# Patient Record
Sex: Female | Born: 1960
Health system: Southern US, Community
[De-identification: ages and names within clinical notes are randomized; demographics above are authoritative.]

## PROBLEM LIST (undated history)

## (undated) DIAGNOSIS — Z862 Personal history of diseases of the blood and blood-forming organs and certain disorders involving the immune mechanism: Secondary | ICD-10-CM

## (undated) DIAGNOSIS — K219 Gastro-esophageal reflux disease without esophagitis: Secondary | ICD-10-CM

## (undated) DIAGNOSIS — M1990 Unspecified osteoarthritis, unspecified site: Secondary | ICD-10-CM

## (undated) DIAGNOSIS — R7303 Prediabetes: Secondary | ICD-10-CM

## (undated) DIAGNOSIS — I10 Essential (primary) hypertension: Secondary | ICD-10-CM

## (undated) DIAGNOSIS — F4310 Post-traumatic stress disorder, unspecified: Secondary | ICD-10-CM

## (undated) DIAGNOSIS — I2510 Atherosclerotic heart disease of native coronary artery without angina pectoris: Secondary | ICD-10-CM

## (undated) DIAGNOSIS — I209 Angina pectoris, unspecified: Secondary | ICD-10-CM

## (undated) DIAGNOSIS — Z8669 Personal history of other diseases of the nervous system and sense organs: Secondary | ICD-10-CM

## (undated) DIAGNOSIS — M25569 Pain in unspecified knee: Secondary | ICD-10-CM

## (undated) DIAGNOSIS — Z8719 Personal history of other diseases of the digestive system: Secondary | ICD-10-CM

## (undated) DIAGNOSIS — F323 Major depressive disorder, single episode, severe with psychotic features: Secondary | ICD-10-CM

## (undated) DIAGNOSIS — M7542 Impingement syndrome of left shoulder: Secondary | ICD-10-CM

## (undated) DIAGNOSIS — D649 Anemia, unspecified: Secondary | ICD-10-CM

## (undated) DIAGNOSIS — E119 Type 2 diabetes mellitus without complications: Secondary | ICD-10-CM

## (undated) DIAGNOSIS — Z8614 Personal history of Methicillin resistant Staphylococcus aureus infection: Secondary | ICD-10-CM

## (undated) HISTORY — PX: UPPER GI ENDOSCOPY: SHX6162

## (undated) HISTORY — PX: SHOULDER SURGERY: SHX246

## (undated) HISTORY — PX: COLONOSCOPY: SHX174

## (undated) HISTORY — PX: JOINT REPLACEMENT: SHX530

## (undated) HISTORY — PX: CARDIAC CATHETERIZATION: SHX172

## (undated) HISTORY — PX: OTHER SURGICAL HISTORY: SHX169

## (undated) HISTORY — PX: ABDOMINAL HYSTERECTOMY: SHX81

## (undated) HISTORY — PX: TONSILLECTOMY AND ADENOIDECTOMY: SUR1326

---

## 1999-10-28 HISTORY — PX: TOTAL ABDOMINAL HYSTERECTOMY W/ BILATERAL SALPINGOOPHORECTOMY: SHX83

## 2004-08-21 ENCOUNTER — Emergency Department (HOSPITAL_COMMUNITY): Admission: EM | Admit: 2004-08-21 | Discharge: 2004-08-21 | Payer: Self-pay

## 2004-12-26 ENCOUNTER — Ambulatory Visit (HOSPITAL_COMMUNITY): Admission: RE | Admit: 2004-12-26 | Discharge: 2004-12-26 | Payer: Self-pay | Admitting: Internal Medicine

## 2005-01-09 ENCOUNTER — Emergency Department (HOSPITAL_COMMUNITY): Admission: EM | Admit: 2005-01-09 | Discharge: 2005-01-09 | Payer: Self-pay | Admitting: Emergency Medicine

## 2005-01-29 ENCOUNTER — Ambulatory Visit: Payer: Self-pay | Admitting: Infectious Diseases

## 2005-02-28 ENCOUNTER — Ambulatory Visit: Payer: Self-pay | Admitting: Infectious Diseases

## 2005-02-28 ENCOUNTER — Ambulatory Visit (HOSPITAL_COMMUNITY): Admission: RE | Admit: 2005-02-28 | Discharge: 2005-02-28 | Payer: Self-pay | Admitting: Family Medicine

## 2005-03-04 ENCOUNTER — Ambulatory Visit (HOSPITAL_COMMUNITY): Admission: RE | Admit: 2005-03-04 | Discharge: 2005-03-04 | Payer: Self-pay | Admitting: Infectious Diseases

## 2005-03-26 ENCOUNTER — Ambulatory Visit: Payer: Self-pay | Admitting: Infectious Diseases

## 2005-04-01 ENCOUNTER — Emergency Department (HOSPITAL_COMMUNITY): Admission: EM | Admit: 2005-04-01 | Discharge: 2005-04-01 | Payer: Self-pay | Admitting: Emergency Medicine

## 2005-06-29 ENCOUNTER — Emergency Department (HOSPITAL_COMMUNITY): Admission: EM | Admit: 2005-06-29 | Discharge: 2005-06-29 | Payer: Self-pay | Admitting: Emergency Medicine

## 2007-01-29 ENCOUNTER — Ambulatory Visit: Payer: Self-pay | Admitting: Internal Medicine

## 2007-02-05 ENCOUNTER — Ambulatory Visit: Payer: Self-pay | Admitting: Internal Medicine

## 2007-02-05 ENCOUNTER — Encounter: Payer: Self-pay | Admitting: Internal Medicine

## 2007-02-09 ENCOUNTER — Ambulatory Visit: Payer: Self-pay | Admitting: *Deleted

## 2007-02-10 ENCOUNTER — Ambulatory Visit (HOSPITAL_COMMUNITY): Admission: RE | Admit: 2007-02-10 | Discharge: 2007-02-10 | Payer: Self-pay | Admitting: Family Medicine

## 2007-02-12 ENCOUNTER — Ambulatory Visit: Payer: Self-pay | Admitting: Internal Medicine

## 2007-02-19 ENCOUNTER — Ambulatory Visit: Payer: Self-pay | Admitting: Internal Medicine

## 2007-02-25 ENCOUNTER — Ambulatory Visit: Payer: Self-pay | Admitting: Internal Medicine

## 2007-03-12 ENCOUNTER — Ambulatory Visit: Payer: Self-pay | Admitting: Internal Medicine

## 2007-03-26 ENCOUNTER — Ambulatory Visit: Payer: Self-pay | Admitting: Internal Medicine

## 2007-04-23 ENCOUNTER — Ambulatory Visit: Payer: Self-pay | Admitting: Internal Medicine

## 2007-05-04 ENCOUNTER — Ambulatory Visit: Payer: Self-pay | Admitting: Internal Medicine

## 2007-05-12 ENCOUNTER — Ambulatory Visit: Payer: Self-pay | Admitting: Family Medicine

## 2007-05-12 ENCOUNTER — Ambulatory Visit: Payer: Self-pay | Admitting: Internal Medicine

## 2007-05-18 ENCOUNTER — Emergency Department (HOSPITAL_COMMUNITY): Admission: EM | Admit: 2007-05-18 | Discharge: 2007-05-18 | Payer: Self-pay | Admitting: Emergency Medicine

## 2007-08-09 ENCOUNTER — Ambulatory Visit: Payer: Self-pay | Admitting: Internal Medicine

## 2007-08-11 ENCOUNTER — Ambulatory Visit: Payer: Self-pay | Admitting: Internal Medicine

## 2007-12-01 ENCOUNTER — Emergency Department (HOSPITAL_COMMUNITY): Admission: EM | Admit: 2007-12-01 | Discharge: 2007-12-01 | Payer: Self-pay | Admitting: Emergency Medicine

## 2008-01-16 ENCOUNTER — Inpatient Hospital Stay (HOSPITAL_COMMUNITY): Admission: EM | Admit: 2008-01-16 | Discharge: 2008-01-20 | Payer: Self-pay | Admitting: Emergency Medicine

## 2008-01-19 ENCOUNTER — Encounter: Payer: Self-pay | Admitting: Cardiology

## 2008-05-05 ENCOUNTER — Ambulatory Visit: Payer: Self-pay | Admitting: Internal Medicine

## 2008-05-05 LAB — CONVERTED CEMR LAB
ALT: 23 units/L (ref 0–35)
AST: 14 units/L (ref 0–37)
Albumin: 4.1 g/dL (ref 3.5–5.2)
Alkaline Phosphatase: 87 units/L (ref 39–117)
BUN: 16 mg/dL (ref 6–23)
Basophils Absolute: 0 10*3/uL (ref 0.0–0.1)
Basophils Relative: 0 % (ref 0–1)
CO2: 20 meq/L (ref 19–32)
Calcium: 9.2 mg/dL (ref 8.4–10.5)
Chloride: 106 meq/L (ref 96–112)
Cholesterol: 154 mg/dL (ref 0–200)
Creatinine, Ser: 1.06 mg/dL (ref 0.40–1.20)
Eosinophils Absolute: 0.1 10*3/uL (ref 0.0–0.7)
Eosinophils Relative: 3 % (ref 0–5)
Glucose, Bld: 110 mg/dL — ABNORMAL HIGH (ref 70–99)
HCT: 44.2 % (ref 36.0–46.0)
HDL: 41 mg/dL (ref >39)
Hemoglobin: 14.3 g/dL (ref 12.0–15.0)
LDL Cholesterol: 85 mg/dL (ref 0–99)
Lymphocytes Relative: 62 % — ABNORMAL HIGH (ref 12–46)
Lymphs Abs: 1.9 10*3/uL (ref 0.7–4.0)
MCHC: 32.4 g/dL (ref 30.0–36.0)
MCV: 87.2 fL (ref 78.0–100.0)
Monocytes Absolute: 0.3 10*3/uL (ref 0.1–1.0)
Monocytes Relative: 9 % (ref 3–12)
Neutro Abs: 0.8 10*3/uL — ABNORMAL LOW (ref 1.7–7.7)
Neutrophils Relative %: 26 % — ABNORMAL LOW (ref 43–77)
Platelets: 239 10*3/uL (ref 150–400)
Potassium: 4.2 meq/L (ref 3.5–5.3)
RBC: 5.07 M/uL (ref 3.87–5.11)
RDW: 13.7 % (ref 11.5–15.5)
Sodium: 141 meq/L (ref 135–145)
TSH: 2.035 microintl units/mL (ref 0.350–4.50)
Total Bilirubin: 0.4 mg/dL (ref 0.3–1.2)
Total CHOL/HDL Ratio: 3.8
Total Protein: 6.7 g/dL (ref 6.0–8.3)
Triglycerides: 140 mg/dL (ref <150)
VLDL: 28 mg/dL (ref 0–40)
WBC: 3.1 10*3/uL — ABNORMAL LOW (ref 4.0–10.5)

## 2008-05-06 ENCOUNTER — Ambulatory Visit: Payer: Self-pay | Admitting: Internal Medicine

## 2008-05-15 ENCOUNTER — Ambulatory Visit (HOSPITAL_COMMUNITY): Admission: RE | Admit: 2008-05-15 | Discharge: 2008-05-15 | Payer: Self-pay | Admitting: Family Medicine

## 2009-02-01 ENCOUNTER — Ambulatory Visit: Payer: Self-pay | Admitting: Internal Medicine

## 2009-02-01 LAB — CONVERTED CEMR LAB
ALT: 27 units/L (ref 0–35)
AST: 19 units/L (ref 0–37)
Albumin: 4.6 g/dL (ref 3.5–5.2)
Alkaline Phosphatase: 96 units/L (ref 39–117)
BUN: 11 mg/dL (ref 6–23)
CO2: 20 meq/L (ref 19–32)
Calcium: 9.1 mg/dL (ref 8.4–10.5)
Chloride: 109 meq/L (ref 96–112)
Cholesterol: 139 mg/dL (ref 0–200)
Creatinine, Ser: 1.09 mg/dL (ref 0.40–1.20)
Direct LDL: 90 mg/dL
Glucose, Bld: 91 mg/dL (ref 70–99)
HDL: 39 mg/dL — ABNORMAL LOW (ref >39)
LDL Cholesterol: 80 mg/dL (ref 0–99)
Potassium: 3.9 meq/L (ref 3.5–5.3)
Sodium: 144 meq/L (ref 135–145)
TSH: 1.669 microintl units/mL (ref 0.350–4.500)
Total Bilirubin: 0.4 mg/dL (ref 0.3–1.2)
Total CHOL/HDL Ratio: 3.6
Total Protein: 7 g/dL (ref 6.0–8.3)
Triglycerides: 98 mg/dL (ref <150)
VLDL: 20 mg/dL (ref 0–40)

## 2009-02-09 ENCOUNTER — Emergency Department (HOSPITAL_COMMUNITY): Admission: EM | Admit: 2009-02-09 | Discharge: 2009-02-09 | Payer: Self-pay | Admitting: Emergency Medicine

## 2009-03-07 ENCOUNTER — Encounter: Admission: RE | Admit: 2009-03-07 | Discharge: 2009-06-05 | Payer: Self-pay | Admitting: Internal Medicine

## 2009-03-28 ENCOUNTER — Ambulatory Visit: Payer: Self-pay | Admitting: Internal Medicine

## 2009-04-12 ENCOUNTER — Encounter: Admission: RE | Admit: 2009-04-12 | Discharge: 2009-07-11 | Payer: Self-pay | Admitting: Internal Medicine

## 2009-06-13 ENCOUNTER — Ambulatory Visit: Payer: Self-pay | Admitting: Internal Medicine

## 2009-06-19 ENCOUNTER — Ambulatory Visit (HOSPITAL_COMMUNITY): Admission: RE | Admit: 2009-06-19 | Discharge: 2009-06-19 | Payer: Self-pay | Admitting: Internal Medicine

## 2009-06-27 ENCOUNTER — Ambulatory Visit (HOSPITAL_COMMUNITY): Admission: RE | Admit: 2009-06-27 | Discharge: 2009-06-27 | Payer: Self-pay | Admitting: Internal Medicine

## 2009-07-04 ENCOUNTER — Encounter: Admission: RE | Admit: 2009-07-04 | Discharge: 2009-08-03 | Payer: Self-pay | Admitting: Orthopedic Surgery

## 2009-08-15 ENCOUNTER — Ambulatory Visit: Payer: Self-pay | Admitting: Internal Medicine

## 2009-08-18 ENCOUNTER — Emergency Department (HOSPITAL_COMMUNITY): Admission: EM | Admit: 2009-08-18 | Discharge: 2009-08-18 | Payer: Self-pay | Admitting: Emergency Medicine

## 2009-08-22 ENCOUNTER — Encounter: Admission: RE | Admit: 2009-08-22 | Discharge: 2009-08-22 | Payer: Self-pay | Admitting: Internal Medicine

## 2010-03-03 ENCOUNTER — Emergency Department (HOSPITAL_COMMUNITY): Admission: EM | Admit: 2010-03-03 | Discharge: 2010-03-03 | Payer: Self-pay | Admitting: Emergency Medicine

## 2010-03-13 ENCOUNTER — Encounter: Admission: RE | Admit: 2010-03-13 | Discharge: 2010-03-13 | Payer: Self-pay | Admitting: Internal Medicine

## 2010-05-31 ENCOUNTER — Encounter (INDEPENDENT_AMBULATORY_CARE_PROVIDER_SITE_OTHER): Payer: Self-pay | Admitting: Internal Medicine

## 2010-05-31 ENCOUNTER — Ambulatory Visit: Payer: Self-pay | Admitting: Family Medicine

## 2010-05-31 LAB — CONVERTED CEMR LAB
BUN: 12 mg/dL (ref 6–23)
CO2: 26 meq/L (ref 19–32)
Calcium: 9.1 mg/dL (ref 8.4–10.5)
Chloride: 107 meq/L (ref 96–112)
Cholesterol: 136 mg/dL (ref 0–200)
Creatinine, Ser: 1.14 mg/dL (ref 0.40–1.20)
Glucose, Bld: 108 mg/dL — ABNORMAL HIGH (ref 70–99)
HDL: 41 mg/dL (ref >39)
LDL Cholesterol: 82 mg/dL (ref 0–99)
Potassium: 4.5 meq/L (ref 3.5–5.3)
Sodium: 143 meq/L (ref 135–145)
TSH: 1.49 microintl units/mL (ref 0.350–4.500)
Total CHOL/HDL Ratio: 3.3
Triglycerides: 65 mg/dL (ref <150)
VLDL: 13 mg/dL (ref 0–40)

## 2010-06-07 ENCOUNTER — Encounter: Admission: RE | Admit: 2010-06-07 | Discharge: 2010-06-07 | Payer: Self-pay | Admitting: Internal Medicine

## 2010-06-20 ENCOUNTER — Ambulatory Visit (HOSPITAL_COMMUNITY): Admission: RE | Admit: 2010-06-20 | Discharge: 2010-06-20 | Payer: Self-pay | Admitting: Internal Medicine

## 2010-06-25 ENCOUNTER — Emergency Department (HOSPITAL_COMMUNITY): Admission: EM | Admit: 2010-06-25 | Discharge: 2010-06-25 | Payer: Self-pay | Admitting: Emergency Medicine

## 2010-09-03 ENCOUNTER — Encounter: Admission: RE | Admit: 2010-09-03 | Discharge: 2010-09-03 | Payer: Self-pay | Admitting: Internal Medicine

## 2010-11-17 ENCOUNTER — Emergency Department (HOSPITAL_COMMUNITY)
Admission: EM | Admit: 2010-11-17 | Discharge: 2010-11-17 | Payer: Self-pay | Source: Home / Self Care | Admitting: Emergency Medicine

## 2010-11-18 ENCOUNTER — Encounter: Payer: Self-pay | Admitting: Internal Medicine

## 2010-12-03 ENCOUNTER — Encounter (INDEPENDENT_AMBULATORY_CARE_PROVIDER_SITE_OTHER): Payer: Self-pay | Admitting: Family Medicine

## 2010-12-03 LAB — CONVERTED CEMR LAB
AST: 19 units/L (ref 0–37)
Albumin: 4.3 g/dL (ref 3.5–5.2)
BUN: 15 mg/dL (ref 6–23)
Calcium: 9.5 mg/dL (ref 8.4–10.5)
Chloride: 104 meq/L (ref 96–112)
Glucose, Bld: 134 mg/dL — ABNORMAL HIGH (ref 70–99)
Potassium: 4.3 meq/L (ref 3.5–5.3)
Sodium: 139 meq/L (ref 135–145)
Total Protein: 6.9 g/dL (ref 6.0–8.3)

## 2010-12-06 ENCOUNTER — Other Ambulatory Visit: Payer: Self-pay

## 2010-12-06 DIAGNOSIS — M545 Low back pain: Secondary | ICD-10-CM

## 2010-12-06 DIAGNOSIS — M47817 Spondylosis without myelopathy or radiculopathy, lumbosacral region: Secondary | ICD-10-CM

## 2010-12-06 DIAGNOSIS — G8929 Other chronic pain: Secondary | ICD-10-CM

## 2010-12-10 ENCOUNTER — Other Ambulatory Visit: Payer: Self-pay | Admitting: Family Medicine

## 2010-12-10 ENCOUNTER — Other Ambulatory Visit: Payer: Self-pay | Admitting: *Deleted

## 2010-12-10 ENCOUNTER — Ambulatory Visit
Admission: RE | Admit: 2010-12-10 | Discharge: 2010-12-10 | Disposition: A | Discharge status: Home / Self Care | Payer: Medicare Other | Source: Ambulatory Visit

## 2010-12-10 DIAGNOSIS — G8929 Other chronic pain: Secondary | ICD-10-CM

## 2010-12-10 DIAGNOSIS — M4307 Spondylolysis, lumbosacral region: Secondary | ICD-10-CM

## 2010-12-10 DIAGNOSIS — M47817 Spondylosis without myelopathy or radiculopathy, lumbosacral region: Secondary | ICD-10-CM

## 2010-12-10 DIAGNOSIS — M545 Low back pain: Secondary | ICD-10-CM

## 2010-12-24 ENCOUNTER — Ambulatory Visit
Admission: RE | Admit: 2010-12-24 | Discharge: 2010-12-24 | Disposition: A | Discharge status: Home / Self Care | Payer: Medicare Other | Source: Ambulatory Visit | Attending: Family Medicine | Admitting: Family Medicine

## 2010-12-24 DIAGNOSIS — M549 Dorsalgia, unspecified: Secondary | ICD-10-CM

## 2010-12-24 DIAGNOSIS — G8929 Other chronic pain: Secondary | ICD-10-CM

## 2010-12-24 DIAGNOSIS — M4307 Spondylolysis, lumbosacral region: Secondary | ICD-10-CM

## 2011-01-14 LAB — URINALYSIS, ROUTINE W REFLEX MICROSCOPIC
Bilirubin Urine: NEGATIVE
Hgb urine dipstick: NEGATIVE
Ketones, ur: NEGATIVE mg/dL
Nitrite: NEGATIVE
Protein, ur: NEGATIVE mg/dL
Specific Gravity, Urine: 1.017 (ref 1.005–1.030)
Urobilinogen, UA: 0.2 mg/dL (ref 0.0–1.0)

## 2011-01-14 LAB — POCT I-STAT, CHEM 8
BUN: 12 mg/dL (ref 6–23)
Chloride: 107 mEq/L (ref 96–112)
Potassium: 3.5 mEq/L (ref 3.5–5.1)
Sodium: 143 mEq/L (ref 135–145)

## 2011-01-14 LAB — URINE MICROSCOPIC-ADD ON

## 2011-01-14 LAB — POCT CARDIAC MARKERS
CKMB, poc: <1 ng/mL — ABNORMAL LOW (ref 1.0–8.0)
Myoglobin, poc: 92.3 ng/mL (ref 12–200)
Troponin i, poc: <0.05 ng/mL (ref 0.00–0.09)

## 2011-01-14 LAB — CBC
Platelets: 263 10*3/uL (ref 150–400)
RBC: 4.71 MIL/uL (ref 3.87–5.11)
WBC: 3.7 10*3/uL — ABNORMAL LOW (ref 4.0–10.5)

## 2011-01-14 LAB — DIFFERENTIAL
Lymphocytes Relative: 57 % — ABNORMAL HIGH (ref 12–46)
Lymphs Abs: 2.1 10*3/uL (ref 0.7–4.0)
Neutrophils Relative %: 30 % — ABNORMAL LOW (ref 43–77)

## 2011-01-14 LAB — POCT PREGNANCY, URINE: Preg Test, Ur: NEGATIVE

## 2011-03-11 NOTE — H&P (Signed)
NAMEMAYUMI, SUMMERSON                 ACCOUNT NO.:  [id number]   MEDICAL RECORD NO.:  [id number]          PATIENT TYPE:  INP   LOCATION:  0111                         FACILITY:  [facility]   PHYSICIAN:  Mobolaji B. Bakare, M.D.DATE OF BIRTH:  [birth year]   DATE OF ADMISSION:  01/16/2008  DATE OF DISCHARGE:                              HISTORY & PHYSICAL   PRIMARY CARE PHYSICIAN:  Dr. Reche Dixon with [facility].   PSYCHIATRIST:  Dr. Hortencia Pilar.   CHIEF COMPLAINT:  Chest pain.   HISTORY OF PRESENTING COMPLAINT:  Ms. Sisler is a 50 year old African  American female with history of hypertension, depression and  gastroesophageal reflux disease.  She was in her usual state of health  until last night when she developed left-sided chest pain.  It was  intermittent, not associated with diaphoresis, palpitation, nausea or  vomiting.  However, she expressed some shortness of breath.  She was  able to go to bed without difficulty.  Again, she woke up this morning  with similar pain, now radiating to her left shoulder and her left arm.  No associated nausea or vomiting or palpitation, diaphoresis.  She  decided to come to the emergency room.  The chest pains were not related  to exertion.  The patient was seen in the emergency room and had initial  EKG which showed normal sinus rhythm without acute ST abnormality.  She  has had two sets of cardiac markers at the point-of-care which were  normal.  She is being admitted to rule out MI.   REVIEW OF SYSTEMS:  She denies headaches, chills, fever, cough.  No  abdominal pain, nausea, vomiting or diarrhea.  She does have  constipation due to her medications.  She has no dysuria or urgency or  increased frequency of micturition.   PAST MEDICAL HISTORY:  1. Hypertension.  2. Gastroesophageal reflux disease.  3. Depression with psychotic features.  4. Chronic constipation.   PAST SURGICAL HISTORY:  1. Carpal tunnel surgery.  2. Hysterectomy.  3.  Tonsillectomy.   CURRENT MEDICATIONS:  1. Wellbutrin 150 mg p.o. b.i.d.  2. Hydrochlorothiazide 25 mg half a tablet daily.  3. Protonix 40 mg daily.  4. Pepto-Bismol p.r.n.  5. Abilify 20 mg daily.  6. Haloperidol 2 mg q.h.s.   ALLERGIES:  No known drug allergies.   FAMILY HISTORY:  Father has passed away.  He had lung cancer.  There is  a history of MI in her father in his mid 12s.  The patient could not  give much details.  Mother had irregular heart and there is family  history of diabetes mellitus in grandmother.  Same grandmother had MI in  her 34s.   SOCIAL HISTORY:  The patient occasionally drinks alcohol.  She smokes  about three to four sticks of cigarettes a day.  She is currently  unemployed.   PHYSICAL EXAMINATION:  INITIAL VITALS:  Temperature 97.9, blood pressure  101/46, pulse 89 and respiratory rate of 88.  The patient is comfortable, not in respiratory distress.  Normocephalic,  atraumatic head.  She is morbidly obese.  Not in  respiratory distress.  Not pale, anicteric.  No elevated JVD.  No carotid bruit.  LUNGS:  Clear clinically to auscultation.  CVS:  S1, S2, regular.  Abdomen:  Not distended, nontender.  Bowel sounds present.  No palpable  organomegaly.  EXTREMITIES:  No pedal edema or calf tenderness.  Dorsalis pedis pulses  palpable bilaterally.  CNS:  No focal neurological deficit.  CHEST WALL:  There is no reproducible chest wall tenderness.   Cardiac markers at the point-of-care x3 sets were normal with myoglobin  30, 48, 37.  Troponin less than 0.05.  Sodium 139, potassium 3.6,  chloride 105, CO2 27, glucose 128, BUN 15, creatinine 1.15, calcium 9.1,  D-dimer less than 0.22.  White cells 4.6, hemoglobin 13.3, hematocrit  39.1, platelets 267, neutrophils 37%, lymphocytes 52%, monocytes 6%.  Absolute neutrophil count is normal 1.7.   EKG:  Normal sinus rhythm with a heart rate of 67, nonspecific ST  abnormality in the lateral leads.   Chest  x-ray:  Chest x-ray shows no acute cardiopulmonary disease.   ASSESSMENT AND PLAN:  1. Ms. [name] is a 50 year old African American female presenting with      atypical chest pain, no acute ST changes on EKG, three sets of      cardiac markers at the point-of-care are normal.  She has      cardiovascular risk factors of ongoing tobacco abuse, hypertension,      probable family history of premature coronary artery disease,      morbid obesity.  She is currently chest pain free.  She received 4      mg of morphine in the emergency room.  D-dimer is normal.  The      patient will be admitted to rule out MI.  Will check fasting lipid      profile, hemoglobin A1c of cardiac enzymes times and negative.      Will arrange a stress test.  The patient will be  started on      aspirin.  2. Hypertension.  Will continue with hydrochlorothiazide 12.5 mg      daily.  3. Depression with psychotic features.  Will continue Abilify, Haldol      and Wellbutrin.  4. Morbid obesity.  Will offer weight loss counseling.  5. Tobacco abuse.  Will offer tobacco cessation counseling.      Mobolaji B. Corky Downs, M.D.  Electronically Signed     MBB/MEDQ  D:  01/16/2008  T:  01/16/2008  Job:  [id number]   cc:   Dineen Kid. Reche Dixon, M.D.  Fax: [phone]

## 2011-03-11 NOTE — Consult Note (Signed)
[name], [name]                 ACCOUNT NO.:  [id number]   MEDICAL RECORD NO.:  [id number]          PATIENT TYPE:  INP   LOCATION:  1403                         FACILITY:  [facility]   PHYSICIAN:  Jake Bathe, MD      DATE OF BIRTH:  1961-03-16   DATE OF CONSULTATION:  01/17/2008  DATE OF DISCHARGE:                                 CONSULTATION   REFERRING PHYSICIAN:  Mobolaji B. Corky Downs, M.D. with [facility].   PRIMARY CARE PHYSICIAN:  Dineen Kid. Reche Dixon, M.D., [facility].   REASON FOR CONSULTATION:  [name] is being seen at the request of Dr.  Corky Downs for evaluation of chest pain.   HISTORY OF PRESENT ILLNESS:  A 50 year old African American female with  obesity, early family history of coronary artery disease with her father  having a myocardial infarction in his 7s, tobacco abuse, hypertension,  and depression with psychotic features who on Saturday began having  sharp left sided chest pain which radiated to her left arm, lasting off  and on all day associated with mild shortness of breath.  She woke up on  Sunday with increased severity of the pain and took Maalox and Pepcid  with no relief.  She came to [facility] Emergency Department.  Nitroglycerin helped relieve pain.  She has had heartburn in the past  and this pain felt different.  She denies palpitations, syncope,  diaphoresis, orthopnea, PND, bleeding problems.  She has not had  psychosis in quite some time according to the patient.  Currently, she  is chest pain free resting comfortably in her bed.   PAST MEDICAL HISTORY:  1. Obesity.  2. GERD.  3. Depression with psychotic features on Abilify and Haldol as well as      Wellbutrin.  4. Hypertension on hydrochlorothiazide.  5. Chronic constipation.  6. Carpal tunnel surgery.  7. Hysterectomy.  8. Tonsillectomy.   No prior cholecystectomy.   ALLERGIES:  No known drug allergies.   HOME MEDICATIONS:  1. Wellbutrin 150 mg twice a day.  2.  Hydrochlorothiazide 12.5 mg a day.  3. Protonix 40 mg a day.  4. Pepto-Bismol p.r.n.  5. Abilify 20 mg a day.  6. Haldol 2 mg at night.   Here she received,  1. Full dose Lovenox which was transitioned to DVT prophylaxis dose.  2. Aspirin 325 mg a day.   SOCIAL HISTORY:  She smokes a few cigarettes daily.  No alcohol.  She is  unemployed, applying for disability.   FAMILY HISTORY:  Father myocardial infarction in his 55s.   REVIEW OF SYSTEMS:  Unless explained above, all other 12 review of  systems negative.   PHYSICAL EXAMINATION:  VITAL SIGNS:  Blood pressure 103/56, pulse 88,  respirations 18, temperature 98.6, sating 96% on room air.  GENERAL:  Alert and oriented x3 in no acute distress, resting  comfortably in bed with her sister at her side.  EYES:  Well perfused conjunctivae.  EOMI.  No scleral icterus.  NECK:  Thick, no carotid bruits, no JVD.  CARDIOVASCULAR:  Regular rate and rhythm.  No murmurs, rubs, or gallops.  LUNGS:  Clear to auscultation bilaterally.  Normal respiratory effort.  ABDOMEN:  Soft, nontender.  Normoactive bowel sounds.  Obese.  No  bruits.  EXTREMITIES:  Trace edema bilateral lower extremities with normal pulses  distally.  NEUROLOGIC:  Nonfocal, slightly depressed mood, no tremors.  SKIN:  Warm, dry, and intact.  No rashes.  MUSCULOSKELETAL:  Nontender chest wall to palpation.   DATA:  EKG shows sinus rhythm, nonspecific ST-T wave changes, rate 87.  No significant change from prior ECG dated yesterday at 1:52 p.m.  No Q  waves.   LABORATORY:  White count 4.6, hemoglobin 13.3, platelets 267.  Sodium  139, potassium 3.5, BUN 15, creatinine 1.2, glucose 138.  TSH 2.8  normal.  Cardiac biomarkers negative x3.  Total cholesterol 177, LDL  123, HDL 23, triglycerides 153.   Prior medical records reviewed.  Chest x-ray, personally viewed, showed  no acute airspace disease.   ASSESSMENT:  A 50 year old [place] American female with morbid obesity,   depression, gastroesophageal reflux disease, hypertension, family  history of coronary artery disease, tobacco abuse with chest pain.   PLAN:  1. Chest pain -possible ACS.  EKG and cardiac biomarkers reassuring.      No evidence of myocardial infarction.  Given her multitude of risk      factors, we will proceed with 2-day stress Cardiolite to be      performed at [facility] for further risk stratification.      NPO tonight.  2. Tobacco use.  Counseled on cessation.  3. Hypertension.  Well controlled on hydrochlorothiazide.  4. Obesity.  Counseled on weight loss.  5. Depression on Haldol, Abilify, and Wellbutrin.  Psychosis well      controlled.  6. Cholesterol.  Low HDL, LDL 123.  Await nuclear stress test for      further recommendations.   We will follow up with result of study.  Continue aspirin.      Jake Bathe, MD  Electronically Signed     MCS/MEDQ  D:  01/17/2008  T:  01/17/2008  Job:  [id number]   cc:   Mobolaji B. Corky Downs, M.D.   Dineen Kid Reche Dixon, M.D.  Fax: [phone]

## 2011-03-11 NOTE — Cardiovascular Report (Signed)
NAMESELENIA, MIHOK NO.:  [id number]   MEDICAL RECORD NO.:  [id number]          PATIENT TYPE:  OUT   LOCATION:  CATH                         FACILITY:  [facility]   PHYSICIAN:  Jake Bathe, MD      DATE OF BIRTH:  09/05/1961   DATE OF PROCEDURE:  01/20/2008  DATE OF DISCHARGE:  01/20/2008                            CARDIAC CATHETERIZATION   INDICATIONS:  A 50 year old female with obesity, prediabetes,  hyperlipidemia, who had chest pain relieved with nitroglycerin, and  abnormal nuclear stress test showing an anterolateral defect concerning  for ischemia.   PROCEDURES:  1. Left heart catheterization.  2. Selective coronary angiography.  3. Left ventriculogram.   PROCEDURE DETAILS:  Informed consent was obtained.  Risks of stroke,  heart attack, death, bleeding, damage to femoral artery, and renal  impairment were explained to both the patient and her sister.  She was  placed on the catheterization table, prepped in a sterile fashion, using  1% lidocaine for local anesthesia.  After femoral head visualization  with fluoroscopy, the modified Seldinger technique was used to place a 5-  French sheath in the right femoral artery.  A Judkins left #4 catheter  was then used to selectively cannulate the left main artery and multiple  views with Omnipaque were obtained.  This catheter was exchanged for a  Judkins right #4 catheter, it was used to selectively cannulate the  right coronary artery and multiple views of Omnipaque were obtained.  Nitroglycerin 200 mcg intracoronary were administered after the first 2  right coronary shots.  This catheter was then exchanged for an angled  pigtail, which was used to cross the aortic valve and placed in the left  ventricle.  Hemodynamics obtained.  In the RAO position, a left  ventriculogram with power injection was used, 30 mL of dye.  Aortic  valve pullback was then obtained.  The patient tolerated procedure well.  No  hematoma.  Manual compression was held to the groin.  Findings  discussed with both the patient and her family.   FINDINGS:  1. Left main artery - no significant disease.  2. Left anterior descending artery - there is one high diagonal      branch, small to moderate size in caliber.  Minor irregularities      throughout LAD, wraps around apex.  3. Circumflex artery - 3 obtuse marginal branches of moderate size,      minor irregularities throughout circumflex system.  4. Right coronary artery - there is a proximal 40% to 50% stenosis as      well as diffuse distal disease up to 30%.  This is the dominant      vessel giving rise to the PDA.  After nitroglycerin, the proximal      right coronary lesion approximated 40% to 50%.  5. Left ventriculogram.  The patient's ejection fraction was estimated      at 60% with no wall motion abnormalities, no significant mitral      regurgitation.  6. Hemodynamics:  Left ventricle systolic pressure 115, end-diastolic  pressure 8 mmHg, aortic pressure 115/77 with a mean of 94 mmHg.  No      significant gradient.   IMPRESSION:  1. Mild coronary artery disease, most notably in the right coronary      artery with up to 50% proximal stenosis and diffuse distal      irregularities up to 30%.  2. Normal left ventricular ejection fraction with no wall motion      abnormality.   PLAN:  Aggressive risk factor modification/medical management, agreed  with statin and aspirin.  We will place on low-dose amlodipine in case  her chest pain was a vasospastic event.  We will see back in followup in  2 weeks.      Jake Bathe, MD  Electronically Signed     MCS/MEDQ  D:  01/20/2008  T:  01/20/2008  Job:  [id number]

## 2011-03-14 NOTE — Discharge Summary (Signed)
NAMECLYDENE, [name]                 ACCOUNT NO.:  [id number]   MEDICAL RECORD NO.:  [id number]         PATIENT TYPE:  LINP   LOCATION:                               FACILITY:  [facility]   PHYSICIAN:  Beckey Rutter, MD  DATE OF BIRTH:  1961-04-09   DATE OF ADMISSION:  01/16/2008  DATE OF DISCHARGE:  01/20/2008                               DISCHARGE SUMMARY   PRIMARY CARE PHYSICIAN:  Dr. Reche Dixon with [facility].   CHIEF COMPLAINT:  Chest pain.   HISTORY OF PRESENT ILLNESS:  Briefly, a 50 year old African-American  female presented with chest pain.   HOSPITAL COURSE:  The patient had evaluation by cardiology service for  stratification.  She had undergone cardiac cath catheterization January 20, 2008.  Please refer to the result below.  The patient was discharged  on aggressive medical management.   DISCHARGE MEDICATIONS:  1. Norvasc 5 mg p.o. daily.  2. Aspirin 81 mg p.o. daily.  3. Zocor 40 mg p.o. q.h.s.  4. Abilify 20 mg p.o. daily.  5. Wellbutrin SR 150 mg p.o. daily.  6. Haldol 2 mg p.o. q.h.s.  7. Note, medication amlodipine and Zocor were added to her medication      list.   PROCEDURE:  Cardiac catheterization done by Dr. Anne Fu on January 20, 2008  impression:  Revealed mild coronary artery disease most notably in the  right coronary artery with up to 50% proximal stenosis and diffuse  distal irregularities up to 30%.  Normal left ventricular ejection  fraction with no wall motion abnormalities.   DISCHARGE DIAGNOSES:  1. Mild coronary artery disease as discussed above.  2. Hypertension.  3. Hyperlipidemia.  4. Depression.  5. History of gastroesophageal reflux disease.  6. History of chronic constipation.   DISCHARGE PLAN:  The patient was discharged after the cardiac cath.  She  was stable for discharge to follow up with her primary physician as  discussed with her.  She is aware and agreeable to the discharge plan.      Beckey Rutter, MD  Electronically Signed     EME/MEDQ  D:  02/16/2008  T:  02/16/2008  Job:  [id number]

## 2011-06-16 ENCOUNTER — Other Ambulatory Visit (HOSPITAL_COMMUNITY): Payer: Self-pay | Admitting: Family Medicine

## 2011-06-16 DIAGNOSIS — Z1231 Encounter for screening mammogram for malignant neoplasm of breast: Secondary | ICD-10-CM

## 2011-06-24 ENCOUNTER — Ambulatory Visit (HOSPITAL_COMMUNITY)
Admission: RE | Admit: 2011-06-24 | Discharge: 2011-06-24 | Disposition: A | Discharge status: Home / Self Care | Payer: Medicare Other | Source: Ambulatory Visit | Attending: Family Medicine | Admitting: Family Medicine

## 2011-06-24 DIAGNOSIS — Z1231 Encounter for screening mammogram for malignant neoplasm of breast: Secondary | ICD-10-CM | POA: Insufficient documentation

## 2011-06-26 ENCOUNTER — Other Ambulatory Visit: Payer: Self-pay | Admitting: Family Medicine

## 2011-06-26 DIAGNOSIS — R928 Other abnormal and inconclusive findings on diagnostic imaging of breast: Secondary | ICD-10-CM

## 2011-07-01 ENCOUNTER — Other Ambulatory Visit: Payer: Self-pay | Admitting: Family Medicine

## 2011-07-01 DIAGNOSIS — R928 Other abnormal and inconclusive findings on diagnostic imaging of breast: Secondary | ICD-10-CM

## 2011-07-03 ENCOUNTER — Ambulatory Visit
Admission: RE | Admit: 2011-07-03 | Discharge: 2011-07-03 | Disposition: A | Discharge status: Home / Self Care | Payer: Medicare Other | Source: Ambulatory Visit | Attending: Family Medicine | Admitting: Family Medicine

## 2011-07-03 DIAGNOSIS — R928 Other abnormal and inconclusive findings on diagnostic imaging of breast: Secondary | ICD-10-CM

## 2011-07-16 ENCOUNTER — Emergency Department (HOSPITAL_COMMUNITY): Payer: Medicare Other

## 2011-07-16 ENCOUNTER — Emergency Department (HOSPITAL_COMMUNITY)
Admission: EM | Admit: 2011-07-16 | Discharge: 2011-07-16 | Disposition: A | Discharge status: Home / Self Care | Payer: Medicare Other | Attending: Emergency Medicine | Admitting: Emergency Medicine

## 2011-07-16 DIAGNOSIS — I10 Essential (primary) hypertension: Secondary | ICD-10-CM | POA: Insufficient documentation

## 2011-07-16 DIAGNOSIS — R079 Chest pain, unspecified: Secondary | ICD-10-CM | POA: Insufficient documentation

## 2011-07-16 DIAGNOSIS — M25569 Pain in unspecified knee: Secondary | ICD-10-CM | POA: Insufficient documentation

## 2011-07-16 DIAGNOSIS — E669 Obesity, unspecified: Secondary | ICD-10-CM | POA: Insufficient documentation

## 2011-07-18 LAB — DIFFERENTIAL
Blasts: 0
Lymphocytes Relative: 49 — ABNORMAL HIGH
Neutrophils Relative %: 39 — ABNORMAL LOW
Promyelocytes Absolute: 0
nRBC: 0

## 2011-07-18 LAB — LIPASE, BLOOD: Lipase: 16

## 2011-07-18 LAB — COMPREHENSIVE METABOLIC PANEL
Alkaline Phosphatase: 80
BUN: 13
GFR calc non Af Amer: 49 — ABNORMAL LOW
Glucose, Bld: 112 — ABNORMAL HIGH
Potassium: 3.9
Total Bilirubin: 0.8
Total Protein: 7.3

## 2011-07-18 LAB — CBC
HCT: 42.1
Hemoglobin: 14.5
RDW: 14

## 2011-07-18 LAB — URINALYSIS, ROUTINE W REFLEX MICROSCOPIC
Glucose, UA: NEGATIVE
Ketones, ur: NEGATIVE
pH: 6

## 2011-07-21 LAB — DIFFERENTIAL
Basophils Absolute: 0
Eosinophils Absolute: 0.2
Lymphocytes Relative: 52 — ABNORMAL HIGH
Lymphs Abs: 2.4
Neutrophils Relative %: 37 — ABNORMAL LOW

## 2011-07-21 LAB — CBC
Platelets: 267
RBC: 4.36
WBC: 4.6
WBC: 5.2

## 2011-07-21 LAB — POCT CARDIAC MARKERS
CKMB, poc: <1 — ABNORMAL LOW
CKMB, poc: <1 — ABNORMAL LOW
Myoglobin, poc: 37.1
Operator id: 4074
Troponin i, poc: <0.05
Troponin i, poc: <0.05
Troponin i, poc: <0.05

## 2011-07-21 LAB — LIPID PANEL
Triglycerides: 153 — ABNORMAL HIGH
VLDL: 31

## 2011-07-21 LAB — PROTIME-INR
INR: 1
Prothrombin Time: 13

## 2011-07-21 LAB — TROPONIN I
Troponin I: 0.01
Troponin I: <0.01

## 2011-07-21 LAB — BASIC METABOLIC PANEL
BUN: 15
BUN: 15
CO2: 27
Calcium: 8.4
Calcium: 9.1
Chloride: 105
Creatinine, Ser: 1.15
Creatinine, Ser: 1.17
Creatinine, Ser: 1.19
GFR calc Af Amer: 59 — ABNORMAL LOW
GFR calc non Af Amer: 51 — ABNORMAL LOW
Glucose, Bld: 138 — ABNORMAL HIGH

## 2011-07-21 LAB — CK TOTAL AND CKMB (NOT AT ARMC)
CK, MB: 0.3
Relative Index: INVALID
Relative Index: INVALID
Total CK: 63
Total CK: 68

## 2011-07-21 LAB — URINALYSIS, ROUTINE W REFLEX MICROSCOPIC
Bilirubin Urine: NEGATIVE
Nitrite: NEGATIVE
Specific Gravity, Urine: 1.013
pH: 6

## 2011-07-21 LAB — HEMOGLOBIN A1C
Hgb A1c MFr Bld: 6.7 — ABNORMAL HIGH
Mean Plasma Glucose: 161

## 2011-07-21 LAB — D-DIMER, QUANTITATIVE: D-Dimer, Quant: <0.22

## 2011-07-21 LAB — GLUCOSE, RANDOM: Glucose, Bld: 147 — ABNORMAL HIGH

## 2011-07-21 LAB — APTT: aPTT: 31

## 2011-08-11 LAB — POCT CARDIAC MARKERS: Troponin i, poc: <0.05

## 2011-08-11 LAB — COMPREHENSIVE METABOLIC PANEL
ALT: 28
AST: 26
CO2: 27
Chloride: 105
GFR calc Af Amer: >60
GFR calc non Af Amer: 58 — ABNORMAL LOW
Sodium: 140
Total Bilirubin: 0.6

## 2011-08-11 LAB — DIFFERENTIAL
Basophils Absolute: 0.1
Eosinophils Absolute: 0.2
Eosinophils Relative: 3
Lymphs Abs: 2.7

## 2011-08-11 LAB — LIPASE, BLOOD: Lipase: 17

## 2011-08-11 LAB — CBC
RBC: 4.22
WBC: 6.1

## 2011-11-08 ENCOUNTER — Emergency Department (HOSPITAL_COMMUNITY)
Admission: EM | Admit: 2011-11-08 | Discharge: 2011-11-08 | Disposition: A | Discharge status: Home / Self Care | Payer: Medicare Other | Attending: Emergency Medicine | Admitting: Emergency Medicine

## 2011-11-08 ENCOUNTER — Encounter (HOSPITAL_COMMUNITY): Payer: Self-pay

## 2011-11-08 DIAGNOSIS — M25512 Pain in left shoulder: Secondary | ICD-10-CM

## 2011-11-08 DIAGNOSIS — E119 Type 2 diabetes mellitus without complications: Secondary | ICD-10-CM | POA: Insufficient documentation

## 2011-11-08 DIAGNOSIS — I10 Essential (primary) hypertension: Secondary | ICD-10-CM | POA: Insufficient documentation

## 2011-11-08 DIAGNOSIS — M25519 Pain in unspecified shoulder: Secondary | ICD-10-CM | POA: Insufficient documentation

## 2011-11-08 HISTORY — DX: Essential (primary) hypertension: I10

## 2011-11-08 MED ORDER — KETOROLAC TROMETHAMINE 60 MG/2ML IM SOLN
60.0000 mg | Freq: Once | INTRAMUSCULAR | Status: AC
  Administered 2011-11-08: 60 mg via INTRAMUSCULAR
  Filled 2011-11-08: qty 2

## 2011-11-08 MED ORDER — ACETAMINOPHEN-CODEINE #3 300-30 MG PO TABS: 1.0000 | ORAL_TABLET | Freq: Four times a day (QID) | ORAL | Status: AC | PRN

## 2011-11-08 NOTE — ED Notes (Signed)
Pt c/o left shoulder pain for a while (months), with acute exarbation x 2 days. Denied any injury. Gradual onset, constant dull aching pain. Limited ROM

## 2011-11-08 NOTE — ED Provider Notes (Signed)
History     CSN: [id number]  Arrival date & time 11/08/11  1405   First MD Initiated Contact with Patient 11/08/11 1545      Chief Complaint  Patient presents with  . Shoulder Pain    (Consider location/radiation/quality/duration/timing/severity/associated sxs/prior treatment) Patient is a 51 y.o. female presenting with shoulder pain. The history is provided by the patient.  Shoulder Pain This is a chronic problem. The current episode started more than 1 year ago. The problem occurs intermittently. The problem has been gradually worsening. Pertinent negatives include no chest pain, chills, fever, headaches, joint swelling, neck pain, numbness or weakness. Exacerbated by: overhead movement. She has tried acetaminophen and NSAIDs for the symptoms. The treatment provided moderate relief.  Shoulder Pain This is a chronic problem. The current episode started more than 1 year ago. The problem occurs intermittently. The problem has been gradually worsening. Pertinent negatives include no chest pain and no headaches. Exacerbated by: overhead movement. She has tried acetaminophen and NSAIDs for the symptoms. The treatment provided moderate relief.  Chronic left shoulder pain with NKI. Saw ortho approx 1 year ago, had x-ray performed, and steroid injection into shoulder with subsequent pain relief for several months. Over the last few months, gradually increasing pain to same shoulder with no new injury. Initially, over-the-counter medications or effective in transient relief of pain but recently, they have not been effective.  Past Medical History  Diagnosis Date  . Diabetes mellitus   . Hypertension     Past Surgical History  Procedure Date  . Tonsillectomy     No family history on file.  History  Substance Use Topics  . Smoking status: Never Smoker   . Smokeless tobacco: Not on file  . Alcohol Use: No     Review of Systems  Constitutional: Negative for fever and chills.  HENT:  Negative for neck pain.   Cardiovascular: Negative for chest pain.  Musculoskeletal: Negative for back pain and joint swelling.       See history of present illness  Neurological: Negative for weakness, numbness and headaches.    Allergies  Review of patient's allergies indicates no known allergies.  Home Medications  No current outpatient prescriptions on file.  BP 91/62  Pulse 88  Temp(Src) 98.6 F (37 C) (Oral)  Resp 18  SpO2 99%  Physical Exam  Nursing note and vitals reviewed. Constitutional: She is oriented to person, place, and time. She appears well-developed and well-nourished. No distress.  HENT:  Head: Normocephalic and atraumatic.  Right Ear: External ear normal.  Left Ear: External ear normal.  Nose: Nose normal.  Eyes: EOM are normal. Pupils are equal, round, and reactive to light.  Neck: Normal range of motion. Neck supple.  Cardiovascular: Normal rate, regular rhythm and intact distal pulses.   Pulmonary/Chest: Effort normal. No respiratory distress.  Abdominal: Soft. She exhibits no distension. There is no tenderness.  Musculoskeletal: She exhibits no edema.       Left shoulder: She exhibits decreased range of motion and tenderness. She exhibits no swelling, no effusion, no crepitus, no deformity, no laceration and normal pulse.       Left ear with pain over a.c. Joint. Isolated supraspinatus, infraspinatus, teres minor, subscapularis strength all 4+ out of 5 on the left, 5 out of 5 on the right. Positive Neer's test at 90 of shoulder flexion on the left. Full range of motion without pain to bilateral elbows, wrists, all digits of hands. Capillary refill less than 2 seconds  Neurological: She is alert and oriented to person, place, and time. Coordination normal.       Sensation intact to light touch. Grip strength 5 out of 5 bilaterally.  Skin: Skin is warm and dry. No rash noted.  Psychiatric: Her behavior is normal.    ED Course  Procedures (including  critical care time)     Diagnosis 1 shoulder pain, left   MDM  4:13 PM Patient seen and evaluated. Chronic pain with no new injury. Taking OTC meds without much relief. Toradol shot ordered. Will give Rx for tylenol #3 and have advised f/u with Dr Shelle Iron, who has seen in the past for same problem. No x-ray indicated.  Medical screening examination/treatment/procedure(s) were performed by non-physician practitioner and as supervising physician I was immediately available for consultation/collaboration. Osvaldo Human, M.D.      [address] [place], [place] 11/08/11 1633  Carleene Cooper III, MD 11/09/11 1235

## 2011-11-08 NOTE — ED Notes (Signed)
Left shoulder pain.

## 2011-11-08 NOTE — ED Notes (Signed)
Pt in with shoulder pain for several days denies recent injury states pain when lifting arm no relief with otc medications

## 2012-01-29 ENCOUNTER — Other Ambulatory Visit: Payer: Self-pay

## 2012-01-29 ENCOUNTER — Encounter (HOSPITAL_COMMUNITY): Payer: Self-pay | Admitting: Emergency Medicine

## 2012-01-29 ENCOUNTER — Emergency Department (HOSPITAL_COMMUNITY)
Admission: EM | Admit: 2012-01-29 | Discharge: 2012-01-29 | Disposition: A | Discharge status: Home Health | Payer: Medicare Other | Attending: Emergency Medicine | Admitting: Emergency Medicine

## 2012-01-29 ENCOUNTER — Emergency Department (HOSPITAL_COMMUNITY): Payer: Medicare Other

## 2012-01-29 DIAGNOSIS — Z79899 Other long term (current) drug therapy: Secondary | ICD-10-CM | POA: Insufficient documentation

## 2012-01-29 DIAGNOSIS — R5381 Other malaise: Secondary | ICD-10-CM | POA: Insufficient documentation

## 2012-01-29 DIAGNOSIS — E119 Type 2 diabetes mellitus without complications: Secondary | ICD-10-CM | POA: Insufficient documentation

## 2012-01-29 DIAGNOSIS — R42 Dizziness and giddiness: Secondary | ICD-10-CM | POA: Insufficient documentation

## 2012-01-29 DIAGNOSIS — R1013 Epigastric pain: Secondary | ICD-10-CM | POA: Insufficient documentation

## 2012-01-29 DIAGNOSIS — I10 Essential (primary) hypertension: Secondary | ICD-10-CM | POA: Insufficient documentation

## 2012-01-29 DIAGNOSIS — R079 Chest pain, unspecified: Secondary | ICD-10-CM | POA: Insufficient documentation

## 2012-01-29 LAB — BASIC METABOLIC PANEL
CO2: 27 mEq/L (ref 19–32)
Calcium: 9.1 mg/dL (ref 8.4–10.5)
Chloride: 102 mEq/L (ref 96–112)
Sodium: 141 mEq/L (ref 135–145)

## 2012-01-29 LAB — DIFFERENTIAL
Basophils Absolute: 0 10*3/uL (ref 0.0–0.1)
Eosinophils Relative: 2 % (ref 0–5)
Lymphocytes Relative: 50 % — ABNORMAL HIGH (ref 12–46)
Neutro Abs: 1.9 10*3/uL (ref 1.7–7.7)
Neutrophils Relative %: 38 % — ABNORMAL LOW (ref 43–77)

## 2012-01-29 LAB — CBC
Platelets: 307 10*3/uL (ref 150–400)
RDW: 13 % (ref 11.5–15.5)
WBC: 5 10*3/uL (ref 4.0–10.5)

## 2012-01-29 LAB — POCT I-STAT TROPONIN I: Troponin i, poc: 0 ng/mL (ref 0.00–0.08)

## 2012-01-29 MED ORDER — IBUPROFEN 800 MG PO TABS
800.0000 mg | ORAL_TABLET | Freq: Once | ORAL | Status: AC
  Administered 2012-01-29: 800 mg via ORAL

## 2012-01-29 MED ORDER — OMEPRAZOLE 10 MG PO CPDR: 10.0000 mg | DELAYED_RELEASE_CAPSULE | Freq: Every day | ORAL | Status: DC

## 2012-01-29 MED ORDER — MORPHINE SULFATE 4 MG/ML IJ SOLN
4.0000 mg | Freq: Once | INTRAMUSCULAR | Status: AC
  Administered 2012-01-29: 4 mg via INTRAVENOUS
  Filled 2012-01-29: qty 1

## 2012-01-29 MED ORDER — ONDANSETRON HCL 4 MG/2ML IJ SOLN: 4.0000 mg | INTRAMUSCULAR | Status: DC | PRN

## 2012-01-29 MED ORDER — NITROGLYCERIN 0.4 MG SL SUBL
0.4000 mg | SUBLINGUAL_TABLET | SUBLINGUAL | Status: DC | PRN
  Administered 2012-01-29: 0.4 mg via SUBLINGUAL
  Filled 2012-01-29: qty 25

## 2012-01-29 MED ORDER — GI COCKTAIL ~~LOC~~
30.0000 mL | Freq: Once | ORAL | Status: AC
  Administered 2012-01-29: 30 mL via ORAL
  Filled 2012-01-29: qty 30

## 2012-01-29 MED ORDER — SODIUM CHLORIDE 0.9 % IV BOLUS (SEPSIS)
500.0000 mL | INTRAVENOUS | Status: AC
  Administered 2012-01-29: 500 mL via INTRAVENOUS

## 2012-01-29 MED ORDER — IBUPROFEN 800 MG PO TABS
ORAL_TABLET | ORAL | Status: AC
  Filled 2012-01-29: qty 1

## 2012-01-29 MED ORDER — ASPIRIN 81 MG PO CHEW
324.0000 mg | CHEWABLE_TABLET | Freq: Once | ORAL | Status: AC
  Administered 2012-01-29: 324 mg via ORAL
  Filled 2012-01-29: qty 4

## 2012-01-29 NOTE — ED Provider Notes (Signed)
Medical screening examination/treatment/procedure(s) were performed by non-physician practitioner and as supervising physician I was immediately available for consultation/collaboration.  Mella Inclan R. Darlynn Ricco, MD 01/29/12 2358 

## 2012-01-29 NOTE — ED Provider Notes (Signed)
History     CSN: [id number]  Arrival date & time 01/29/12  1530   First MD Initiated Contact with Patient 01/29/12 1606      No chief complaint on file.   (Consider location/radiation/quality/duration/timing/severity/associated sxs/prior treatment) HPI Comments: Patient with a history of diabetes, hypertension, GERD and possibly CAD presents emergency department chief complaint chest pain.  Onset of symptoms began 2 days ago, location left side of sternum and epigastric, no radiation, duration is intermittent lasting up to an hour, gradually worsening, severity now is 6/10 and 10/10 at times, described as sharp and burning in nature, associated with lightheadedness, dizziness, & eating.  Not associated with exertion, position, shortness of breath, DOE, orthopnea, PND, leg swelling, cough, hemoptysis, fevers, night sweats or chills.  Pain is relieved by drinking baking soda and water. Pt is currently taking protonix.   Cardiologist: Dr. Anne Fu at [facility], cath 2009 showed some mild CAD, no obstruction IMPRESSION:  1. Mild coronary artery disease, most notably in the right coronary      artery with up to 50% proximal stenosis and diffuse distal      irregularities up to 30%.  2. Normal left ventricular ejection fraction with no wall motion      abnormality.   The history is provided by the patient.    Past Medical History  Diagnosis Date  . Diabetes mellitus   . Hypertension     Past Surgical History  Procedure Date  . Tonsillectomy     No family history on file.  History  Substance Use Topics  . Smoking status: Never Smoker   . Smokeless tobacco: Not on file  . Alcohol Use: No    OB History    Grav Para Term Preterm Abortions TAB SAB Ect Mult Living                  Review of Systems  Constitutional: Negative for fever, chills and appetite change.  HENT: Negative for congestion.   Eyes: Negative for visual disturbance.  Respiratory: Negative for shortness of  breath.   Cardiovascular: Positive for chest pain. Negative for leg swelling.  Gastrointestinal: Negative for nausea, vomiting and abdominal pain.  Genitourinary: Negative for dysuria, urgency and frequency.  Neurological: Negative for dizziness, syncope, weakness, light-headedness, numbness and headaches.  Psychiatric/Behavioral: Negative for confusion.  All other systems reviewed and are negative.    Allergies  Review of patient's allergies indicates no known allergies.  Home Medications   Current Outpatient Rx  Name Route Sig Dispense Refill  . AMLODIPINE BESYLATE 5 MG PO TABS Oral Take 5 mg by mouth daily.    . ASPIRIN EC 81 MG PO TBEC Oral Take 81 mg by mouth daily.    . BUPROPION HCL ER (XL) 150 MG PO TB24 Oral Take 150 mg by mouth 3 (three) times daily.    Marland Kitchen HYDROCODONE-ACETAMINOPHEN 5-325 MG PO TABS Oral Take 1 tablet by mouth every 4 (four) hours as needed. For pain.    Marland Kitchen PANTOPRAZOLE SODIUM 40 MG PO TBEC Oral Take 40 mg by mouth daily.    Marland Kitchen PERPHENAZINE 4 MG PO TABS Oral Take 2-6 mg by mouth 2 (two) times daily. 0.5 tab in am, 1.5 tab in pm    . PRAVASTATIN SODIUM 40 MG PO TABS Oral Take 40 mg by mouth at bedtime.    . SERTRALINE HCL 100 MG PO TABS Oral Take 200 mg by mouth daily.      BP 123/76  Pulse 88  Temp(Src) 98.5 F (36.9 C) (Oral)  Resp 18  SpO2 100%  Physical Exam  Nursing note and vitals reviewed. Constitutional: She appears well-developed and well-nourished. No distress.  HENT:  Head: Normocephalic and atraumatic.  Eyes: Conjunctivae and EOM are normal. Pupils are equal, round, and reactive to light.  Neck: Normal range of motion. Neck supple. Normal carotid pulses and no JVD present. Carotid bruit is not present. No rigidity. Normal range of motion present.  Cardiovascular: Normal rate, regular rhythm, S1 normal, S2 normal, normal heart sounds, intact distal pulses and normal pulses.  Exam reveals no gallop and no friction rub.   No murmur heard.       No pitting edema bilaterally, RRR, no aberrant sounds on auscultations, distal pulses intact, no carotid bruit or JVD.   Pulmonary/Chest: Effort normal and breath sounds normal. No accessory muscle usage or stridor. No respiratory distress. She exhibits no tenderness and no bony tenderness.  Abdominal: Bowel sounds are normal.       Obese, Soft non tender. Non pulsatile aorta.   Skin: Skin is warm, dry and intact. No rash noted. She is not diaphoretic. No cyanosis. Nails show no clubbing.    ED Course  Procedures (including critical care time)  Labs Reviewed  DIFFERENTIAL - Abnormal; Notable for the following:    Neutrophils Relative 38 (*)    Lymphocytes Relative 50 (*)    All other components within normal limits  BASIC METABOLIC PANEL - Abnormal; Notable for the following:    Glucose, Bld 102 (*)    Creatinine, Ser 1.16 (*)    GFR calc non Af Amer 54 (*)    GFR calc Af Amer 63 (*)    All other components within normal limits  CBC  POCT I-STAT TROPONIN I   Dg Chest Port 1 View  01/29/2012  *RADIOLOGY REPORT*  Clinical Data: Chest pain and weakness  PORTABLE CHEST - 1 VIEW  Comparison: 03/03/2010  Findings: Normal heart size.  Linear atelectasis at the left base. Otherwise clear lungs.  No pneumothorax or pleural effusion.  IMPRESSION: No active cardiopulmonary disease.  Original Report Authenticated By: Donavan Burnet, M.D.     No diagnosis found.   Date: 01/29/2012  Rate: 82  Rhythm: normal sinus rhythm  QRS Axis: normal  Intervals: normal  ST/T Wave abnormalities: non specific t wave changes  Conduction Disutrbances: none  Narrative Interpretation:   Old EKG Reviewed:Same as old ECG      MDM  CP Patient is to be discharged with recommendation to follow up with PCP in regards to today's hospital visit. Chest pain is not likely of cardiac or pulmonary etiology d/t presentation, perc negative, VSS, no tracheal deviation, no JVD or new murmur, RRR, breath sounds equal  bilaterally, EKG without acute abnormalities, negative troponin, and negative CXR. Pt has been advised start a PPI and return to the ED is CP becomes exertional, associated with diaphoresis or nausea, radiates to left jaw/arm, worsens or becomes concerning in any way. Pt appears reliable for follow up and is agreeable to discharge.   Case has been discussed with and seen by Dr. Rubin Payor who agrees with the above plan to discharge.   Consult: [facility] to follow up with pt this week, Spoke with on call doctor who agrees with plan         Jaci Carrel, PA-C 01/29/12 1955

## 2012-01-29 NOTE — Discharge Instructions (Signed)
Read instructions below for reasons to return to the Emergency Department. It is recommended that your follow up with your Primary Care Doctor in regards to today's visit. If you do not have a doctor, use the resource guide listed below to help you find one. Begin taking over the counter Prilosec or Zegrid as directed.   Chest Pain (Nonspecific)  HOME CARE INSTRUCTIONS  For the next few days, avoid physical activities that bring on chest pain. Continue physical activities as directed.  Do not smoke cigarettes or drink alcohol until your symptoms are gone.  Only take over-the-counter or prescription medicine for pain, discomfort, or fever as directed by your caregiver.  Follow your caregiver's suggestions for further testing if your chest pain does not go away.  Keep any follow-up appointments you made. If you do not go to an appointment, you could develop lasting (chronic) problems with pain. If there is any problem keeping an appointment, you must call to reschedule.  SEEK MEDICAL CARE IF:  You think you are having problems from the medicine you are taking. Read your medicine instructions carefully.  Your chest pain does not go away, even after treatment.  You develop a rash with blisters on your chest.  SEEK IMMEDIATE MEDICAL CARE IF:  You have increased chest pain or pain that spreads to your arm, neck, jaw, back, or belly (abdomen).  You develop shortness of breath, an increasing cough, or you are coughing up blood.  You have severe back or abdominal pain, feel sick to your stomach (nauseous) or throw up (vomit).  You develop severe weakness, fainting, or chills.  You have an oral temperature above 102 F (38.9 C), not controlled by medicine.   THIS IS AN EMERGENCY. Do not wait to see if the pain will go away. Get medical help at once. Call your local emergency services (911 in [place] Do not drive yourself to the hospital.   RESOURCE GUIDE  Dental Problems  Patients with  Medicaid: [facility]                     [facility] [address]                                           [address] Phone:  [phone]                                                  Phone:  [phone]  If unable to pay or uninsured, contact:  [facility] or [facility] to become qualified for the adult dental clinic.  Chronic Pain Problems Contact [facility]  [phone] Patients need to be referred by their primary care doctor.  Insufficient Money for Medicine Contact [employer]:  call "211" or [facility] 271-5999.  No Primary Care Doctor Call [facility]  832-8000 Other agencies that provide inexpensive medical care    [facility]  [phone]    [facility]  [phone]    [facility]  [phone]    [facility] Clinic  [phone]    [facility]  [phone]    [facility]  [phone]  Psychological Services   [facility]  [phone] [facility]  [phone] [facility]   [phone] (emergency services 641-4993)  Substance Abuse Resources [facility]  336-882-2125 [facility] 336-784-9470 [facility] 336-285-9073 [facility] 336-845-3988 Residential & Outpatient Substance Abuse Program  800-659-3381  Abuse/Neglect [place] Child Abuse Hotline (336) 641-3795 [place] Child Abuse Hotline 800-378-5315 (After Hours)  Emergency Shelter [facility] (336) 271-5985  [facility] (336) 275-9566 [facility] (704) 372-4663  MRSA Hotline #:   832-7006    [place] Resources  [facility] [place]     [employer]                          [facility] [address] [place]                       [address]      [address]  [place]                                                 [place]                            [place] Phone:  [phone]                                   Phone:  [phone]                 Phone:  [phone]  [facility] Phone:  [phone]  [place] Child Abuse Hotline (336) 342-1394 (336) 342-3537 (After Hours)   

## 2012-01-29 NOTE — ED Notes (Signed)
Pt denies SOB or nausea.

## 2012-01-29 NOTE — ED Notes (Signed)
Pt reports chest pain that started today. Pt describes pain that burns and she feels in the center. Pt denies any other pain.  Pt had heart cath 2009 that pt reports dr said that it did so some moderated blockage. No stints placed.

## 2012-02-06 ENCOUNTER — Other Ambulatory Visit: Payer: Self-pay | Admitting: Otolaryngology

## 2012-02-17 ENCOUNTER — Encounter (HOSPITAL_BASED_OUTPATIENT_CLINIC_OR_DEPARTMENT_OTHER): Payer: Self-pay | Admitting: *Deleted

## 2012-02-18 ENCOUNTER — Encounter (HOSPITAL_BASED_OUTPATIENT_CLINIC_OR_DEPARTMENT_OTHER): Payer: Self-pay | Admitting: *Deleted

## 2012-02-18 NOTE — Progress Notes (Signed)
NPO AFTER MN WITH EXCEPTIO WATER/ GATORADE UNTIL 0700. ARRIVES AT 1100. NEEDS ISTAT. CURRENT EKG 01-29-2012 W/ CHART. WILL TAKE PRILOSEC AND NORVASC AM OF SURG W/ SIP OF WATER, AND NORCO IF NEEDED.

## 2012-02-20 ENCOUNTER — Encounter (HOSPITAL_BASED_OUTPATIENT_CLINIC_OR_DEPARTMENT_OTHER): Payer: Self-pay | Admitting: Anesthesiology

## 2012-02-20 ENCOUNTER — Encounter (HOSPITAL_BASED_OUTPATIENT_CLINIC_OR_DEPARTMENT_OTHER): Payer: Self-pay | Admitting: *Deleted

## 2012-02-20 ENCOUNTER — Ambulatory Visit (HOSPITAL_BASED_OUTPATIENT_CLINIC_OR_DEPARTMENT_OTHER): Payer: Medicare Other | Admitting: Anesthesiology

## 2012-02-20 ENCOUNTER — Encounter (HOSPITAL_BASED_OUTPATIENT_CLINIC_OR_DEPARTMENT_OTHER)
Admission: RE | Disposition: A | Discharge status: Home / Self Care | Payer: Self-pay | Source: Ambulatory Visit | Attending: Specialist

## 2012-02-20 ENCOUNTER — Ambulatory Visit (HOSPITAL_BASED_OUTPATIENT_CLINIC_OR_DEPARTMENT_OTHER)
Admission: RE | Admit: 2012-02-20 | Discharge: 2012-02-20 | Disposition: A | Discharge status: Home / Self Care | Payer: Medicare Other | Source: Ambulatory Visit | Attending: Specialist | Admitting: Specialist

## 2012-02-20 DIAGNOSIS — M25819 Other specified joint disorders, unspecified shoulder: Secondary | ICD-10-CM | POA: Insufficient documentation

## 2012-02-20 DIAGNOSIS — I10 Essential (primary) hypertension: Secondary | ICD-10-CM | POA: Insufficient documentation

## 2012-02-20 DIAGNOSIS — Z79899 Other long term (current) drug therapy: Secondary | ICD-10-CM | POA: Insufficient documentation

## 2012-02-20 DIAGNOSIS — K219 Gastro-esophageal reflux disease without esophagitis: Secondary | ICD-10-CM | POA: Insufficient documentation

## 2012-02-20 DIAGNOSIS — M7540 Impingement syndrome of unspecified shoulder: Secondary | ICD-10-CM

## 2012-02-20 DIAGNOSIS — I2510 Atherosclerotic heart disease of native coronary artery without angina pectoris: Secondary | ICD-10-CM | POA: Insufficient documentation

## 2012-02-20 DIAGNOSIS — M24119 Other articular cartilage disorders, unspecified shoulder: Secondary | ICD-10-CM | POA: Insufficient documentation

## 2012-02-20 HISTORY — DX: Impingement syndrome of left shoulder: M75.42

## 2012-02-20 HISTORY — DX: Gastro-esophageal reflux disease without esophagitis: K21.9

## 2012-02-20 HISTORY — DX: Major depressive disorder, single episode, severe with psychotic features: F32.3

## 2012-02-20 HISTORY — DX: Atherosclerotic heart disease of native coronary artery without angina pectoris: I25.10

## 2012-02-20 HISTORY — DX: Personal history of other diseases of the digestive system: Z87.19

## 2012-02-20 LAB — POCT I-STAT 4, (NA,K, GLUC, HGB,HCT)
Glucose, Bld: 102 mg/dL — ABNORMAL HIGH (ref 70–99)
HCT: 44 % (ref 36.0–46.0)
Sodium: 143 mEq/L (ref 135–145)

## 2012-02-20 SURGERY — SHOULDER ARTHROSCOPY WITH SUBACROMIAL DECOMPRESSION
Anesthesia: General | Site: Shoulder | Laterality: Left | Wound class: Clean

## 2012-02-20 MED ORDER — FENTANYL CITRATE 0.05 MG/ML IJ SOLN
INTRAMUSCULAR | Status: DC | PRN
  Administered 2012-02-20 (×2): 25 ug via INTRAVENOUS
  Administered 2012-02-20: 100 ug via INTRAVENOUS
  Administered 2012-02-20: 50 ug via INTRAVENOUS

## 2012-02-20 MED ORDER — CEFAZOLIN SODIUM 1-5 GM-% IV SOLN
INTRAVENOUS | Status: DC | PRN
  Administered 2012-02-20: 2 g via INTRAVENOUS

## 2012-02-20 MED ORDER — MIDAZOLAM HCL 5 MG/5ML IJ SOLN
INTRAMUSCULAR | Status: DC | PRN
  Administered 2012-02-20: 2 mg via INTRAVENOUS

## 2012-02-20 MED ORDER — LACTATED RINGERS IV SOLN
INTRAVENOUS | Status: DC | PRN
  Administered 2012-02-20 (×2): via INTRAVENOUS

## 2012-02-20 MED ORDER — LACTATED RINGERS IV SOLN
INTRAVENOUS | Status: DC
  Administered 2012-02-20: 12:00:00 via INTRAVENOUS

## 2012-02-20 MED ORDER — SODIUM CHLORIDE 0.9 % IR SOLN
Status: DC | PRN
  Administered 2012-02-20: 14:00:00

## 2012-02-20 MED ORDER — PROPOFOL 10 MG/ML IV EMUL
INTRAVENOUS | Status: DC | PRN
  Administered 2012-02-20: 200 mg via INTRAVENOUS

## 2012-02-20 MED ORDER — CHLORHEXIDINE GLUCONATE 4 % EX LIQD: 60.0000 mL | Freq: Once | CUTANEOUS | Status: DC

## 2012-02-20 MED ORDER — PROMETHAZINE HCL 25 MG/ML IJ SOLN: 6.2500 mg | INTRAMUSCULAR | Status: DC | PRN

## 2012-02-20 MED ORDER — METHOCARBAMOL 500 MG PO TABS: 500.0000 mg | ORAL_TABLET | Freq: Four times a day (QID) | ORAL | Status: AC

## 2012-02-20 MED ORDER — CEFAZOLIN SODIUM 1-5 GM-% IV SOLN: 1.0000 g | INTRAVENOUS | Status: DC

## 2012-02-20 MED ORDER — LACTATED RINGERS IV SOLN: INTRAVENOUS | Status: DC

## 2012-02-20 MED ORDER — DEXAMETHASONE SODIUM PHOSPHATE 4 MG/ML IJ SOLN
INTRAMUSCULAR | Status: DC | PRN
  Administered 2012-02-20: 4 mg via INTRAVENOUS

## 2012-02-20 MED ORDER — FENTANYL CITRATE 0.05 MG/ML IJ SOLN
25.0000 ug | INTRAMUSCULAR | Status: DC | PRN
  Administered 2012-02-20 (×2): 25 ug via INTRAVENOUS

## 2012-02-20 MED ORDER — HYDROCODONE-ACETAMINOPHEN 7.5-325 MG PO TABS: 2.0000 | ORAL_TABLET | ORAL | Status: AC | PRN

## 2012-02-20 MED ORDER — KETOROLAC TROMETHAMINE 30 MG/ML IJ SOLN
INTRAMUSCULAR | Status: DC | PRN
  Administered 2012-02-20: 30 mg via INTRAVENOUS

## 2012-02-20 MED ORDER — ONDANSETRON HCL 4 MG/2ML IJ SOLN
INTRAMUSCULAR | Status: DC | PRN
  Administered 2012-02-20: 4 mg via INTRAVENOUS

## 2012-02-20 MED ORDER — LIDOCAINE HCL (CARDIAC) 20 MG/ML IV SOLN
INTRAVENOUS | Status: DC | PRN
  Administered 2012-02-20: 50 mg via INTRAVENOUS

## 2012-02-20 MED ORDER — BUPIVACAINE-EPINEPHRINE 0.25% -1:200000 IJ SOLN
INTRAMUSCULAR | Status: DC | PRN
  Administered 2012-02-20: 10 mL

## 2012-02-20 MED ORDER — SUCCINYLCHOLINE CHLORIDE 20 MG/ML IJ SOLN
INTRAMUSCULAR | Status: DC | PRN
  Administered 2012-02-20: 120 mg via INTRAVENOUS

## 2012-02-20 SURGICAL SUPPLY — 79 items
APL SKNCLS STERI-STRIP NONHPOA (GAUZE/BANDAGES/DRESSINGS)
BENZOIN TINCTURE PRP APPL 2/3 (GAUZE/BANDAGES/DRESSINGS) IMPLANT
BLADE 4.2CUDA (BLADE) ×2 IMPLANT
BLADE CUDA SHAVER 3.5 (BLADE) ×2 IMPLANT
BLADE CUTTER GATOR 3.5 (BLADE) IMPLANT
BLADE FLAT COURSE (BLADE) IMPLANT
BLADE GREAT WHITE 4.2 (BLADE) IMPLANT
BLADE SURG 11 STRL SS (BLADE) ×2 IMPLANT
BNDG COHESIVE 4X5 TAN NS LF (GAUZE/BANDAGES/DRESSINGS) ×2 IMPLANT
BUR OVAL 4.0 (BURR) IMPLANT
BUR VERTEX HOODED 4.5 (BURR) IMPLANT
CANISTER SUCT LVC 12 LTR MEDI- (MISCELLANEOUS) ×2 IMPLANT
CANISTER SUCTION 2500CC (MISCELLANEOUS) IMPLANT
CANNULA ACUFLEX KIT 5X76 (CANNULA) ×2 IMPLANT
CANNULA SHOULDER 7CM (CANNULA) IMPLANT
CANNULA TWIST IN 8.25X7CM (CANNULA) IMPLANT
CLEANER CAUTERY TIP 5X5 PAD (MISCELLANEOUS) IMPLANT
CLOTH BEACON ORANGE TIMEOUT ST (SAFETY) ×2 IMPLANT
DRAPE LG THREE QUARTER DISP (DRAPES) IMPLANT
DRAPE STERI 35X30 U-POUCH (DRAPES) ×2 IMPLANT
DRAPE U 20/CS (DRAPES) ×4 IMPLANT
DRSG EMULSION OIL 3X3 NADH (GAUZE/BANDAGES/DRESSINGS) ×1 IMPLANT
DRSG PAD ABDOMINAL 8X10 ST (GAUZE/BANDAGES/DRESSINGS) ×1 IMPLANT
DURAPREP 26ML APPLICATOR (WOUND CARE) ×2 IMPLANT
ELECT MENISCUS 165MM 90D (ELECTRODE) IMPLANT
ELECT NDL TIP 2.8 STRL (NEEDLE) ×1 IMPLANT
ELECT NEEDLE TIP 2.8 STRL (NEEDLE) ×2 IMPLANT
ELECT REM PT RETURN 9FT ADLT (ELECTROSURGICAL) ×2
ELECTRODE REM PT RTRN 9FT ADLT (ELECTROSURGICAL) ×1 IMPLANT
GLOVE BIO SURGEON STRL SZ 6 (GLOVE) ×1 IMPLANT
GLOVE BIO SURGEON STRL SZ 6.5 (GLOVE) ×2 IMPLANT
GLOVE ECLIPSE 6.5 STRL STRAW (GLOVE) ×1 IMPLANT
GLOVE INDICATOR 7.0 STRL GRN (GLOVE) ×2 IMPLANT
GLOVE SURG SS PI 8.0 STRL IVOR (GLOVE) ×2 IMPLANT
GOWN STRL NON-REIN LRG LVL3 (GOWN DISPOSABLE) ×2 IMPLANT
GOWN SURGICAL LARGE (GOWNS) ×2 IMPLANT
IV NS IRRIG 3000ML ARTHROMATIC (IV SOLUTION) ×2 IMPLANT
NDL 1/2 CIR CATGUT .05X1.09 (NEEDLE) IMPLANT
NDL FILTER BLUNT 18X1 1/2 (NEEDLE) ×1 IMPLANT
NDL SAFETY ECLIPSE 18X1.5 (NEEDLE) ×1 IMPLANT
NDL SPNL 18GX3.5 QUINCKE PK (NEEDLE) ×1 IMPLANT
NDL SUT 6 .5 CRC .975X.05 MAYO (NEEDLE) IMPLANT
NEEDLE 1/2 CIR CATGUT .05X1.09 (NEEDLE) IMPLANT
NEEDLE FILTER BLUNT 18X 1/2SAF (NEEDLE) ×1
NEEDLE FILTER BLUNT 18X1 1/2 (NEEDLE) ×1 IMPLANT
NEEDLE HYPO 18GX1.5 SHARP (NEEDLE) ×2
NEEDLE MAYO TAPER (NEEDLE)
NEEDLE SPNL 18GX3.5 QUINCKE PK (NEEDLE) ×2 IMPLANT
NS IRRIG 500ML POUR BTL (IV SOLUTION) IMPLANT
PACK BASIN DAY SURGERY FS (CUSTOM PROCEDURE TRAY) ×2 IMPLANT
PACK SHOULDER CUSTOM OPM052 (CUSTOM PROCEDURE TRAY) ×2 IMPLANT
PAD CLEANER CAUTERY TIP 5X5 (MISCELLANEOUS)
SET ARTHROSCOPY TUBING (MISCELLANEOUS) ×2
SET ARTHROSCOPY TUBING LN (MISCELLANEOUS) ×1 IMPLANT
SHEET MEDIUM DRAPE 40X70 STRL (DRAPES) ×1 IMPLANT
SLING ARM FOAM STRAP LRG (SOFTGOODS) IMPLANT
SLING ULTRA II LARGE (SOFTGOODS) ×1 IMPLANT
SPONGE GAUZE 4X4 12PLY (GAUZE/BANDAGES/DRESSINGS) ×1 IMPLANT
STAPLER VISISTAT 35W (STAPLE) IMPLANT
STRIP CLOSURE SKIN 1/2X4 (GAUZE/BANDAGES/DRESSINGS) IMPLANT
SUT BONE WAX W31G (SUTURE) IMPLANT
SUT ETHIBOND 1 OS 2 18 CR3 (SUTURE) IMPLANT
SUT ETHIBOND 2 OS 4 DA (SUTURE) IMPLANT
SUT ETHILON 4 0 PS 2 18 (SUTURE) ×2 IMPLANT
SUT FIBERWIRE #2 38 REV NDL BL (SUTURE)
SUT MON AB 4-0 PC3 18 (SUTURE) IMPLANT
SUT PDS AB 0 CT 36 (SUTURE) IMPLANT
SUT PROLENE 3 0 PS 2 (SUTURE) IMPLANT
SUT VIC AB 1 CT1 36 (SUTURE) IMPLANT
SUT VIC AB 1-0 CT2 27 (SUTURE) IMPLANT
SUT VIC AB 2-0 CT2 27 (SUTURE) ×2 IMPLANT
SUT VICRYL 0 UR6 27IN ABS (SUTURE) ×4 IMPLANT
SUT VICRYL 0-0 OS 2 NEEDLE (SUTURE) IMPLANT
SUTURE FIBERWR#2 38 REV NDL BL (SUTURE) IMPLANT
SYRINGE 10CC LL (SYRINGE) ×2 IMPLANT
TAPE HYPAFIX 6X30 (GAUZE/BANDAGES/DRESSINGS) ×2 IMPLANT
TOWEL OR 17X24 6PK STRL BLUE (TOWEL DISPOSABLE) ×4 IMPLANT
WAND 90 DEG TURBOVAC W/CORD (SURGICAL WAND) IMPLANT
WATER STERILE IRR 500ML POUR (IV SOLUTION) ×2 IMPLANT

## 2012-02-20 NOTE — Anesthesia Preprocedure Evaluation (Addendum)
Anesthesia Evaluation  Patient identified by MRN, date of birth, ID band Patient awake    Reviewed: Allergy & Precautions, H&P , NPO status , Patient's Chart, lab work & pertinent test results  History of Anesthesia Complications Negative for: history of anesthetic complications  Airway Mallampati: II TM Distance: >3 FB Neck ROM: Full    Dental  (+) Edentulous Upper, Dental Advisory Given and Upper Dentures   Pulmonary neg pulmonary ROS,  breath sounds clear to auscultation  Pulmonary exam normal       Cardiovascular hypertension, Pt. on medications + CAD (Mild nonobstructive CAD) Rhythm:Regular Rate:Normal     Neuro/Psych PSYCHIATRIC DISORDERS Depression negative neurological ROS     GI/Hepatic Neg liver ROS, hiatal hernia, GERD-  Medicated,  Endo/Other  negative endocrine ROSMorbid obesity  Renal/GU negative Renal ROS  negative genitourinary   Musculoskeletal negative musculoskeletal ROS (+)   Abdominal (+) + obese,   Peds  Hematology negative hematology ROS (+)   Anesthesia Other Findings   Reproductive/Obstetrics negative OB ROS                          Anesthesia Physical Anesthesia Plan  ASA: II  Anesthesia Plan: General   Post-op Pain Management:    Induction: Intravenous  Airway Management Planned: Oral ETT  Additional Equipment:   Intra-op Plan:   Post-operative Plan: Extubation in OR  Informed Consent: I have reviewed the patients History and Physical, chart, labs and discussed the procedure including the risks, benefits and alternatives for the proposed anesthesia with the patient or authorized representative who has indicated his/her understanding and acceptance.   Dental advisory given  Plan Discussed with: CRNA  Anesthesia Plan Comments:         Anesthesia Quick Evaluation

## 2012-02-20 NOTE — Discharge Instructions (Signed)

## 2012-02-20 NOTE — H&P (Signed)
[name] is an 51 y.o. female.   Chief Complaint: left shoulder pain HPI: Impingement left shoulder  Past Medical History  Diagnosis Date  . Hypertension   . GERD (gastroesophageal reflux disease)   . H/O hiatal hernia   . Severe major depression with psychotic features   . Impingement syndrome of left shoulder   . Coronary artery disease CARDIOLOGIST-- DR Anne Fu--  WILL REQUEST NOTE    MILD NON-OBSTRUCTIVE CAD    Past Surgical History  Procedure Date  . Cardiac catheterization 01-20-2008   DR SKAINS    MILD CAD/ RCA WITH UP TO 50% PROXIMAL /  DIFFUSE DISTAL IRREGULARITIES UP TO 30%/ NORMAL LVEF  . Tonsillectomy and adenoidectomy AS CHILD    AND PALETTE REPAIR  . Total abdominal hysterectomy w/ bilateral salpingoophorectomy 2001  . Bilateral carpal tunnel release 2006  &  2009    History reviewed. No pertinent family history. Social History:  reports that she has never smoked. She has never used smokeless tobacco. She reports that she does not drink alcohol or use illicit drugs.  Allergies:  Allergies  Allergen Reactions  . Adhesive (Tape) Other (See Comments)    Irritation and tears skin    Medications Prior to Admission  Medication Sig Dispense Refill  . amLODipine (NORVASC) 5 MG tablet Take 5 mg by mouth every morning.       Marland Kitchen aspirin EC 81 MG tablet Take 81 mg by mouth every evening.       Marland Kitchen buPROPion (WELLBUTRIN XL) 150 MG 24 hr tablet Take 150 mg by mouth 2 (two) times daily. Takes x2 tabs in am and x1 tab in pm      . HYDROcodone-acetaminophen (NORCO) 5-325 MG per tablet Take 1 tablet by mouth every 4 (four) hours as needed. For pain.      Marland Kitchen omeprazole (PRILOSEC) 10 MG capsule Take 10 mg by mouth every morning.      Marland Kitchen perphenazine (TRILAFON) 4 MG tablet Take 4 mg by mouth every evening. 0.5 tab in am, 1.5 tab in pm      . pravastatin (PRAVACHOL) 40 MG tablet Take 40 mg by mouth at bedtime.      . sertraline (ZOLOFT) 100 MG tablet Take 200 mg by mouth daily.         Results for orders placed during the hospital encounter of 02/20/12 (from the past 48 hour(s))  POCT I-STAT 4, (NA,K, GLUC, HGB,HCT)     Status: Abnormal   Collection Time   02/20/12 11:36 AM      Component Value Range Comment   Sodium 143  135 - 145 (mEq/L)    Potassium 4.2  3.5 - 5.1 (mEq/L)    Glucose, Bld 102 (*) 70 - 99 (mg/dL)    HCT 29.5  28.4 - 13.2 (%)    Hemoglobin 15.0  12.0 - 15.0 (g/dL)    No results found.  Review of Systems  Constitutional: Negative.   Musculoskeletal: Positive for joint pain.  All other systems reviewed and are negative.    Blood pressure 135/94, pulse 89, temperature 97.6 F (36.4 C), temperature source Oral, resp. rate 18, height 5\' 8"  (1.727 m), weight 113.399 kg (250 lb), SpO2 97.00%. Physical Exam  Constitutional: She appears well-developed.  HENT:  Head: Normocephalic.  Eyes: Pupils are equal, round, and reactive to light.  Neck: Normal range of motion.  Cardiovascular: Normal rate.   Respiratory: Effort normal.  GI: Soft.  Musculoskeletal: She exhibits tenderness.  Positive impingement left  Neurological: She is alert.  Skin: Skin is warm.  Psychiatric: She has a normal mood and affect.     Assessment/Plan Impingement left shoulder. Plan LSA SAD possible RCR. Risks discussed.  Geordie Nooney C 02/20/2012, 1:15 PM

## 2012-02-20 NOTE — Progress Notes (Signed)
Received report as caregiver. 

## 2012-02-20 NOTE — Brief Op Note (Signed)
02/20/2012  2:17 PM  PATIENT:  [name]  51 y.o. female  PRE-OPERATIVE DIAGNOSIS:  IMPINGEMENT AND ROTATOR CUFF LEFT SHOULDER  POST-OPERATIVE DIAGNOSIS:  IMPINGEMENT AND ROTATOR CUFF LEFT SHOULDER  PROCEDURE:  Procedure(s) (LRB): SHOULDER ARTHROSCOPY WITH SUBACROMIAL DECOMPRESSION (Left)  SURGEON:  Surgeon(s) and Role:    * Javier Docker, MD - Primary  PHYSICIAN ASSISTANT:   ASSISTANTS: strader   ANESTHESIA:   general  EBL:  Total I/O In: 1000 [I.V.:1000] Out: -   BLOOD ADMINISTERED:none  DRAINS: none   LOCAL MEDICATIONS USED:  MARCAINE     SPECIMEN:  No Specimen  DISPOSITION OF SPECIMEN:  N/A  COUNTS:  YES  TOURNIQUET:  * No tourniquets in log *  DICTATION: .Other Dictation: Dictation Number [id number]  PLAN OF CARE: Discharge to home after PACU  PATIENT DISPOSITION:  PACU - hemodynamically stable.   Delay start of Pharmacological VTE agent (>24hrs) due to surgical blood loss or risk of bleeding: no

## 2012-02-20 NOTE — Anesthesia Postprocedure Evaluation (Signed)
  Anesthesia Post-op Note  Patient: [name]  Procedure(s) Performed: Procedure(s) (LRB): SHOULDER ARTHROSCOPY WITH SUBACROMIAL DECOMPRESSION (Left)  Patient Location: PACU  Anesthesia Type: General  Level of Consciousness: awake and alert   Airway and Oxygen Therapy: Patient Spontanous Breathing  Post-op Pain: mild  Post-op Assessment: Post-op Vital signs reviewed, Patient's Cardiovascular Status Stable, Respiratory Function Stable, Patent Airway and No signs of Nausea or vomiting  Post-op Vital Signs: stable  Complications: No apparent anesthesia complications

## 2012-02-20 NOTE — Transfer of Care (Signed)
Immediate Anesthesia Transfer of Care Note Immediate Anesthesia Transfer of Care Note  Patient: [name]  Procedure(s) Performed: Procedure(s) (LRB): SHOULDER ARTHROSCOPY WITH SUBACROMIAL DECOMPRESSION (Left)  Patient Location: PACU  Anesthesia Type: General  Level of Consciousness: awake, sedated, patient cooperative and responds to stimulation  Airway & Oxygen Therapy: Patient Spontanous Breathing and Patient connected to face mask oxygen  Post-op Assessment: Report given to PACU RN, Post -op Vital signs reviewed and stable and Patient moving all extremities  Post vital signs: Reviewed and stable  Complications: No apparent anesthesia complications

## 2012-02-22 NOTE — Op Note (Signed)
NAMELATRESA, GASSER                 ACCOUNT NO.:  [id number]  MEDICAL RECORD NO.:  [id number]  LOCATION:                               FACILITY:  [facility]  PHYSICIAN:  Jene Every, M.D.    DATE OF BIRTH:  1961-02-05  DATE OF PROCEDURE:  02/20/2012 DATE OF DISCHARGE:                              OPERATIVE REPORT   PREOPERATIVE DIAGNOSIS:  Impingement syndrome, left shoulder.  POSTOP DIAGNOSES:  Impingement syndrome, left shoulder; anterior and posterior labral tear.  PROCEDURES PERFORMED: 1. Exam under anesthesia. 2. Left shoulder arthroscopy, debridement of anterior posterior     labrum. 3. Subacromial decompression with bursectomy.  ANESTHESIA:  General.  ASSISTANT:  Roma Schanz, P.A.  HISTORY:  This is a 51 year old female with persistent neck and upper extremity pain.  She had residual impingement despite rest, activity modification, corticosteroid injection.  The patient had a formal supervised physical therapy, without resolve.  She had some neck pain associated with this as well.  She had been refractory to conservative treatment and therefore was indicated for evaluation.  On examination under anesthesia, she had a partial tear of the rotator cuff with persistent pain.  She was indicated for evaluation under anesthesia, subacromial decompression, bursectomy, evaluation of the rotator cuff, possible rotator cuff repair, and a further tearing was noted.  Risks and benefits were discussed including bleeding, infection, damage to vascular structures, no change in symptoms, worsening symptoms, need for repeat debridement, DVT, PE, anesthetic complications etc.  TECHNIQUE:  With the patient in supine position after induction of adequate general anesthesia, 2 g Kefzol, left shoulder and upper extremity were prepped and draped in the usual sterile fashion.  Exam under anesthesia revealed full range.  Surgical marker was utilized to delineate the acromion, AC joint and  coracoid.  Standard posterolateral and anterolateral incisions were made for portals with incision through the skin only with the arm in 70-30 position with gentle traction applied.  We advanced the arthroscopic camera into the glenohumeral joint penetrating atraumatically.  Visualization revealed with internal- external rotation normal rotator cuff.  She had some tearing anteriorly and posteriorly of the labrum, however, it was attached.  Biceps tendon was unremarkable.  Mild glenohumeral degenerative changes.  The subscapularis was unremarkable as well.  Under direct visualization, localized with an anterior portal, with an 18-gauge needle just beneath the biceps tendon, made a small incision, advanced a cannula, penetrating the capsule just beneath the biceps tendon.  Introduced a shaver and debrided the labrum to a stable base anteriorly and posteriorly and lavaged the joint.  I then redirected the arthroscopic camera in the subacromial space, and translated anterior portal to the anterolateral portal, triangulating the subacromial space.  Hypertrophic bursa was noted.  I introduced a shaver and performed a full bursectomy anteriorly, posteriorly and laterally.  Full inspection and probing of the rotator cuff revealed minimal fraying.  No significant tearing.  She had ample subacromial space.  We did shave the anterolateral aspect of the acromion, partially reduced her CA ligament.  At the end though I had ample space.  After full bursectomy and again evaluation of the cuff, no further pathology amenable to arthroscopic intervention.  I therefore removed all instrumentation.  Portals were closed with 4-0 nylon simple sutures, 0.25% Marcaine with epinephrine was infiltrated into the joint.  The wound was dressed sterilely.  The patient was awoken without difficulty and transported to the recovery in satisfactory condition.  The patient tolerated the procedure well.  There were no  complications. Assistant was [employer].  Minimal blood loss.     Jene Every, M.D.     Cordelia Pen  D:  02/20/2012  T:  02/21/2012  Job:  [id number]

## 2012-06-16 ENCOUNTER — Other Ambulatory Visit: Payer: Self-pay | Admitting: *Deleted

## 2012-06-16 DIAGNOSIS — Z1231 Encounter for screening mammogram for malignant neoplasm of breast: Secondary | ICD-10-CM

## 2012-07-01 ENCOUNTER — Other Ambulatory Visit: Payer: Self-pay | Admitting: Internal Medicine

## 2012-07-01 DIAGNOSIS — M1990 Unspecified osteoarthritis, unspecified site: Secondary | ICD-10-CM

## 2012-07-01 DIAGNOSIS — IMO0002 Reserved for concepts with insufficient information to code with codable children: Secondary | ICD-10-CM

## 2012-07-05 ENCOUNTER — Ambulatory Visit
Admission: RE | Admit: 2012-07-05 | Discharge: 2012-07-05 | Disposition: A | Discharge status: Home / Self Care | Payer: Medicare Other | Source: Ambulatory Visit | Attending: Internal Medicine | Admitting: Internal Medicine

## 2012-07-05 DIAGNOSIS — Z1231 Encounter for screening mammogram for malignant neoplasm of breast: Secondary | ICD-10-CM

## 2012-07-06 ENCOUNTER — Ambulatory Visit
Admission: RE | Admit: 2012-07-06 | Discharge: 2012-07-06 | Disposition: A | Discharge status: Home / Self Care | Payer: Medicare Other | Source: Ambulatory Visit | Attending: Internal Medicine | Admitting: Internal Medicine

## 2012-07-06 ENCOUNTER — Other Ambulatory Visit: Payer: Self-pay | Admitting: Internal Medicine

## 2012-07-06 VITALS — BP 150/89 | HR 72

## 2012-07-06 DIAGNOSIS — M549 Dorsalgia, unspecified: Secondary | ICD-10-CM

## 2012-07-06 DIAGNOSIS — M1990 Unspecified osteoarthritis, unspecified site: Secondary | ICD-10-CM

## 2012-07-06 DIAGNOSIS — IMO0002 Reserved for concepts with insufficient information to code with codable children: Secondary | ICD-10-CM

## 2012-07-06 MED ORDER — IOHEXOL 180 MG/ML  SOLN
1.0000 mL | Freq: Once | INTRAMUSCULAR | Status: AC | PRN
  Administered 2012-07-06: 1 mL via EPIDURAL

## 2012-07-06 MED ORDER — METHYLPREDNISOLONE ACETATE 40 MG/ML INJ SUSP (RADIOLOG
120.0000 mg | Freq: Once | INTRAMUSCULAR | Status: AC
  Administered 2012-07-06: 120 mg via EPIDURAL

## 2012-07-22 ENCOUNTER — Ambulatory Visit
Admission: RE | Admit: 2012-07-22 | Discharge: 2012-07-22 | Disposition: A | Discharge status: Home / Self Care | Payer: Medicare Other | Source: Ambulatory Visit | Attending: Internal Medicine | Admitting: Internal Medicine

## 2012-07-22 VITALS — BP 133/85 | HR 73

## 2012-07-22 DIAGNOSIS — M549 Dorsalgia, unspecified: Secondary | ICD-10-CM

## 2012-07-22 MED ORDER — METHYLPREDNISOLONE ACETATE 40 MG/ML INJ SUSP (RADIOLOG
120.0000 mg | Freq: Once | INTRAMUSCULAR | Status: AC
  Administered 2012-07-22: 120 mg via EPIDURAL

## 2012-07-22 MED ORDER — IOHEXOL 180 MG/ML  SOLN
1.0000 mL | Freq: Once | INTRAMUSCULAR | Status: AC | PRN
  Administered 2012-07-22: 1 mL via EPIDURAL

## 2012-08-23 ENCOUNTER — Emergency Department (HOSPITAL_COMMUNITY): Payer: Medicare Other

## 2012-08-23 ENCOUNTER — Emergency Department (HOSPITAL_COMMUNITY)
Admission: EM | Admit: 2012-08-23 | Discharge: 2012-08-23 | Disposition: A | Discharge status: Home / Self Care | Payer: Medicare Other | Attending: Emergency Medicine | Admitting: Emergency Medicine

## 2012-08-23 ENCOUNTER — Encounter (HOSPITAL_COMMUNITY): Payer: Self-pay | Admitting: *Deleted

## 2012-08-23 DIAGNOSIS — Z8739 Personal history of other diseases of the musculoskeletal system and connective tissue: Secondary | ICD-10-CM | POA: Insufficient documentation

## 2012-08-23 DIAGNOSIS — Z79899 Other long term (current) drug therapy: Secondary | ICD-10-CM | POA: Insufficient documentation

## 2012-08-23 DIAGNOSIS — Z8719 Personal history of other diseases of the digestive system: Secondary | ICD-10-CM | POA: Insufficient documentation

## 2012-08-23 DIAGNOSIS — F333 Major depressive disorder, recurrent, severe with psychotic symptoms: Secondary | ICD-10-CM | POA: Insufficient documentation

## 2012-08-23 DIAGNOSIS — R079 Chest pain, unspecified: Secondary | ICD-10-CM

## 2012-08-23 DIAGNOSIS — I2510 Atherosclerotic heart disease of native coronary artery without angina pectoris: Secondary | ICD-10-CM | POA: Insufficient documentation

## 2012-08-23 DIAGNOSIS — K219 Gastro-esophageal reflux disease without esophagitis: Secondary | ICD-10-CM

## 2012-08-23 DIAGNOSIS — I10 Essential (primary) hypertension: Secondary | ICD-10-CM | POA: Insufficient documentation

## 2012-08-23 LAB — CBC WITH DIFFERENTIAL/PLATELET
Basophils Absolute: 0 10*3/uL (ref 0.0–0.1)
Basophils Relative: 0 % (ref 0–1)
Eosinophils Absolute: 0.1 10*3/uL (ref 0.0–0.7)
Eosinophils Relative: 1 % (ref 0–5)
HCT: 39.3 % (ref 36.0–46.0)
MCH: 28.2 pg (ref 26.0–34.0)
MCHC: 33.8 g/dL (ref 30.0–36.0)
MCV: 83.4 fL (ref 78.0–100.0)
Monocytes Absolute: 0.4 10*3/uL (ref 0.1–1.0)
RDW: 13.1 % (ref 11.5–15.5)

## 2012-08-23 LAB — BASIC METABOLIC PANEL
BUN: 14 mg/dL (ref 6–23)
CO2: 24 mEq/L (ref 19–32)
Calcium: 9.4 mg/dL (ref 8.4–10.5)
Chloride: 104 mEq/L (ref 96–112)
Creatinine, Ser: 1.16 mg/dL — ABNORMAL HIGH (ref 0.50–1.10)

## 2012-08-23 MED ORDER — NITROGLYCERIN 0.4 MG SL SUBL
0.4000 mg | SUBLINGUAL_TABLET | SUBLINGUAL | Status: DC | PRN
  Administered 2012-08-23: 0.4 mg via SUBLINGUAL
  Filled 2012-08-23: qty 25

## 2012-08-23 MED ORDER — GI COCKTAIL ~~LOC~~
30.0000 mL | Freq: Once | ORAL | Status: AC
  Administered 2012-08-23: 30 mL via ORAL
  Filled 2012-08-23: qty 30

## 2012-08-23 NOTE — ED Notes (Signed)
Pt reports chest "discomfort" over last few days. Worse when lying down. Denies shob, n/v, diaphoresis. Describes it as sharp and burning.

## 2012-08-23 NOTE — Progress Notes (Signed)
Correction talbot not talbert

## 2012-08-23 NOTE — ED Provider Notes (Signed)
History     CSN: [id number]  Arrival date & time 08/23/12  1448   First MD Initiated Contact with Patient 08/23/12 1620      Chief Complaint  Patient presents with  . Chest Pain    (Consider location/radiation/quality/duration/timing/severity/associated sxs/prior treatment) Patient is a 51 y.o. female presenting with chest pain. The history is provided by the patient. No language interpreter was used.  Chest Pain The chest pain began 3 - 5 days ago. Chest pain occurs constantly. The chest pain is unchanged. At its most intense, the pain is at 6/10. The pain is currently at 6/10. The severity of the pain is mild. The quality of the pain is described as burning. Pertinent negatives for primary symptoms include no fever, no fatigue, no shortness of breath, no cough, no nausea, no vomiting and no dizziness.  Pertinent negatives for associated symptoms include no diaphoresis, no lower extremity edema, no near-syncope, no numbness and no weakness. She tried proton pump inhibitors for the symptoms. Risk factors include post-menopausal, smoking/tobacco exposure and obesity.  Her past medical history is significant for anxiety/panic attacks, hyperlipidemia and hypertension.  Pertinent negatives for past medical history include no aneurysm, no arrhythmia, no cancer, no COPD, no CHF, no diabetes, no DVT, no MI, no pacemaker, no PE, no PVD, no rheumatic fever, no seizures, no sickle cell disease, no strokes, no thyroid problem and no TIA.  Her family medical history is significant for CAD in family, diabetes in family, heart disease in family, hyperlipidemia in family and hypertension in family.  Pertinent negatives for family medical history include: no PE in family, no stroke in family, no sudden death in family and no TIA in family.  Procedure history is positive for cardiac catheterization and exercise treadmill test.    51yo female with midsternal burning x 2 days worse when she lays down.   States that she tried vinegar  And tums for the pain.  States the tums helped the pain from a 6/10 down to a 4/10.  She is burping frequently.  Patient has hypertension and high cholesterol and a hiatal hernia .  Her last endoscopy was in 1991.  She had a cardiac cath in 2009 that showed 50% RCA.  Cardiologist is Dr.  Fu.  Pcp is Dr. Reche Dixon.  States she had barbeque chicken for supper last night and that make the pain worse.  pmh listed below.    Past Medical History  Diagnosis Date  . Hypertension   . GERD (gastroesophageal reflux disease)   . H/O hiatal hernia   . Severe major depression with psychotic features   . Impingement syndrome of left shoulder   . Coronary artery disease CARDIOLOGIST-- DR  Fu--  WILL REQUEST NOTE    MILD NON-OBSTRUCTIVE CAD    Past Surgical History  Procedure Date  . Cardiac catheterization 01-20-2008   DR SKAINS    MILD CAD/ RCA WITH UP TO 50% PROXIMAL /  DIFFUSE DISTAL IRREGULARITIES UP TO 30%/ NORMAL LVEF  . Tonsillectomy and adenoidectomy AS CHILD    AND PALETTE REPAIR  . Total abdominal hysterectomy w/ bilateral salpingoophorectomy 2001  . Bilateral carpal tunnel release 2006  &  2009  . Shoulder surgery     Left    No family history on file.  History  Substance Use Topics  . Smoking status: Never Smoker   . Smokeless tobacco: Never Used  . Alcohol Use: No    OB History    Grav Para Term Preterm Abortions TAB  SAB Ect Mult Living                  Review of Systems  Constitutional: Negative.  Negative for fever, diaphoresis and fatigue.  HENT: Negative.   Eyes: Negative.   Respiratory: Negative.  Negative for cough and shortness of breath.   Cardiovascular: Positive for chest pain. Negative for near-syncope.  Gastrointestinal: Negative.  Negative for nausea and vomiting.  Neurological: Negative.  Negative for dizziness, seizures, syncope, weakness, light-headedness and numbness.  Psychiatric/Behavioral: Negative.   All other  systems reviewed and are negative.    Allergies  Adhesive  Home Medications   Current Outpatient Rx  Name Route Sig Dispense Refill  . AMLODIPINE BESYLATE 5 MG PO TABS Oral Take 5 mg by mouth every morning.     . ASPIRIN EC 81 MG PO TBEC Oral Take 81 mg by mouth every evening.     Marland Kitchen BUPROPION HCL ER (XL) 150 MG PO TB24 Oral Take 150 mg by mouth 2 (two) times daily. Takes x2 tabs in am and x1 tab in pm    . OMEPRAZOLE 10 MG PO CPDR Oral Take 10 mg by mouth every morning.    Marland Kitchen PERPHENAZINE 4 MG PO TABS Oral Take 2-6 mg by mouth every evening. 0.5 tab in am, 1.5 tab in pm    . PRAVASTATIN SODIUM 40 MG PO TABS Oral Take 40 mg by mouth at bedtime.    . SERTRALINE HCL 100 MG PO TABS Oral Take 200 mg by mouth daily.      BP 119/81  Pulse 95  Temp 99.2 F (37.3 C) (Oral)  Resp 14  SpO2 99%  Physical Exam  Nursing note and vitals reviewed. Constitutional: She is oriented to person, place, and time. She appears well-developed and well-nourished.       obese  HENT:  Head: Normocephalic and atraumatic.  Eyes: Conjunctivae normal and EOM are normal. Pupils are equal, round, and reactive to light.  Neck: Normal range of motion. Neck supple.  Cardiovascular: Normal rate, regular rhythm, normal heart sounds and intact distal pulses.  Exam reveals no gallop and no friction rub.   No murmur heard. Pulmonary/Chest: Effort normal and breath sounds normal. No respiratory distress. She has no wheezes. She has no rales. She exhibits no tenderness.  Abdominal: Soft. Bowel sounds are normal.  Musculoskeletal: Normal range of motion. She exhibits no edema and no tenderness.  Neurological: She is alert and oriented to person, place, and time. She has normal reflexes.  Skin: Skin is warm and dry.  Psychiatric: She has a normal mood and affect.    ED Course  Procedures (including critical care time)   Labs Reviewed  BASIC METABOLIC PANEL  CBC WITH DIFFERENTIAL   Dg Chest Port 1  View  08/23/2012  *RADIOLOGY REPORT*  Clinical Data: Chest pain.  PORTABLE CHEST - 1 VIEW  Comparison: 01/29/2012  Findings: The lungs are clear.  Heart size is normal.  No edema or infiltrate identified.  No evidence of pleural fluid.  The bony thorax is unremarkable.  IMPRESSION: No active disease.   Original Report Authenticated By: Reola Calkins, M.D.      No diagnosis found.    MDM  51yo female with mid sternal burning with pmh of 50% RCA per cardiac cath in 2009.  Suspect this is GERD. Pain was gone after GI cocktail.  EKG with no changes.  Troponin negative.  All other labs unremarkable.  Chest x-ray reviewed by  myself with no active disease.  Consulted with Dr. Tamel Abel Fu and he will have close follow up in the office this week.  Patient will continue her prilosec daily until follow up.  Patient understands to return for SOB, severe pain or any concerns.    Labs Reviewed  BASIC METABOLIC PANEL - Abnormal; Notable for the following:    Creatinine, Ser 1.16 (*)     GFR calc non Af Amer 54 (*)     GFR calc Af Amer 63 (*)     All other components within normal limits  CBC WITH DIFFERENTIAL  POCT I-STAT TROPONIN I  LAB REPORT - SCANNED     Date: 08/24/2012  Rate: 98   Rhythm: normal sinus rhythm  QRS Axis: normal  Intervals: normal  ST/T Wave abnormalities: normal  Conduction Disutrbances:none  Narrative Interpretation: borderline T wave abnormlities diffusely  Old EKG Reviewed: changes noted       Remi Haggard, NP 08/24/12 1753

## 2012-08-23 NOTE — Progress Notes (Signed)
pcp is david talbert [facility] per pt EPIC updated

## 2012-08-25 NOTE — ED Provider Notes (Signed)
Medical screening examination/treatment/procedure(s) were performed by non-physician practitioner and as supervising physician I was immediately available for consultation/collaboration.  Juliet Rude. Rubin Payor, MD 08/25/12 [phone]

## 2012-11-03 ENCOUNTER — Encounter (HOSPITAL_COMMUNITY): Payer: Self-pay | Admitting: Emergency Medicine

## 2012-11-03 ENCOUNTER — Emergency Department (HOSPITAL_COMMUNITY)
Admission: EM | Admit: 2012-11-03 | Discharge: 2012-11-03 | Disposition: A | Discharge status: Home / Self Care | Payer: Medicare Other | Attending: Emergency Medicine | Admitting: Emergency Medicine

## 2012-11-03 ENCOUNTER — Emergency Department (HOSPITAL_COMMUNITY): Payer: Medicare Other

## 2012-11-03 DIAGNOSIS — F32A Depression, unspecified: Secondary | ICD-10-CM | POA: Insufficient documentation

## 2012-11-03 DIAGNOSIS — R51 Headache: Secondary | ICD-10-CM | POA: Insufficient documentation

## 2012-11-03 DIAGNOSIS — Z9889 Other specified postprocedural states: Secondary | ICD-10-CM | POA: Insufficient documentation

## 2012-11-03 DIAGNOSIS — F323 Major depressive disorder, single episode, severe with psychotic features: Secondary | ICD-10-CM | POA: Insufficient documentation

## 2012-11-03 DIAGNOSIS — Z79899 Other long term (current) drug therapy: Secondary | ICD-10-CM | POA: Insufficient documentation

## 2012-11-03 DIAGNOSIS — Z7982 Long term (current) use of aspirin: Secondary | ICD-10-CM | POA: Insufficient documentation

## 2012-11-03 DIAGNOSIS — R42 Dizziness and giddiness: Secondary | ICD-10-CM | POA: Insufficient documentation

## 2012-11-03 DIAGNOSIS — Z90710 Acquired absence of both cervix and uterus: Secondary | ICD-10-CM | POA: Insufficient documentation

## 2012-11-03 DIAGNOSIS — Z8739 Personal history of other diseases of the musculoskeletal system and connective tissue: Secondary | ICD-10-CM | POA: Insufficient documentation

## 2012-11-03 DIAGNOSIS — Z8719 Personal history of other diseases of the digestive system: Secondary | ICD-10-CM | POA: Insufficient documentation

## 2012-11-03 DIAGNOSIS — K219 Gastro-esophageal reflux disease without esophagitis: Secondary | ICD-10-CM | POA: Insufficient documentation

## 2012-11-03 DIAGNOSIS — I10 Essential (primary) hypertension: Secondary | ICD-10-CM | POA: Insufficient documentation

## 2012-11-03 DIAGNOSIS — Z8679 Personal history of other diseases of the circulatory system: Secondary | ICD-10-CM | POA: Insufficient documentation

## 2012-11-03 LAB — POCT I-STAT, CHEM 8
Calcium, Ion: 1.17 mmol/L (ref 1.12–1.23)
Creatinine, Ser: 1.1 mg/dL (ref 0.50–1.10)
Glucose, Bld: 114 mg/dL — ABNORMAL HIGH (ref 70–99)
Hemoglobin: 13.9 g/dL (ref 12.0–15.0)
Potassium: 3.7 mEq/L (ref 3.5–5.1)
TCO2: 28 mmol/L (ref 0–100)

## 2012-11-03 MED ORDER — DEXTROSE 5 % IV SOLN: 1.0000 g | Freq: Once | INTRAVENOUS | Status: DC

## 2012-11-03 MED ORDER — AMLODIPINE BESYLATE 5 MG PO TABS: 10.0000 mg | ORAL_TABLET | Freq: Every morning | ORAL | Status: DC

## 2012-11-03 MED ORDER — DIPHENHYDRAMINE HCL 50 MG/ML IJ SOLN: 25.0000 mg | Freq: Once | INTRAMUSCULAR | Status: DC

## 2012-11-03 NOTE — ED Provider Notes (Signed)
History     CSN: [id number]  Arrival date & time 11/03/12  0701   First MD Initiated Contact with Patient 11/03/12 [phone]      Chief Complaint  Patient presents with  . Headache  . Dizziness     HPI States that she has been dizzy and "lightheaded" for 1 month was given Meclazine by her PCP and is ineffective. States that she began having a headache yesterday.  Patient states that her blood pressure is been elevated the last several days.  Past Medical History  Diagnosis Date  . Hypertension   . GERD (gastroesophageal reflux disease)   . H/O hiatal hernia   . Severe major depression with psychotic features   . Impingement syndrome of left shoulder   . Coronary artery disease CARDIOLOGIST-- DR Anne Fu--  WILL REQUEST NOTE    MILD NON-OBSTRUCTIVE CAD    Past Surgical History  Procedure Date  . Cardiac catheterization 01-20-2008   DR SKAINS    MILD CAD/ RCA WITH UP TO 50% PROXIMAL /  DIFFUSE DISTAL IRREGULARITIES UP TO 30%/ NORMAL LVEF  . Tonsillectomy and adenoidectomy AS CHILD    AND PALETTE REPAIR  . Total abdominal hysterectomy w/ bilateral salpingoophorectomy 2001  . Bilateral carpal tunnel release 2006  &  2009  . Shoulder surgery     Left    No family history on file.  History  Substance Use Topics  . Smoking status: Never Smoker   . Smokeless tobacco: Never Used  . Alcohol Use: No    OB History    Grav Para Term Preterm Abortions TAB SAB Ect Mult Living                  Review of Systems  All other systems reviewed and are negative.    Allergies  Adhesive  Home Medications   Current Outpatient Rx  Name  Route  Sig  Dispense  Refill  . ASPIRIN EC 81 MG PO TBEC   Oral   Take 81 mg by mouth every evening.          Marland Kitchen BUPROPION HCL ER (XL) 150 MG PO TB24   Oral   Take 150 mg by mouth 2 (two) times daily. Takes x2 tabs in am and x1 tab in pm         . MECLIZINE HCL 25 MG PO TABS   Oral   Take 25 mg by mouth 3 (three) times daily as needed.  Dizziness         . OMEPRAZOLE 40 MG PO CPDR   Oral   Take 40 mg by mouth 2 (two) times daily.         Marland Kitchen PERPHENAZINE 4 MG PO TABS   Oral   Take 2-6 mg by mouth every evening. 0.5 tab in am, 1.5 tab in pm         . PRAVASTATIN SODIUM 40 MG PO TABS   Oral   Take 40 mg by mouth at bedtime.         . SERTRALINE HCL 100 MG PO TABS   Oral   Take 200 mg by mouth daily.         Marland Kitchen AMLODIPINE BESYLATE 5 MG PO TABS   Oral   Take 2 tablets (10 mg total) by mouth every morning.   10 tablet   0     BP 145/74  Pulse 95  Temp 98.1 F (36.7 C) (Oral)  Resp 16  Ht 5'  8" (1.727 m)  Wt 253 lb 12.8 oz (115.123 kg)  BMI 38.59 kg/m2  SpO2 99%  Physical Exam  Nursing note and vitals reviewed. Constitutional: She is oriented to person, place, and time. She appears well-developed and well-nourished. No distress.  HENT:  Head: Normocephalic and atraumatic.  Eyes: Pupils are equal, round, and reactive to light.  Neck: Normal range of motion.  Cardiovascular: Normal rate and intact distal pulses.   Pulmonary/Chest: No respiratory distress.  Abdominal: Normal appearance. She exhibits no distension.  Musculoskeletal: Normal range of motion.  Neurological: She is alert and oriented to person, place, and time. No cranial nerve deficit.  Skin: Skin is warm and dry. No rash noted.  Psychiatric: She has a normal mood and affect. Her behavior is normal.    ED Course  Procedures (including critical care time)  Labs Reviewed  POCT I-STAT, CHEM 8 - Abnormal; Notable for the following:    Glucose, Bld 114 (*)     All other components within normal limits   Ct Head Wo Contrast  11/03/2012  *RADIOLOGY REPORT*  Clinical Data: Dizziness and headache.  CT HEAD WITHOUT CONTRAST  Technique:  Contiguous axial images were obtained from the base of the skull through the vertex without contrast.  Comparison: None.  Findings: No evidence of acute infarct, acute hemorrhage, mass lesion, mass effect  or hydrocephalus.  Visualized portions of the paranasal sinuses and mastoid air cells are clear.  IMPRESSION: Negative.   Original Report Authenticated By: Leanna Battles, M.D.      1. Dizziness   2. Headache   3. Hypertension       MDM  Plan: We'll increase amlodipine to 10 mg daily for 5 days with close followup with her PCP       Nelia Shi, MD 11/03/12 [phone]

## 2012-11-03 NOTE — ED Notes (Signed)
States that she has been dizzy and "lightheaded" for 1 month was given Meclazine by her PCP and is ineffective. States that she began having a headache yesterday.

## 2013-03-07 ENCOUNTER — Emergency Department (HOSPITAL_COMMUNITY): Payer: Medicare Other

## 2013-03-07 ENCOUNTER — Emergency Department (HOSPITAL_COMMUNITY)
Admission: EM | Admit: 2013-03-07 | Discharge: 2013-03-07 | Disposition: A | Discharge status: Home / Self Care | Payer: Medicare Other | Attending: Emergency Medicine | Admitting: Emergency Medicine

## 2013-03-07 ENCOUNTER — Encounter (HOSPITAL_COMMUNITY): Payer: Self-pay | Admitting: Emergency Medicine

## 2013-03-07 DIAGNOSIS — F323 Major depressive disorder, single episode, severe with psychotic features: Secondary | ICD-10-CM | POA: Insufficient documentation

## 2013-03-07 DIAGNOSIS — Y929 Unspecified place or not applicable: Secondary | ICD-10-CM | POA: Insufficient documentation

## 2013-03-07 DIAGNOSIS — Z8719 Personal history of other diseases of the digestive system: Secondary | ICD-10-CM | POA: Insufficient documentation

## 2013-03-07 DIAGNOSIS — R296 Repeated falls: Secondary | ICD-10-CM | POA: Insufficient documentation

## 2013-03-07 DIAGNOSIS — I10 Essential (primary) hypertension: Secondary | ICD-10-CM | POA: Insufficient documentation

## 2013-03-07 DIAGNOSIS — S6000XA Contusion of unspecified finger without damage to nail, initial encounter: Secondary | ICD-10-CM | POA: Insufficient documentation

## 2013-03-07 DIAGNOSIS — Y93E1 Activity, personal bathing and showering: Secondary | ICD-10-CM | POA: Insufficient documentation

## 2013-03-07 DIAGNOSIS — Z79899 Other long term (current) drug therapy: Secondary | ICD-10-CM | POA: Insufficient documentation

## 2013-03-07 DIAGNOSIS — W2209XA Striking against other stationary object, initial encounter: Secondary | ICD-10-CM | POA: Insufficient documentation

## 2013-03-07 DIAGNOSIS — K219 Gastro-esophageal reflux disease without esophagitis: Secondary | ICD-10-CM | POA: Insufficient documentation

## 2013-03-07 DIAGNOSIS — Z7982 Long term (current) use of aspirin: Secondary | ICD-10-CM | POA: Insufficient documentation

## 2013-03-07 DIAGNOSIS — Z8659 Personal history of other mental and behavioral disorders: Secondary | ICD-10-CM | POA: Insufficient documentation

## 2013-03-07 DIAGNOSIS — I2510 Atherosclerotic heart disease of native coronary artery without angina pectoris: Secondary | ICD-10-CM | POA: Insufficient documentation

## 2013-03-07 DIAGNOSIS — S8001XA Contusion of right knee, initial encounter: Secondary | ICD-10-CM

## 2013-03-07 DIAGNOSIS — Z8739 Personal history of other diseases of the musculoskeletal system and connective tissue: Secondary | ICD-10-CM | POA: Insufficient documentation

## 2013-03-07 DIAGNOSIS — Z9889 Other specified postprocedural states: Secondary | ICD-10-CM | POA: Insufficient documentation

## 2013-03-07 DIAGNOSIS — S8000XA Contusion of unspecified knee, initial encounter: Secondary | ICD-10-CM | POA: Insufficient documentation

## 2013-03-07 MED ORDER — IBUPROFEN 800 MG PO TABS: 800.0000 mg | ORAL_TABLET | Freq: Three times a day (TID) | ORAL | Status: DC | PRN

## 2013-03-07 MED ORDER — HYDROCODONE-ACETAMINOPHEN 5-325 MG PO TABS: 1.0000 | ORAL_TABLET | Freq: Four times a day (QID) | ORAL | Status: DC | PRN

## 2013-03-07 NOTE — ED Provider Notes (Signed)
Medical screening examination/treatment/procedure(s) were performed by non-physician practitioner and as supervising physician I was immediately available for consultation/collaboration.   Jhane Lorio Y. Govanni Plemons, MD 03/07/13 2323 

## 2013-03-07 NOTE — ED Notes (Signed)
Pt fell in bathtub, knee struck bottom of bathtub. Denies dizziness or LOC

## 2013-03-07 NOTE — ED Provider Notes (Signed)
History    This chart was scribed for non-physician practitioner Ebbie Ridge, PA-C working with Gavin Pound. Oletta Lamas, MD by Gerlean Ren, ED Scribe. This patient was seen in room WTR8/WTR8 and the patient's care was started at 5:30 PM.    CSN: [id number]  Arrival date & time 03/07/13  1722   First MD Initiated Contact with Patient 03/07/13 1728      No chief complaint on file.   The history is provided by the patient. No language interpreter was used.  [name] is a 52 y.o. female who presents to the Emergency Department complaining of right knee pain worse medially that began when pt fell getting into the bath tub 2 nights ago.  Pt denies head trauma, LOC, or any further injuries or pains as a result.  Pt states when seated there is no pain.  Pain increases when bearing weight and when changing positions.  Pt has not used any OCM for pain.   Pt also c/o subungual hematoma to left thumb from accidentally slamming it in a door several week ago.      Past Medical History  Diagnosis Date  . Hypertension   . GERD (gastroesophageal reflux disease)   . H/O hiatal hernia   . Severe major depression with psychotic features   . Impingement syndrome of left shoulder   . Coronary artery disease CARDIOLOGIST-- DR Anne Fu--  WILL REQUEST NOTE    MILD NON-OBSTRUCTIVE CAD    Past Surgical History  Procedure Laterality Date  . Cardiac catheterization  01-20-2008   DR SKAINS    MILD CAD/ RCA WITH UP TO 50% PROXIMAL /  DIFFUSE DISTAL IRREGULARITIES UP TO 30%/ NORMAL LVEF  . Tonsillectomy and adenoidectomy  AS CHILD    AND PALETTE REPAIR  . Total abdominal hysterectomy w/ bilateral salpingoophorectomy  2001  . Bilateral carpal tunnel release  2006  &  2009  . Shoulder surgery      Left    No family history on file.  History  Substance Use Topics  . Smoking status: Never Smoker   . Smokeless tobacco: Never Used  . Alcohol Use: No    No OB history provided.   Review of Systems A complete  10 system review of systems was obtained and all systems are negative except as noted in the HPI and PMH.   Allergies  Adhesive  Home Medications   Current Outpatient Rx  Name  Route  Sig  Dispense  Refill  . amLODipine (NORVASC) 5 MG tablet   Oral   Take 2 tablets (10 mg total) by mouth every morning.   10 tablet   0   . aspirin EC 81 MG tablet   Oral   Take 81 mg by mouth every evening.          Marland Kitchen buPROPion (WELLBUTRIN XL) 150 MG 24 hr tablet   Oral   Take 150 mg by mouth 2 (two) times daily. Takes x2 tabs in am and x1 tab in pm         . meclizine (ANTIVERT) 25 MG tablet   Oral   Take 25 mg by mouth 3 (three) times daily as needed. Dizziness         . omeprazole (PRILOSEC) 40 MG capsule   Oral   Take 40 mg by mouth 2 (two) times daily.         Marland Kitchen perphenazine (TRILAFON) 4 MG tablet   Oral   Take 2-6 mg by mouth  every evening. 0.5 tab in am, 1.5 tab in pm         . pravastatin (PRAVACHOL) 40 MG tablet   Oral   Take 40 mg by mouth at bedtime.         . sertraline (ZOLOFT) 100 MG tablet   Oral   Take 200 mg by mouth daily.           BP 122/72  Pulse 92  Temp(Src) 98.4 F (36.9 C) (Oral)  Wt 250 lb (113.399 kg)  BMI 38.02 kg/m2  SpO2 99%  Physical Exam  Nursing note and vitals reviewed. Constitutional: She is oriented to person, place, and time. She appears well-developed and well-nourished. No distress.  HENT:  Head: Normocephalic and atraumatic.  Eyes: Pupils are equal, round, and reactive to light.  Neck: Neck supple. No tracheal deviation present.  Cardiovascular: Normal rate and regular rhythm.   Pulmonary/Chest: Effort normal and breath sounds normal. No respiratory distress.  Musculoskeletal:       Right knee: She exhibits decreased range of motion, swelling and ecchymosis. She exhibits no effusion, no deformity, normal alignment and normal patellar mobility. Tenderness found. Medial joint line and MCL tenderness noted. No patellar  tendon tenderness noted.       Hands: Neurological: She is alert and oriented to person, place, and time.  Skin: Skin is warm and dry.  Psychiatric: She has a normal mood and affect. Her behavior is normal.    ED Course  Procedures (including critical care time) DIAGNOSTIC STUDIES: Oxygen Saturation is 99% on room air, normal by my interpretation.    COORDINATION OF CARE: 6:33 PM- Discussed right knee XR with pt.  Educated pt that the left thumbnail may fall off and that further intervention for the nail is not necessary at this time.  She verbalizes understanding and agrees.     Dg Knee Complete 4 Views Right  03/07/2013  *RADIOLOGY REPORT*  Clinical Data: Fall 3 days ago.  Anterior pain.  RIGHT KNEE - COMPLETE 4+ VIEW  Comparison: 03/03/2005 MR.  Findings: No fracture or dislocation.  IMPRESSION: No fracture.   Original Report Authenticated By: Lacy Duverney, M.D.     Patient will be referred to ortho. Told to return here as needed. Ice and elevate the knee. Knee immobilizer and crutches given.   MDM  I personally performed the services described in this documentation, which was scribed in my presence. The recorded information has been reviewed and is accurate.'   Carlyle Dolly, PA-C 03/07/13 1837

## 2013-06-09 ENCOUNTER — Other Ambulatory Visit: Payer: Self-pay

## 2013-06-09 DIAGNOSIS — Z1231 Encounter for screening mammogram for malignant neoplasm of breast: Secondary | ICD-10-CM

## 2013-07-06 ENCOUNTER — Ambulatory Visit: Payer: Medicare Other

## 2013-07-07 ENCOUNTER — Ambulatory Visit
Admission: RE | Admit: 2013-07-07 | Discharge: 2013-07-07 | Disposition: A | Discharge status: Home / Self Care | Payer: Medicare Other | Source: Ambulatory Visit

## 2013-07-07 DIAGNOSIS — Z1231 Encounter for screening mammogram for malignant neoplasm of breast: Secondary | ICD-10-CM

## 2013-08-30 ENCOUNTER — Other Ambulatory Visit: Payer: Self-pay | Admitting: Internal Medicine

## 2013-08-30 DIAGNOSIS — M549 Dorsalgia, unspecified: Secondary | ICD-10-CM

## 2013-08-31 ENCOUNTER — Other Ambulatory Visit: Payer: Medicare Other

## 2013-09-02 ENCOUNTER — Other Ambulatory Visit: Payer: Medicare Other

## 2013-09-05 ENCOUNTER — Ambulatory Visit
Admission: RE | Admit: 2013-09-05 | Discharge: 2013-09-05 | Disposition: A | Discharge status: Home / Self Care | Payer: Medicare Other | Source: Ambulatory Visit | Attending: Internal Medicine | Admitting: Internal Medicine

## 2013-09-05 VITALS — BP 147/84 | HR 78

## 2013-09-05 DIAGNOSIS — M549 Dorsalgia, unspecified: Secondary | ICD-10-CM

## 2013-09-05 MED ORDER — IOHEXOL 180 MG/ML  SOLN
1.0000 mL | Freq: Once | INTRAMUSCULAR | Status: AC | PRN
  Administered 2013-09-05: 1 mL via EPIDURAL

## 2013-09-05 MED ORDER — METHYLPREDNISOLONE ACETATE 40 MG/ML INJ SUSP (RADIOLOG
120.0000 mg | Freq: Once | INTRAMUSCULAR | Status: AC
  Administered 2013-09-05: 120 mg via EPIDURAL

## 2013-09-15 ENCOUNTER — Other Ambulatory Visit: Payer: Self-pay | Admitting: Internal Medicine

## 2013-09-15 DIAGNOSIS — M549 Dorsalgia, unspecified: Secondary | ICD-10-CM

## 2013-09-29 IMAGING — CR DG CHEST 1V PORT
1 series · 1 of 1 positions shown · non-contrast
Comparison: 03/03/2010

CLINICAL DATA: Chest pain and weakness

PORTABLE CHEST - 1 VIEW

[AP]
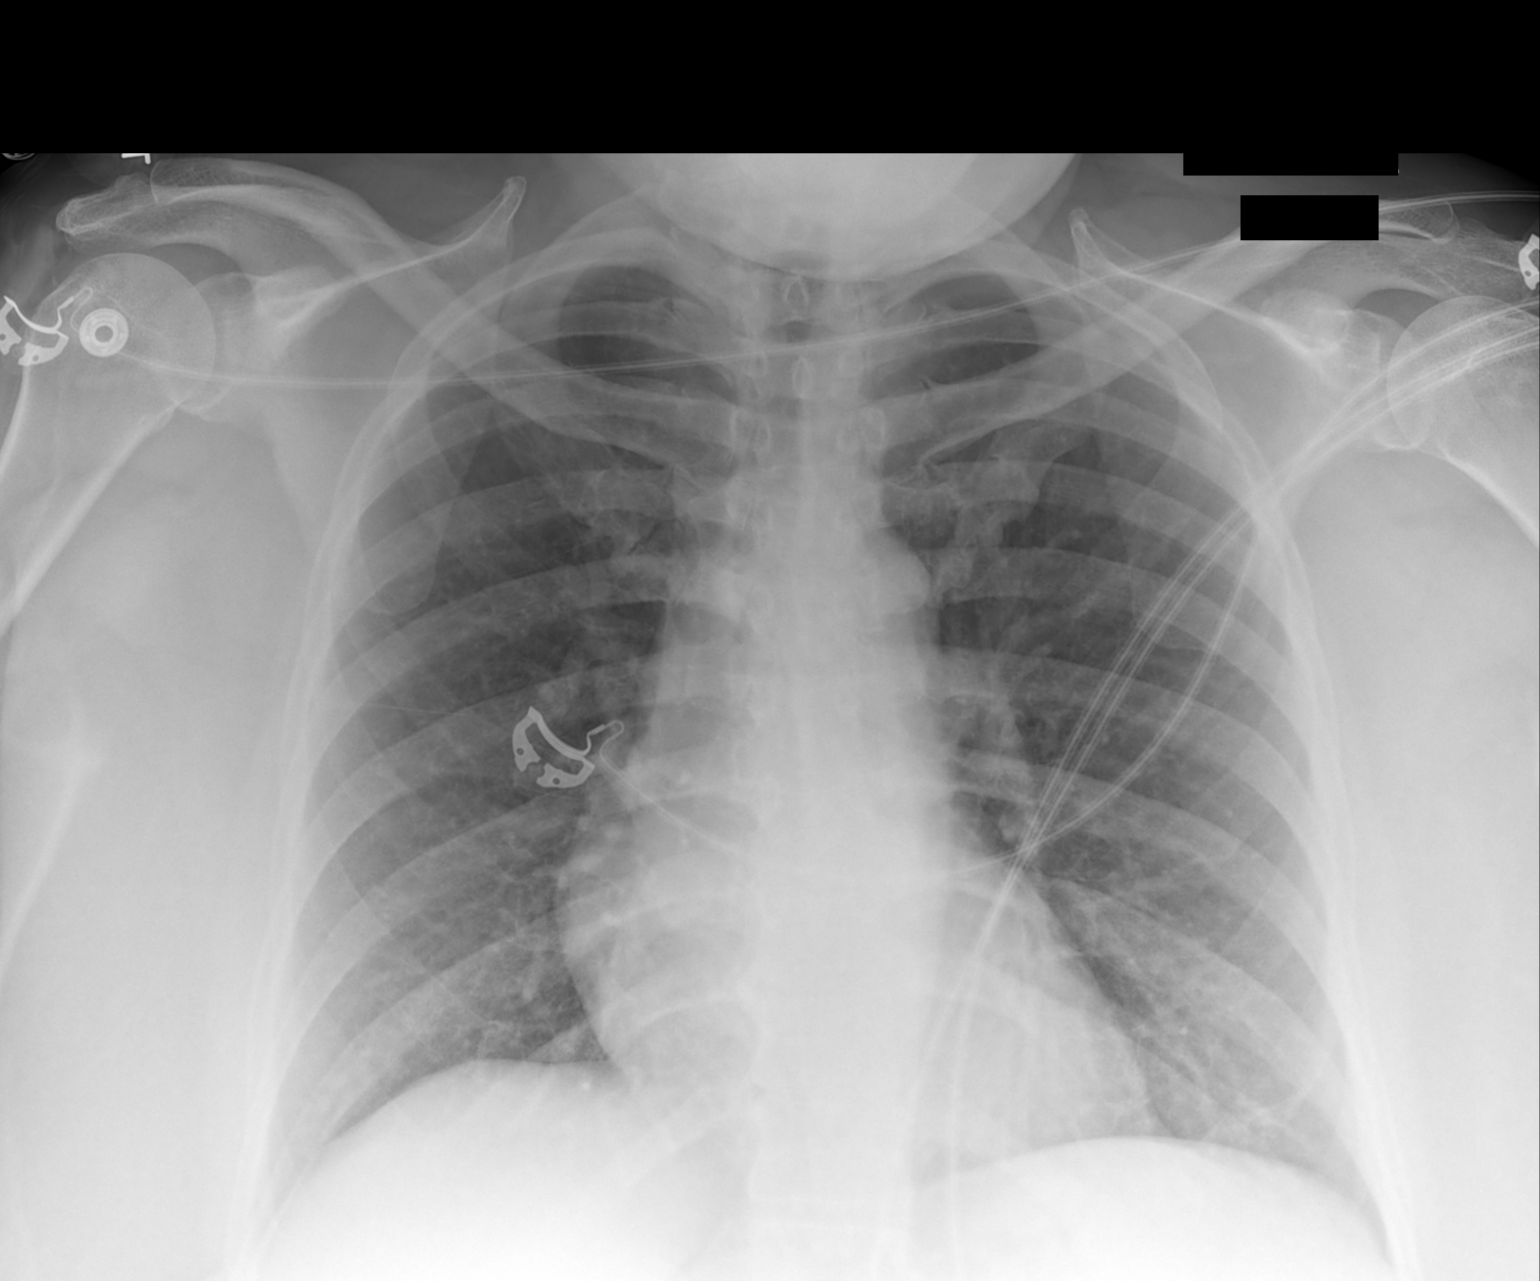

[1 of 1 positions shown; findings below may reference images not displayed]

FINDINGS: Normal heart size.  Linear atelectasis at the left base.
Otherwise clear lungs.  No pneumothorax or pleural effusion.
IMPRESSION: No active cardiopulmonary disease.

## 2013-09-30 ENCOUNTER — Ambulatory Visit
Admission: RE | Admit: 2013-09-30 | Discharge: 2013-09-30 | Disposition: A | Discharge status: Home / Self Care | Payer: Medicare Other | Source: Ambulatory Visit | Attending: Internal Medicine | Admitting: Internal Medicine

## 2013-09-30 VITALS — BP 156/89 | HR 92

## 2013-09-30 DIAGNOSIS — M549 Dorsalgia, unspecified: Secondary | ICD-10-CM

## 2013-09-30 MED ORDER — IOHEXOL 180 MG/ML  SOLN
1.0000 mL | Freq: Once | INTRAMUSCULAR | Status: AC | PRN
  Administered 2013-09-30: 1 mL via EPIDURAL

## 2013-09-30 MED ORDER — METHYLPREDNISOLONE ACETATE 40 MG/ML INJ SUSP (RADIOLOG
120.0000 mg | Freq: Once | INTRAMUSCULAR | Status: AC
  Administered 2013-09-30: 120 mg via EPIDURAL

## 2013-10-06 ENCOUNTER — Other Ambulatory Visit: Payer: Medicare Other

## 2013-12-27 ENCOUNTER — Other Ambulatory Visit: Payer: Self-pay | Admitting: Internal Medicine

## 2013-12-27 DIAGNOSIS — M549 Dorsalgia, unspecified: Secondary | ICD-10-CM

## 2014-01-05 ENCOUNTER — Ambulatory Visit
Admission: RE | Admit: 2014-01-05 | Discharge: 2014-01-05 | Disposition: A | Discharge status: Home / Self Care | Payer: Medicare Other | Source: Ambulatory Visit | Attending: Internal Medicine | Admitting: Internal Medicine

## 2014-01-05 VITALS — BP 135/87 | HR 88

## 2014-01-05 DIAGNOSIS — M549 Dorsalgia, unspecified: Secondary | ICD-10-CM

## 2014-01-05 MED ORDER — IOHEXOL 180 MG/ML  SOLN
1.0000 mL | Freq: Once | INTRAMUSCULAR | Status: AC | PRN
  Administered 2014-01-05: 1 mL via EPIDURAL

## 2014-01-05 MED ORDER — METHYLPREDNISOLONE ACETATE 40 MG/ML INJ SUSP (RADIOLOG
120.0000 mg | Freq: Once | INTRAMUSCULAR | Status: AC
Start: 2014-01-05 — End: 2014-01-05
  Administered 2014-01-05: 120 mg via EPIDURAL

## 2014-03-16 ENCOUNTER — Encounter (HOSPITAL_COMMUNITY): Payer: Self-pay | Admitting: Emergency Medicine

## 2014-03-16 ENCOUNTER — Emergency Department (HOSPITAL_COMMUNITY)
Admission: EM | Admit: 2014-03-16 | Discharge: 2014-03-16 | Disposition: A | Discharge status: Home / Self Care | Payer: Medicare Other | Attending: Emergency Medicine | Admitting: Emergency Medicine

## 2014-03-16 DIAGNOSIS — G8929 Other chronic pain: Secondary | ICD-10-CM | POA: Insufficient documentation

## 2014-03-16 DIAGNOSIS — Z9889 Other specified postprocedural states: Secondary | ICD-10-CM | POA: Insufficient documentation

## 2014-03-16 DIAGNOSIS — E876 Hypokalemia: Secondary | ICD-10-CM

## 2014-03-16 DIAGNOSIS — Z7982 Long term (current) use of aspirin: Secondary | ICD-10-CM | POA: Insufficient documentation

## 2014-03-16 DIAGNOSIS — Z79899 Other long term (current) drug therapy: Secondary | ICD-10-CM | POA: Insufficient documentation

## 2014-03-16 DIAGNOSIS — F323 Major depressive disorder, single episode, severe with psychotic features: Secondary | ICD-10-CM | POA: Insufficient documentation

## 2014-03-16 DIAGNOSIS — IMO0002 Reserved for concepts with insufficient information to code with codable children: Secondary | ICD-10-CM | POA: Insufficient documentation

## 2014-03-16 DIAGNOSIS — I2510 Atherosclerotic heart disease of native coronary artery without angina pectoris: Secondary | ICD-10-CM | POA: Insufficient documentation

## 2014-03-16 DIAGNOSIS — M545 Low back pain, unspecified: Secondary | ICD-10-CM

## 2014-03-16 DIAGNOSIS — E663 Overweight: Secondary | ICD-10-CM | POA: Insufficient documentation

## 2014-03-16 DIAGNOSIS — K219 Gastro-esophageal reflux disease without esophagitis: Secondary | ICD-10-CM | POA: Insufficient documentation

## 2014-03-16 DIAGNOSIS — R209 Unspecified disturbances of skin sensation: Secondary | ICD-10-CM | POA: Insufficient documentation

## 2014-03-16 DIAGNOSIS — I10 Essential (primary) hypertension: Secondary | ICD-10-CM | POA: Insufficient documentation

## 2014-03-16 LAB — D-DIMER, QUANTITATIVE (NOT AT ARMC): D DIMER QUANT: 0.29 ug{FEU}/mL (ref 0.00–0.48)

## 2014-03-16 LAB — CBC WITH DIFFERENTIAL/PLATELET
Basophils Absolute: 0 10*3/uL (ref 0.0–0.1)
Basophils Relative: 1 % (ref 0–1)
Eosinophils Absolute: 0.2 10*3/uL (ref 0.0–0.7)
Eosinophils Relative: 4 % (ref 0–5)
HEMATOCRIT: 38.9 % (ref 36.0–46.0)
HEMOGLOBIN: 12.9 g/dL (ref 12.0–15.0)
LYMPHS PCT: 50 % — AB (ref 12–46)
Lymphs Abs: 2.7 10*3/uL (ref 0.7–4.0)
MCH: 27.8 pg (ref 26.0–34.0)
MCHC: 33.2 g/dL (ref 30.0–36.0)
MCV: 83.8 fL (ref 78.0–100.0)
MONO ABS: 0.3 10*3/uL (ref 0.1–1.0)
MONOS PCT: 6 % (ref 3–12)
NEUTROS ABS: 2 10*3/uL (ref 1.7–7.7)
Neutrophils Relative %: 39 % — ABNORMAL LOW (ref 43–77)
Platelets: 251 10*3/uL (ref 150–400)
RBC: 4.64 MIL/uL (ref 3.87–5.11)
RDW: 13.5 % (ref 11.5–15.5)
WBC: 5.3 10*3/uL (ref 4.0–10.5)

## 2014-03-16 LAB — BASIC METABOLIC PANEL
BUN: 12 mg/dL (ref 6–23)
CHLORIDE: 104 meq/L (ref 96–112)
CO2: 26 meq/L (ref 19–32)
CREATININE: 1 mg/dL (ref 0.50–1.10)
Calcium: 8.9 mg/dL (ref 8.4–10.5)
GFR calc Af Amer: 74 mL/min — ABNORMAL LOW (ref >90)
GFR calc non Af Amer: 64 mL/min — ABNORMAL LOW (ref >90)
Glucose, Bld: 137 mg/dL — ABNORMAL HIGH (ref 70–99)
Potassium: 3.2 mEq/L — ABNORMAL LOW (ref 3.7–5.3)
Sodium: 145 mEq/L (ref 137–147)

## 2014-03-16 MED ORDER — TRAMADOL HCL 50 MG PO TABS: 50.0000 mg | ORAL_TABLET | Freq: Four times a day (QID) | ORAL | Status: DC | PRN

## 2014-03-16 MED ORDER — POTASSIUM CHLORIDE CRYS ER 20 MEQ PO TBCR
40.0000 meq | EXTENDED_RELEASE_TABLET | Freq: Once | ORAL | Status: AC
  Administered 2014-03-16: 40 meq via ORAL
  Filled 2014-03-16: qty 2

## 2014-03-16 MED ORDER — TRAMADOL HCL 50 MG PO TABS
50.0000 mg | ORAL_TABLET | Freq: Once | ORAL | Status: AC
  Administered 2014-03-16: 50 mg via ORAL
  Filled 2014-03-16: qty 1

## 2014-03-16 MED ORDER — POTASSIUM CHLORIDE CRYS ER 20 MEQ PO TBCR: 20.0000 meq | EXTENDED_RELEASE_TABLET | Freq: Every day | ORAL | Status: DC

## 2014-03-16 MED ORDER — PREDNISONE 20 MG PO TABS: 40.0000 mg | ORAL_TABLET | Freq: Every day | ORAL | Status: DC

## 2014-03-16 NOTE — Discharge Instructions (Signed)
For pain control you may take:  acetaminophen 975mg  (this is 3 over the counter pills) four times a day. Do not drink alcohol or combine with other medications that have acetaminophen as an ingredient (Read the labels!).  For breakthrough pain you may take Tramadol. Do not drink alcohol drive or operate heavy machinery when taking Tramadol.   Back Pain, Adult Back pain is very common. The pain often gets better over time. The cause of back pain is usually not dangerous. Most people can learn to manage their back pain on their own.  HOME CARE   Stay active. Start with short walks on flat ground if you can. Try to walk farther each day.  Do not sit, drive, or stand in one place for more than 30 minutes. Do not stay in bed.  Do not avoid exercise or work. Activity can help your back heal faster.  Be careful when you bend or lift an object. Bend at your knees, keep the object close to you, and do not twist.  Sleep on a firm mattress. Lie on your side, and bend your knees. If you lie on your back, put a pillow under your knees.  Only take medicines as told by your doctor.  Put ice on the injured area.  Put ice in a plastic bag.  Place a towel between your skin and the bag.  Leave the ice on for 15-20 minutes, 03-04 times a day for the first 2 to 3 days. After that, you can switch between ice and heat packs.  Ask your doctor about back exercises or massage.  Avoid feeling anxious or stressed. Find good ways to deal with stress, such as exercise. GET HELP RIGHT AWAY IF:   Your pain does not go away with rest or medicine.  Your pain does not go away in 1 week.  You have new problems.  You do not feel well.  The pain spreads into your legs.  You cannot control when you poop (bowel movement) or pee (urinate).  Your arms or legs feel weak or lose feeling (numbness).  You feel sick to your stomach (nauseous) or throw up (vomit).  You have belly (abdominal) pain.  You feel like  you may pass out (faint). MAKE SURE YOU:   Understand these instructions.  Will watch your condition.  Will get help right away if you are not doing well or get worse. Document Released: 03/31/2008 Document Revised: 01/05/2012 Document Reviewed: 03/03/2011 [facility] Patient Information 2014 [place].

## 2014-03-16 NOTE — ED Notes (Signed)
Pt presents with c/o back pain, chronic in nature, that has now moved down to her legs. Pt says her legs are tingling down to her feet. Pt ambulatory to triage. Pt denies any heat, redness, or swelling on her legs.

## 2014-03-16 NOTE — ED Provider Notes (Signed)
CSN: [id number]     Arrival date & time 03/16/14  1754 History  This chart was scribed for non-physician practitioner Monico Blitz  working with Dorie Rank, MD by Mercy Moore, ED Scribe. This patient was seen in room [facility] and the patient's care was started at 6:26 PM.   Chief Complaint  Patient presents with  . Leg Pain  . Back Pain    The history is provided by the patient. No language interpreter was used.   HPI Comments: [name] is a 53 y.o. female with history of chronic back pain who presents to the Emergency Department complaining of worsening lower back pain with radiation to legs bilaterally. Patient states that she has been experiencing the back pain and tingling in her legs and feet for a few days now. Patient describes sharp pain in her left leg and and a throbbing pain in her right. Last night pain reports experiencing a new cramping pain and pain with tactile pressure. Patient has attempted treatment with elevation and ibuprofen, but states she only takes the ibuprofen occasionally because it irritates her stomach. Patient states that she in not currently experiencing severe pain, but the discomfort in her legs and feet did keep her from sleeping well last night. Patient states that she is ambulatory, with pain. Patient denies recent trauma or injury for causation of present pain. Patient denies bladder or bowel incontinence. No medical history of cancer or diabetes. Patient denies illicit drug use. No history of back surgeries. Patient states that she is specifically concerned about DVT. Denies exogenous estrogen, recent immobilization, swelling.   Patient is being followed by Dr. Tonita Cong at [facility] for her chronic pain but shares that she has not seen him recently.   Past Medical History  Diagnosis Date  . Hypertension   . GERD (gastroesophageal reflux disease)   . H/O hiatal hernia   . Severe major depression with psychotic features   . Impingement  syndrome of left shoulder   . Coronary artery disease CARDIOLOGIST-- DR Marlou Porch--  WILL REQUEST NOTE    MILD NON-OBSTRUCTIVE CAD   Past Surgical History  Procedure Laterality Date  . Cardiac catheterization  01-20-2008   DR SKAINS    MILD CAD/ RCA WITH UP TO 50% PROXIMAL /  DIFFUSE DISTAL IRREGULARITIES UP TO 30%/ NORMAL LVEF  . Tonsillectomy and adenoidectomy  AS CHILD    AND PALETTE REPAIR  . Total abdominal hysterectomy w/ bilateral salpingoophorectomy  2001  . Bilateral carpal tunnel release  2006  &  2009  . Shoulder surgery      Left   Family History  Problem Relation Age of Onset  . Hypertension Mother    History  Substance Use Topics  . Smoking status: Never Smoker   . Smokeless tobacco: Never Used  . Alcohol Use: No   OB History   Grav Para Term Preterm Abortions TAB SAB Ect Mult Living                 Review of Systems  Constitutional: Negative for fever.  Genitourinary: Negative for dysuria.  Musculoskeletal: Positive for back pain.  Neurological: Positive for numbness.      Allergies  Adhesive  Home Medications   Prior to Admission medications   Medication Sig Start Date End Date Taking? Authorizing Provider  amLODipine (NORVASC) 5 MG tablet Take 2 tablets (10 mg total) by mouth every morning. 11/03/12   Dot Lanes, MD  aspirin EC 81 MG tablet Take 81  mg by mouth every evening.     Historical Provider, MD  buPROPion (WELLBUTRIN XL) 150 MG 24 hr tablet Take 150 mg by mouth 2 (two) times daily. Takes x2 tabs in am and x1 tab in pm    Historical Provider, MD  HYDROcodone-acetaminophen (NORCO/VICODIN) 5-325 MG per tablet Take 1 tablet by mouth every 6 (six) hours as needed for pain. 03/07/13   [place], PA-C  hydrOXYzine (VISTARIL) 50 MG capsule Take 50 mg by mouth 2 (two) times daily as needed for anxiety.    Historical Provider, MD  ibuprofen (ADVIL,MOTRIN) 800 MG tablet Take 1 tablet (800 mg total) by mouth every 8 (eight) hours as needed for  pain. 03/07/13   Resa Miner Lawyer, PA-C  meclizine (ANTIVERT) 25 MG tablet Take 25 mg by mouth 3 (three) times daily as needed. Dizziness    Historical Provider, MD  omeprazole (PRILOSEC) 40 MG capsule Take 40 mg by mouth 2 (two) times daily.    Historical Provider, MD  perphenazine (TRILAFON) 4 MG tablet Take 2-6 mg by mouth every evening. 0.5 tab in am, 1.5 tab in pm    Historical Provider, MD  pravastatin (PRAVACHOL) 80 MG tablet Take 80 mg by mouth daily.    Historical Provider, MD  sertraline (ZOLOFT) 100 MG tablet Take 200 mg by mouth daily.    Historical Provider, MD   Triage Vitals: BP 126/69  Pulse 97  Temp(Src) 98.6 F (37 C) (Oral)  Resp 18  SpO2 96% Physical Exam  Nursing note and vitals reviewed. Constitutional: She is oriented to person, place, and time. She appears well-developed and well-nourished. No distress.  Overweight  HENT:  Head: Normocephalic and atraumatic.  Eyes: EOM are normal. Pupils are equal, round, and reactive to light.  Neck: Normal range of motion. Neck supple. No tracheal deviation present.  Cardiovascular: Normal rate, regular rhythm and intact distal pulses.   Pulmonary/Chest: Effort normal and breath sounds normal. No respiratory distress. She has no wheezes. She has no rales. She exhibits no tenderness.  Abdominal: Soft. Bowel sounds are normal. She exhibits no distension. There is no tenderness. There is no rebound and no guarding.  Musculoskeletal: She exhibits tenderness. She exhibits no edema.  Left cuff tenderness to palpation, no calf asymmetry, superficial collaterals, edema or warmth.  Neurological: She is alert and oriented to person, place, and time.  Skin: Skin is warm and dry.  Psychiatric: She has a normal mood and affect. Her behavior is normal.    ED Course  Procedures (including critical care time) DIAGNOSTIC STUDIES: Oxygen Saturation is 96% on room air, adequate by my interpretation.    COORDINATION OF CARE: 6:31 PM-  Discussed treatment plan with patient at bedside and patient agreed to plan.     Labs Review Labs Reviewed  CBC WITH DIFFERENTIAL - Abnormal; Notable for the following:    Neutrophils Relative % 39 (*)    Lymphocytes Relative 50 (*)    All other components within normal limits  BASIC METABOLIC PANEL - Abnormal; Notable for the following:    Potassium 3.2 (*)    Glucose, Bld 137 (*)    GFR calc non Af Amer 64 (*)    GFR calc Af Amer 74 (*)    All other components within normal limits  D-DIMER, QUANTITATIVE    Imaging Review No results found.   EKG Interpretation None      MDM   Final diagnoses:  Low back pain  Hypokalemia  Filed Vitals:   03/16/14 1817  BP: 126/69  Pulse: 97  Temp: 98.6 F (37 C)  TempSrc: Oral  Resp: 18  SpO2: 96%    Medications  potassium chloride SA (K-DUR,KLOR-CON) CR tablet 40 mEq (not administered)  traMADol (ULTRAM) tablet 50 mg (50 mg Oral Given 03/16/14 1857)    SHAKARI QAZI is a 53 y.o. female presenting with a lumbar radiculopathy radiating down both legs. This is typical for her however there is A. pain and swelling to the left side it is atypical. Patient is low-risk spelled criteria and pertinent negative. She specifically concerned about DVT and is requesting testing. Dimer is negative. CBC within normal, potassium is mildly decreased at 3.2 we'll supplement orally. Patient will be given steroid burst and tramadol and asked to follow with her orthopedist.  Evaluation does not show pathology that would require ongoing emergent intervention or inpatient treatment. Pt is hemodynamically stable and mentating appropriately. Discussed findings and plan with patient/guardian, who agrees with care plan. All questions answered. Return precautions discussed and outpatient follow up given.   New Prescriptions   POTASSIUM CHLORIDE SA (K-DUR,KLOR-CON) 20 MEQ TABLET    Take 1 tablet (20 mEq total) by mouth daily.   PREDNISONE (DELTASONE) 20  MG TABLET    Take 2 tablets (40 mg total) by mouth daily.   TRAMADOL (ULTRAM) 50 MG TABLET    Take 1 tablet (50 mg total) by mouth every 6 (six) hours as needed.    Note: Portions of this report may have been transcribed using voice recognition software. Every effort was made to ensure accuracy; however, inadvertent computerized transcription errors may be present   I personally performed the services described in this documentation, which was scribed in my presence. The recorded information has been reviewed and is accurate.     Monico Blitz, PA-C 03/16/14 2020

## 2014-03-20 NOTE — ED Provider Notes (Signed)
Medical screening examination/treatment/procedure(s) were performed by non-physician practitioner and as supervising physician I was immediately available for consultation/collaboration.    Dorie Rank, MD 03/20/14 [phone]

## 2014-05-19 ENCOUNTER — Encounter: Payer: Self-pay | Admitting: Cardiology

## 2014-05-19 DIAGNOSIS — F323 Major depressive disorder, single episode, severe with psychotic features: Secondary | ICD-10-CM | POA: Insufficient documentation

## 2014-05-19 DIAGNOSIS — K219 Gastro-esophageal reflux disease without esophagitis: Secondary | ICD-10-CM | POA: Insufficient documentation

## 2014-05-19 DIAGNOSIS — I2510 Atherosclerotic heart disease of native coronary artery without angina pectoris: Secondary | ICD-10-CM | POA: Insufficient documentation

## 2014-05-19 DIAGNOSIS — M7542 Impingement syndrome of left shoulder: Secondary | ICD-10-CM | POA: Insufficient documentation

## 2014-05-19 DIAGNOSIS — Z8719 Personal history of other diseases of the digestive system: Secondary | ICD-10-CM | POA: Insufficient documentation

## 2014-05-19 DIAGNOSIS — I10 Essential (primary) hypertension: Secondary | ICD-10-CM | POA: Insufficient documentation

## 2014-06-12 ENCOUNTER — Other Ambulatory Visit: Payer: Self-pay

## 2014-06-12 DIAGNOSIS — Z1231 Encounter for screening mammogram for malignant neoplasm of breast: Secondary | ICD-10-CM

## 2014-07-10 ENCOUNTER — Encounter (INDEPENDENT_AMBULATORY_CARE_PROVIDER_SITE_OTHER): Payer: Self-pay

## 2014-07-10 ENCOUNTER — Ambulatory Visit
Admission: RE | Admit: 2014-07-10 | Discharge: 2014-07-10 | Disposition: A | Discharge status: Home / Self Care | Payer: Medicare Other | Source: Ambulatory Visit

## 2014-07-10 DIAGNOSIS — Z1231 Encounter for screening mammogram for malignant neoplasm of breast: Secondary | ICD-10-CM

## 2014-10-05 ENCOUNTER — Other Ambulatory Visit: Payer: Self-pay | Admitting: Internal Medicine

## 2014-10-05 DIAGNOSIS — M545 Low back pain, unspecified: Secondary | ICD-10-CM

## 2014-10-05 DIAGNOSIS — G8929 Other chronic pain: Secondary | ICD-10-CM

## 2014-10-12 ENCOUNTER — Ambulatory Visit
Admission: RE | Admit: 2014-10-12 | Discharge: 2014-10-12 | Disposition: A | Discharge status: Home / Self Care | Payer: Medicare Other | Source: Ambulatory Visit | Attending: Internal Medicine | Admitting: Internal Medicine

## 2014-10-12 DIAGNOSIS — M545 Low back pain, unspecified: Secondary | ICD-10-CM

## 2014-10-12 DIAGNOSIS — G8929 Other chronic pain: Secondary | ICD-10-CM

## 2014-10-12 MED ORDER — METHYLPREDNISOLONE ACETATE 40 MG/ML INJ SUSP (RADIOLOG
120.0000 mg | Freq: Once | INTRAMUSCULAR | Status: AC
  Administered 2014-10-12: 120 mg via EPIDURAL

## 2014-10-12 MED ORDER — IOHEXOL 180 MG/ML  SOLN
1.0000 mL | Freq: Once | INTRAMUSCULAR | Status: AC | PRN
  Administered 2014-10-12: 1 mL via EPIDURAL

## 2014-10-12 NOTE — Discharge Instructions (Signed)

## 2014-11-01 ENCOUNTER — Other Ambulatory Visit: Payer: Self-pay | Admitting: Internal Medicine

## 2014-11-01 DIAGNOSIS — M545 Low back pain: Principal | ICD-10-CM

## 2014-11-01 DIAGNOSIS — G8929 Other chronic pain: Secondary | ICD-10-CM

## 2014-11-07 ENCOUNTER — Ambulatory Visit
Admission: RE | Admit: 2014-11-07 | Discharge: 2014-11-07 | Disposition: A | Discharge status: Home / Self Care | Payer: Medicare Other | Source: Ambulatory Visit | Attending: Internal Medicine | Admitting: Internal Medicine

## 2014-11-07 DIAGNOSIS — G8929 Other chronic pain: Secondary | ICD-10-CM

## 2014-11-07 DIAGNOSIS — M545 Low back pain: Principal | ICD-10-CM

## 2014-11-07 MED ORDER — IOHEXOL 180 MG/ML  SOLN
1.0000 mL | Freq: Once | INTRAMUSCULAR | Status: AC | PRN
  Administered 2014-11-07: 1 mL via EPIDURAL

## 2014-11-07 MED ORDER — METHYLPREDNISOLONE ACETATE 40 MG/ML INJ SUSP (RADIOLOG
120.0000 mg | Freq: Once | INTRAMUSCULAR | Status: AC
  Administered 2014-11-07: 120 mg via EPIDURAL

## 2015-01-25 ENCOUNTER — Other Ambulatory Visit: Payer: Self-pay | Admitting: Internal Medicine

## 2015-01-25 DIAGNOSIS — M545 Low back pain: Principal | ICD-10-CM

## 2015-01-25 DIAGNOSIS — G8929 Other chronic pain: Secondary | ICD-10-CM

## 2015-01-29 ENCOUNTER — Ambulatory Visit
Admission: RE | Admit: 2015-01-29 | Discharge: 2015-01-29 | Disposition: A | Discharge status: Home / Self Care | Payer: Medicare Other | Source: Ambulatory Visit | Attending: Internal Medicine | Admitting: Internal Medicine

## 2015-01-29 DIAGNOSIS — M545 Low back pain, unspecified: Secondary | ICD-10-CM

## 2015-01-29 DIAGNOSIS — G8929 Other chronic pain: Secondary | ICD-10-CM

## 2015-01-29 MED ORDER — IOHEXOL 180 MG/ML  SOLN
1.0000 mL | Freq: Once | INTRAMUSCULAR | Status: AC | PRN
  Administered 2015-01-29: 1 mL via EPIDURAL

## 2015-01-29 MED ORDER — METHYLPREDNISOLONE ACETATE 40 MG/ML INJ SUSP (RADIOLOG
120.0000 mg | Freq: Once | INTRAMUSCULAR | Status: AC
  Administered 2015-01-29: 120 mg via EPIDURAL

## 2015-06-12 ENCOUNTER — Other Ambulatory Visit: Payer: Self-pay

## 2015-06-12 DIAGNOSIS — Z1231 Encounter for screening mammogram for malignant neoplasm of breast: Secondary | ICD-10-CM

## 2015-07-12 ENCOUNTER — Ambulatory Visit
Admission: RE | Admit: 2015-07-12 | Discharge: 2015-07-12 | Disposition: A | Discharge status: Home / Self Care | Payer: Medicare Other | Source: Ambulatory Visit

## 2015-07-12 ENCOUNTER — Other Ambulatory Visit: Payer: Self-pay | Admitting: Internal Medicine

## 2015-07-12 DIAGNOSIS — Z1231 Encounter for screening mammogram for malignant neoplasm of breast: Secondary | ICD-10-CM

## 2015-07-12 DIAGNOSIS — R928 Other abnormal and inconclusive findings on diagnostic imaging of breast: Secondary | ICD-10-CM

## 2015-07-17 ENCOUNTER — Ambulatory Visit
Admission: RE | Admit: 2015-07-17 | Discharge: 2015-07-17 | Disposition: A | Discharge status: Home / Self Care | Payer: Medicare Other | Source: Ambulatory Visit | Attending: Internal Medicine | Admitting: Internal Medicine

## 2015-07-17 DIAGNOSIS — R928 Other abnormal and inconclusive findings on diagnostic imaging of breast: Secondary | ICD-10-CM

## 2015-12-03 DIAGNOSIS — J309 Allergic rhinitis, unspecified: Secondary | ICD-10-CM | POA: Insufficient documentation

## 2015-12-03 DIAGNOSIS — F32A Depression, unspecified: Secondary | ICD-10-CM | POA: Insufficient documentation

## 2015-12-03 DIAGNOSIS — E785 Hyperlipidemia, unspecified: Secondary | ICD-10-CM | POA: Insufficient documentation

## 2015-12-03 DIAGNOSIS — F329 Major depressive disorder, single episode, unspecified: Secondary | ICD-10-CM | POA: Insufficient documentation

## 2015-12-03 DIAGNOSIS — Z1211 Encounter for screening for malignant neoplasm of colon: Secondary | ICD-10-CM | POA: Insufficient documentation

## 2015-12-03 DIAGNOSIS — Z8719 Personal history of other diseases of the digestive system: Secondary | ICD-10-CM | POA: Insufficient documentation

## 2015-12-03 DIAGNOSIS — M7540 Impingement syndrome of unspecified shoulder: Secondary | ICD-10-CM | POA: Insufficient documentation

## 2015-12-03 DIAGNOSIS — I2510 Atherosclerotic heart disease of native coronary artery without angina pectoris: Secondary | ICD-10-CM | POA: Insufficient documentation

## 2015-12-13 ENCOUNTER — Other Ambulatory Visit: Payer: Self-pay | Admitting: Internal Medicine

## 2015-12-13 DIAGNOSIS — M545 Low back pain, unspecified: Secondary | ICD-10-CM

## 2015-12-20 DIAGNOSIS — I25811 Atherosclerosis of native coronary artery of transplanted heart without angina pectoris: Secondary | ICD-10-CM | POA: Insufficient documentation

## 2015-12-20 DIAGNOSIS — G479 Sleep disorder, unspecified: Secondary | ICD-10-CM | POA: Insufficient documentation

## 2015-12-20 DIAGNOSIS — R079 Chest pain, unspecified: Secondary | ICD-10-CM | POA: Insufficient documentation

## 2015-12-26 ENCOUNTER — Other Ambulatory Visit: Payer: Self-pay | Admitting: Internal Medicine

## 2015-12-26 DIAGNOSIS — G8929 Other chronic pain: Secondary | ICD-10-CM

## 2015-12-26 DIAGNOSIS — M545 Low back pain: Principal | ICD-10-CM

## 2016-01-02 ENCOUNTER — Ambulatory Visit
Admission: RE | Admit: 2016-01-02 | Discharge: 2016-01-02 | Disposition: A | Discharge status: Home / Self Care | Payer: Medicare Other | Source: Ambulatory Visit | Attending: Internal Medicine | Admitting: Internal Medicine

## 2016-01-02 DIAGNOSIS — G8929 Other chronic pain: Secondary | ICD-10-CM

## 2016-01-02 DIAGNOSIS — M545 Low back pain: Principal | ICD-10-CM

## 2016-01-02 MED ORDER — IOHEXOL 180 MG/ML  SOLN
1.0000 mL | Freq: Once | INTRAMUSCULAR | Status: AC | PRN
  Administered 2016-01-02: 1 mL via EPIDURAL

## 2016-01-02 MED ORDER — METHYLPREDNISOLONE ACETATE 40 MG/ML INJ SUSP (RADIOLOG
120.0000 mg | Freq: Once | INTRAMUSCULAR | Status: AC
  Administered 2016-01-02: 120 mg via EPIDURAL

## 2016-01-02 NOTE — Discharge Instructions (Signed)

## 2016-01-11 ENCOUNTER — Other Ambulatory Visit: Payer: Self-pay | Admitting: Internal Medicine

## 2016-01-11 DIAGNOSIS — G8929 Other chronic pain: Secondary | ICD-10-CM

## 2016-01-11 DIAGNOSIS — M545 Low back pain, unspecified: Secondary | ICD-10-CM

## 2016-01-21 ENCOUNTER — Ambulatory Visit
Admission: RE | Admit: 2016-01-21 | Discharge: 2016-01-21 | Disposition: A | Discharge status: Home / Self Care | Payer: Medicare Other | Source: Ambulatory Visit | Attending: Internal Medicine | Admitting: Internal Medicine

## 2016-01-21 DIAGNOSIS — M545 Low back pain, unspecified: Secondary | ICD-10-CM

## 2016-01-21 DIAGNOSIS — G8929 Other chronic pain: Secondary | ICD-10-CM

## 2016-01-21 MED ORDER — METHYLPREDNISOLONE ACETATE 40 MG/ML INJ SUSP (RADIOLOG
120.0000 mg | Freq: Once | INTRAMUSCULAR | Status: AC
  Administered 2016-01-21: 120 mg via EPIDURAL

## 2016-01-21 MED ORDER — IOHEXOL 180 MG/ML  SOLN
1.0000 mL | Freq: Once | INTRAMUSCULAR | Status: AC | PRN
  Administered 2016-01-21: 1 mL via EPIDURAL

## 2016-03-17 ENCOUNTER — Encounter (HOSPITAL_COMMUNITY): Payer: Self-pay | Admitting: Emergency Medicine

## 2016-03-17 ENCOUNTER — Emergency Department (HOSPITAL_COMMUNITY)
Admission: EM | Admit: 2016-03-17 | Discharge: 2016-03-17 | Disposition: A | Discharge status: Home / Self Care | Payer: Medicare Other | Attending: Emergency Medicine | Admitting: Emergency Medicine

## 2016-03-17 ENCOUNTER — Emergency Department (HOSPITAL_COMMUNITY): Payer: Medicare Other

## 2016-03-17 DIAGNOSIS — I2510 Atherosclerotic heart disease of native coronary artery without angina pectoris: Secondary | ICD-10-CM | POA: Diagnosis not present

## 2016-03-17 DIAGNOSIS — Z7982 Long term (current) use of aspirin: Secondary | ICD-10-CM | POA: Insufficient documentation

## 2016-03-17 DIAGNOSIS — I10 Essential (primary) hypertension: Secondary | ICD-10-CM | POA: Insufficient documentation

## 2016-03-17 DIAGNOSIS — M7989 Other specified soft tissue disorders: Secondary | ICD-10-CM | POA: Diagnosis present

## 2016-03-17 DIAGNOSIS — Z79899 Other long term (current) drug therapy: Secondary | ICD-10-CM | POA: Insufficient documentation

## 2016-03-17 DIAGNOSIS — M79632 Pain in left forearm: Secondary | ICD-10-CM | POA: Insufficient documentation

## 2016-03-17 LAB — BASIC METABOLIC PANEL
Anion gap: 7 (ref 5–15)
BUN: 11 mg/dL (ref 6–20)
CO2: 25 mmol/L (ref 22–32)
CREATININE: 0.97 mg/dL (ref 0.44–1.00)
Calcium: 8.4 mg/dL — ABNORMAL LOW (ref 8.9–10.3)
Chloride: 108 mmol/L (ref 101–111)
GFR calc non Af Amer: >60 mL/min (ref >60)
Glucose, Bld: 90 mg/dL (ref 65–99)
Potassium: 3.5 mmol/L (ref 3.5–5.1)
Sodium: 140 mmol/L (ref 135–145)

## 2016-03-17 LAB — CBC WITH DIFFERENTIAL/PLATELET
BASOS PCT: 0 %
Basophils Absolute: 0 10*3/uL (ref 0.0–0.1)
EOS PCT: 2 %
Eosinophils Absolute: 0.1 10*3/uL (ref 0.0–0.7)
HCT: 39.4 % (ref 36.0–46.0)
Hemoglobin: 12.9 g/dL (ref 12.0–15.0)
LYMPHS ABS: 2.8 10*3/uL (ref 0.7–4.0)
Lymphocytes Relative: 63 %
MCH: 28.2 pg (ref 26.0–34.0)
MCHC: 32.7 g/dL (ref 30.0–36.0)
MCV: 86 fL (ref 78.0–100.0)
Monocytes Absolute: 0.4 10*3/uL (ref 0.1–1.0)
Monocytes Relative: 10 %
Neutro Abs: 1.1 10*3/uL — ABNORMAL LOW (ref 1.7–7.7)
Neutrophils Relative %: 25 %
Platelets: 254 10*3/uL (ref 150–400)
RBC: 4.58 MIL/uL (ref 3.87–5.11)
RDW: 14.1 % (ref 11.5–15.5)
WBC: 4.4 10*3/uL (ref 4.0–10.5)

## 2016-03-17 MED ORDER — OXYCODONE-ACETAMINOPHEN 5-325 MG PO TABS
1.0000 | ORAL_TABLET | Freq: Once | ORAL | Status: AC
  Administered 2016-03-17: 1 via ORAL
  Filled 2016-03-17: qty 1

## 2016-03-17 MED ORDER — OXYCODONE-ACETAMINOPHEN 5-325 MG PO TABS: 1.0000 | ORAL_TABLET | Freq: Four times a day (QID) | ORAL | Status: DC | PRN

## 2016-03-17 NOTE — ED Notes (Signed)
Attempted to collect labs and was unsuccessful.  I made the nurse aware.

## 2016-03-17 NOTE — Discharge Instructions (Signed)
Follow-up tomorrow at [facility] for the Doppler.  Musculoskeletal Pain Musculoskeletal pain is muscle and boney aches and pains. These pains can occur in any part of the body. Your caregiver may treat you without knowing the cause of the pain. They may treat you if blood or urine tests, X-rays, and other tests were normal.  CAUSES There is often not a definite cause or reason for these pains. These pains may be caused by a type of germ (virus). The discomfort may also come from overuse. Overuse includes working out too hard when your body is not fit. Boney aches also come from weather changes. Bone is sensitive to atmospheric pressure changes. HOME CARE INSTRUCTIONS   Ask when your test results will be ready. Make sure you get your test results.  Only take over-the-counter or prescription medicines for pain, discomfort, or fever as directed by your caregiver. If you were given medications for your condition, do not drive, operate machinery or power tools, or sign legal documents for 24 hours. Do not drink alcohol. Do not take sleeping pills or other medications that may interfere with treatment.  Continue all activities unless the activities cause more pain. When the pain lessens, slowly resume normal activities. Gradually increase the intensity and duration of the activities or exercise.  During periods of severe pain, bed rest may be helpful. Lay or sit in any position that is comfortable.  Putting ice on the injured area.  Put ice in a bag.  Place a towel between your skin and the bag.  Leave the ice on for 15 to 20 minutes, 3 to 4 times a day.  Follow up with your caregiver for continued problems and no reason can be found for the pain. If the pain becomes worse or does not go away, it may be necessary to repeat tests or do additional testing. Your caregiver may need to look further for a possible cause. SEEK IMMEDIATE MEDICAL CARE IF:  You have pain that is getting worse and is not  relieved by medications.  You develop chest pain that is associated with shortness or breath, sweating, feeling sick to your stomach (nauseous), or throw up (vomit).  Your pain becomes localized to the abdomen.  You develop any new symptoms that seem different or that concern you. MAKE SURE YOU:   Understand these instructions.  Will watch your condition.  Will get help right away if you are not doing well or get worse.   This information is not intended to replace advice given to you by your health care provider. Make sure you discuss any questions you have with your health care provider.   Document Released: 10/13/2005 Document Revised: 01/05/2012 Document Reviewed: 06/17/2013 Elsevier Interactive Patient Education [employer].

## 2016-03-17 NOTE — ED Notes (Addendum)
Patient reports swelling to right arm since yesterday. Pulse, sensory, and motor intact. Denies injury/trauma to this extremity

## 2016-03-17 NOTE — ED Provider Notes (Signed)
CSN: [id number]     Arrival date & time 03/17/16  1522 History   First MD Initiated Contact with Patient 03/17/16 1921     Chief Complaint  Patient presents with  . Arm Swelling       The history is provided by the patient.  Patient presents with left forearm pain. Began 2 days ago. Dull. Worse with movements. States she may have slept on 2 days ago last night did not. Worse with movements and palpation. No weakness. No other injury. No chest pain. No smoking. No recent car rides. She does not have cancer.  Past Medical History  Diagnosis Date  . Hypertension   . GERD (gastroesophageal reflux disease)   . H/O hiatal hernia   . Severe major depression with psychotic features ([place])   . Impingement syndrome of left shoulder   . Coronary artery disease CARDIOLOGIST-- DR Marlou Porch--  WILL REQUEST NOTE    MILD NON-OBSTRUCTIVE CAD   Past Surgical History  Procedure Laterality Date  . Cardiac catheterization  01-20-2008   DR SKAINS    MILD CAD/ RCA WITH UP TO 50% PROXIMAL /  DIFFUSE DISTAL IRREGULARITIES UP TO 30%/ NORMAL LVEF  . Tonsillectomy and adenoidectomy  AS CHILD    AND PALETTE REPAIR  . Total abdominal hysterectomy w/ bilateral salpingoophorectomy  2001  . Bilateral carpal tunnel release  2006  &  2009  . Shoulder surgery      Left   Family History  Problem Relation Age of Onset  . Hypertension Mother    Social History  Substance Use Topics  . Smoking status: Never Smoker   . Smokeless tobacco: Never Used  . Alcohol Use: No   OB History    No data available     Review of Systems  Constitutional: Negative for activity change and appetite change.  Eyes: Negative for pain.  Respiratory: Negative for chest tightness and shortness of breath.   Cardiovascular: Negative for chest pain and leg swelling.  Gastrointestinal: Negative for nausea, vomiting, abdominal pain and diarrhea.  Genitourinary: Negative for flank pain.  Musculoskeletal: Negative for back pain, neck pain  and neck stiffness.       Left for pain. Forearm pain.  Skin: Negative for rash and wound.  Neurological: Negative for weakness, numbness and headaches.  Psychiatric/Behavioral: Negative for behavioral problems.      Allergies  Adhesive  Home Medications   Prior to Admission medications   Medication Sig Start Date End Date Taking? Authorizing Provider  amLODipine (NORVASC) 5 MG tablet Take 2 tablets (10 mg total) by mouth every morning. Patient taking differently: Take 5 mg by mouth every morning.  11/03/12  Yes Leonard Schwartz, MD  aspirin EC 81 MG tablet Take 81 mg by mouth every evening.    Yes Historical Provider, MD  atorvastatin (LIPITOR) 80 MG tablet Take 80 mg by mouth every evening. 12/25/15  Yes Historical Provider, MD  buPROPion (WELLBUTRIN XL) 150 MG 24 hr tablet Take 150-300 mg by mouth 2 (two) times daily. Takes x2 tabs in am and x1 tab in pm   Yes Historical Provider, MD  cyclobenzaprine (FLEXERIL) 10 MG tablet Take 10 mg by mouth 2 (two) times daily as needed. Muscle spasms 03/03/16  Yes Historical Provider, MD  HYDROcodone-acetaminophen (NORCO/VICODIN) 5-325 MG per tablet Take 1 tablet by mouth every 6 (six) hours as needed for pain. 03/07/13  Yes Christopher Lawyer, PA-C  hydrOXYzine (VISTARIL) 50 MG capsule Take 50 mg by mouth 2 (two) times  daily as needed for anxiety.   Yes Historical Provider, MD  ibuprofen (ADVIL,MOTRIN) 200 MG tablet Take 200-400 mg by mouth every 6 (six) hours as needed for headache or moderate pain.   Yes Historical Provider, MD  omeprazole (PRILOSEC) 40 MG capsule Take 40 mg by mouth daily.    Yes Historical Provider, MD  perphenazine (TRILAFON) 4 MG tablet Take 2-6 mg by mouth 2 (two) times daily. 0.5 tab in am, 1.5 tab in pm   Yes Historical Provider, MD  sertraline (ZOLOFT) 100 MG tablet Take 200 mg by mouth daily.   Yes Historical Provider, MD  oxyCODONE-acetaminophen (PERCOCET/ROXICET) 5-325 MG tablet Take 1-2 tablets by mouth every 6 (six) hours as  needed for severe pain. 03/17/16   Davonna Belling, MD  potassium chloride SA (K-DUR,KLOR-CON) 20 MEQ tablet Take 1 tablet (20 mEq total) by mouth daily. Patient not taking: Reported on 03/17/2016 03/16/14   Elmyra Ricks Pisciotta, PA-C   BP 120/77 mmHg  Pulse 88  Temp(Src) 99.4 F (37.4 C) (Oral)  Resp 18  Ht 5\' 8"  (1.727 m)  Wt 260 lb (117.935 kg)  BMI 39.54 kg/m2  SpO2 99% Physical Exam  Constitutional: She appears well-developed.  HENT:  Head: Atraumatic.  Eyes: EOM are normal.  Cardiovascular: Normal rate.   Pulmonary/Chest: Effort normal.  Abdominal: Soft. There is no tenderness.  Musculoskeletal: She exhibits tenderness. She exhibits no edema.  Strong radial pulse. Good capillary refill. Some form pain with flexion extension at the wrist. Good strength in hand. Sensation intact distally.  Neurological: She is alert.  Skin: Skin is warm.    ED Course  Procedures (including critical care time) Labs Review Labs Reviewed  CBC WITH DIFFERENTIAL/PLATELET - Abnormal; Notable for the following:    Neutro Abs 1.1 (*)    All other components within normal limits  BASIC METABOLIC PANEL - Abnormal; Notable for the following:    Calcium 8.4 (*)    All other components within normal limits    Imaging Review Dg Forearm Left  03/17/2016  CLINICAL DATA:  Left forearm and elbow pain.  No known injury. EXAM: LEFT FOREARM - 2 VIEW COMPARISON:  None. FINDINGS: There is no evidence of fracture or other focal bone lesions. Soft tissues are unremarkable. IMPRESSION: Negative. Electronically Signed   By: Fidela Salisbury M.D.   On: 03/17/2016 20:10   I have personally reviewed and evaluated these images and lab results as part of my medical decision-making.   EKG Interpretation None      MDM   Final diagnoses:  Pain of left forearm    Patient left forearm pain. No clear cause. Labs reassuring. X-ray reassuring. Will discharge home to follow-up with Doppler tomorrow.    Davonna Belling, MD 03/17/16 [phone]

## 2016-03-18 ENCOUNTER — Ambulatory Visit (HOSPITAL_COMMUNITY)
Admission: RE | Admit: 2016-03-18 | Discharge: 2016-03-18 | Disposition: A | Discharge status: Home / Self Care | Payer: Medicare Other | Source: Ambulatory Visit | Attending: Emergency Medicine | Admitting: Emergency Medicine

## 2016-03-18 DIAGNOSIS — M79632 Pain in left forearm: Secondary | ICD-10-CM | POA: Insufficient documentation

## 2016-03-18 DIAGNOSIS — M7989 Other specified soft tissue disorders: Secondary | ICD-10-CM | POA: Diagnosis not present

## 2016-03-18 NOTE — Progress Notes (Signed)
VASCULAR LAB PRELIMINARY  PRELIMINARY  PRELIMINARY  PRELIMINARY  Left upper extremity venous duplex completed.    Left Upper extremity:  No evidence of DVT, superficial thrombosis.  Harpreet Signore, RVT, RDMS 03/18/2016, 8:50 AM

## 2016-04-09 ENCOUNTER — Other Ambulatory Visit: Payer: Self-pay | Admitting: Internal Medicine

## 2016-04-09 DIAGNOSIS — M545 Low back pain: Principal | ICD-10-CM

## 2016-04-09 DIAGNOSIS — G8929 Other chronic pain: Secondary | ICD-10-CM

## 2016-04-25 ENCOUNTER — Ambulatory Visit
Admission: RE | Admit: 2016-04-25 | Discharge: 2016-04-25 | Disposition: A | Discharge status: Home / Self Care | Payer: Medicare Other | Source: Ambulatory Visit | Attending: Internal Medicine | Admitting: Internal Medicine

## 2016-04-25 ENCOUNTER — Other Ambulatory Visit: Payer: Self-pay | Admitting: Internal Medicine

## 2016-04-25 DIAGNOSIS — G8929 Other chronic pain: Secondary | ICD-10-CM

## 2016-04-25 DIAGNOSIS — M545 Low back pain: Principal | ICD-10-CM

## 2016-04-25 MED ORDER — IOPAMIDOL (ISOVUE-M 200) INJECTION 41%: 1.0000 mL | Freq: Once | INTRAMUSCULAR | Status: DC

## 2016-04-25 MED ORDER — IOPAMIDOL (ISOVUE-M 200) INJECTION 41%
1.0000 mL | Freq: Once | INTRAMUSCULAR | Status: AC
  Administered 2016-04-25: 1 mL via INTRA_ARTICULAR

## 2016-04-25 MED ORDER — METHYLPREDNISOLONE ACETATE 40 MG/ML INJ SUSP (RADIOLOG: 120.0000 mg | Freq: Once | INTRAMUSCULAR | Status: DC

## 2016-04-25 MED ORDER — METHYLPREDNISOLONE ACETATE 40 MG/ML INJ SUSP (RADIOLOG
120.0000 mg | Freq: Once | INTRAMUSCULAR | Status: AC
  Administered 2016-04-25: 120 mg via INTRA_ARTICULAR

## 2016-05-01 DIAGNOSIS — M47816 Spondylosis without myelopathy or radiculopathy, lumbar region: Secondary | ICD-10-CM | POA: Insufficient documentation

## 2016-05-01 DIAGNOSIS — Z6841 Body Mass Index (BMI) 40.0 and over, adult: Secondary | ICD-10-CM

## 2016-06-05 ENCOUNTER — Other Ambulatory Visit: Payer: Self-pay | Admitting: Internal Medicine

## 2016-06-05 DIAGNOSIS — Z1231 Encounter for screening mammogram for malignant neoplasm of breast: Secondary | ICD-10-CM

## 2016-07-17 ENCOUNTER — Ambulatory Visit
Admission: RE | Admit: 2016-07-17 | Discharge: 2016-07-17 | Disposition: A | Discharge status: Home / Self Care | Payer: Medicare Other | Source: Ambulatory Visit | Attending: Internal Medicine | Admitting: Internal Medicine

## 2016-07-17 DIAGNOSIS — Z1231 Encounter for screening mammogram for malignant neoplasm of breast: Secondary | ICD-10-CM

## 2016-08-14 ENCOUNTER — Other Ambulatory Visit: Payer: Self-pay | Admitting: Internal Medicine

## 2016-08-14 DIAGNOSIS — M47896 Other spondylosis, lumbar region: Secondary | ICD-10-CM

## 2016-08-22 ENCOUNTER — Ambulatory Visit
Admission: RE | Admit: 2016-08-22 | Discharge: 2016-08-22 | Disposition: A | Discharge status: Home / Self Care | Payer: Medicare Other | Source: Ambulatory Visit | Attending: Internal Medicine | Admitting: Internal Medicine

## 2016-08-22 DIAGNOSIS — M47896 Other spondylosis, lumbar region: Secondary | ICD-10-CM

## 2016-08-22 MED ORDER — METHYLPREDNISOLONE ACETATE 40 MG/ML INJ SUSP (RADIOLOG
120.0000 mg | Freq: Once | INTRAMUSCULAR | Status: AC
  Administered 2016-08-22: 120 mg via EPIDURAL

## 2016-08-22 MED ORDER — IOPAMIDOL (ISOVUE-M 200) INJECTION 41%
1.0000 mL | Freq: Once | INTRAMUSCULAR | Status: AC
  Administered 2016-08-22: 1 mL via EPIDURAL

## 2016-08-27 ENCOUNTER — Other Ambulatory Visit: Payer: Self-pay | Admitting: Internal Medicine

## 2016-08-27 DIAGNOSIS — M47896 Other spondylosis, lumbar region: Secondary | ICD-10-CM

## 2016-09-12 ENCOUNTER — Ambulatory Visit
Admission: RE | Admit: 2016-09-12 | Discharge: 2016-09-12 | Disposition: A | Discharge status: Home / Self Care | Payer: Medicare Other | Source: Ambulatory Visit | Attending: Internal Medicine | Admitting: Internal Medicine

## 2016-09-12 DIAGNOSIS — M47896 Other spondylosis, lumbar region: Secondary | ICD-10-CM

## 2016-09-12 MED ORDER — IOPAMIDOL (ISOVUE-M 200) INJECTION 41%
1.0000 mL | Freq: Once | INTRAMUSCULAR | Status: AC
  Administered 2016-09-12: 1 mL via EPIDURAL

## 2016-09-12 MED ORDER — METHYLPREDNISOLONE ACETATE 40 MG/ML INJ SUSP (RADIOLOG
120.0000 mg | Freq: Once | INTRAMUSCULAR | Status: AC
  Administered 2016-09-12: 120 mg via EPIDURAL

## 2016-09-12 NOTE — Discharge Instructions (Signed)

## 2017-01-22 ENCOUNTER — Other Ambulatory Visit: Payer: Self-pay | Admitting: Internal Medicine

## 2017-01-22 DIAGNOSIS — G8929 Other chronic pain: Secondary | ICD-10-CM

## 2017-01-22 DIAGNOSIS — M545 Low back pain: Principal | ICD-10-CM

## 2017-01-30 ENCOUNTER — Ambulatory Visit
Admission: RE | Admit: 2017-01-30 | Discharge: 2017-01-30 | Disposition: A | Discharge status: Home / Self Care | Payer: Medicare Other | Source: Ambulatory Visit | Attending: Internal Medicine | Admitting: Internal Medicine

## 2017-01-30 DIAGNOSIS — G8929 Other chronic pain: Secondary | ICD-10-CM

## 2017-01-30 DIAGNOSIS — M545 Low back pain: Principal | ICD-10-CM

## 2017-01-30 MED ORDER — IOPAMIDOL (ISOVUE-M 200) INJECTION 41%
1.0000 mL | Freq: Once | INTRAMUSCULAR | Status: AC
  Administered 2017-01-30: 1 mL via EPIDURAL

## 2017-01-30 MED ORDER — METHYLPREDNISOLONE ACETATE 40 MG/ML INJ SUSP (RADIOLOG
120.0000 mg | Freq: Once | INTRAMUSCULAR | Status: AC
  Administered 2017-01-30: 120 mg via EPIDURAL

## 2017-06-16 ENCOUNTER — Encounter: Payer: Self-pay | Admitting: Physical Medicine & Rehabilitation

## 2017-07-10 ENCOUNTER — Ambulatory Visit (HOSPITAL_BASED_OUTPATIENT_CLINIC_OR_DEPARTMENT_OTHER): Payer: Medicare Other | Admitting: Physical Medicine & Rehabilitation

## 2017-07-10 ENCOUNTER — Encounter: Payer: Self-pay | Admitting: Physical Medicine & Rehabilitation

## 2017-07-10 ENCOUNTER — Encounter: Payer: Medicare Other | Attending: Physical Medicine & Rehabilitation

## 2017-07-10 VITALS — BP 126/81 | HR 89

## 2017-07-10 DIAGNOSIS — K219 Gastro-esophageal reflux disease without esophagitis: Secondary | ICD-10-CM | POA: Diagnosis not present

## 2017-07-10 DIAGNOSIS — M47816 Spondylosis without myelopathy or radiculopathy, lumbar region: Secondary | ICD-10-CM | POA: Insufficient documentation

## 2017-07-10 DIAGNOSIS — I10 Essential (primary) hypertension: Secondary | ICD-10-CM | POA: Insufficient documentation

## 2017-07-10 DIAGNOSIS — I2510 Atherosclerotic heart disease of native coronary artery without angina pectoris: Secondary | ICD-10-CM | POA: Diagnosis not present

## 2017-07-10 DIAGNOSIS — M4310 Spondylolisthesis, site unspecified: Secondary | ICD-10-CM

## 2017-07-10 DIAGNOSIS — F323 Major depressive disorder, single episode, severe with psychotic features: Secondary | ICD-10-CM | POA: Diagnosis not present

## 2017-07-10 DIAGNOSIS — M47896 Other spondylosis, lumbar region: Secondary | ICD-10-CM | POA: Diagnosis not present

## 2017-07-10 DIAGNOSIS — M545 Low back pain: Secondary | ICD-10-CM | POA: Diagnosis not present

## 2017-07-10 DIAGNOSIS — G8929 Other chronic pain: Secondary | ICD-10-CM | POA: Diagnosis present

## 2017-07-10 NOTE — Patient Instructions (Signed)
Plan on lumbar medial branch blocks, as discussed, it is difficult to predict the duration of the response to these injections. There is a longer lasting procedure that we can do if needed  called radiofrequency neurotomy

## 2017-07-10 NOTE — Progress Notes (Signed)
Subjective:    Patient ID: [name], female    DOB: 1961-07-21, 56 y.o.   MRN: [id number]  HPI Chief complaint is low back pain, mainly right-sided, with intermittent radiation of pain to the right foot.  Patient has increased pain with standing, pain is partially relieved by sitting, but when she sits too long. She also gets some back pain as well. Pain shooting down to the right leg occurs with walking. It does not depend on the distance she has walked.  Patient also has numbness and tingling in both feet, which comes and goes, worse when she sits on the toilet.  Patient has had lumbar injections in the past mainly epidurals at right L5-S1 interlaminar approach, as well as left L4-5. The patient used to get a prolonged relief with epidural injections, but now they are not lasting as long. Patient gets partial relief for 30-45 days.  Patient has had physical therapy back in 2013 and she did not feel it was helpful.  Patient also has a TENS unit which is helpful but she has had some problems with skin irritation from the pads.  Patient has tried tramadol, oxycodone and hydrocodone but these cause nausea and she is not able to tolerate them.  Has problems getting her socks on   Pain Inventory Average Pain 8 Pain Right Now 8 My pain is sharp, burning, dull, tingling and aching  In the last 24 hours, has pain interfered with the following? General activity 9 Relation with others 9 Enjoyment of life 10 What TIME of day is your pain at its worst? all Sleep (in general) Poor  Pain is worse with: walking, bending, sitting and standing Pain improves with: rest, TENS and injections Relief from Meds: 3  Mobility walk without assistance do you drive?  yes  Function disabled: date disabled 2010  Neuro/Psych numbness tremor tingling trouble walking spasms dizziness depression anxiety  Prior Studies Any changes since last visit?  no TECHNIQUE: Multiplanar,  multisequence MR imaging of the lumbar spine was performed. No intravenous contrast was administered.   COMPARISON: 06/27/2009   FINDINGS: The lowest lumbar type non-rib-bearing vertebra is labeled as L5. The conus medullaris appears normal. Conus level: L1.   Intervertebral disc desiccation is observed at L5-S1. 3 mm degenerative retrolisthesis at L5-S1.   No significant vertebral marrow edema is identified. Additional findings at individual levels are as follows:   L1-2: Unremarkable.   L2-3: Unremarkable.   L3-4: No impingement. Mild disc bulge.   L4-5: Borderline left foraminal stenosis due to facet arthropathy. Mild disc bulge. No appreciable change from prior.   L5-S1: Borderline left foraminal stenosis due to intervertebral spurring, mild disc bulge, and conjoined left L5 and S1 nerve roots. Small central disc protrusion.   IMPRESSION: 1. Borderline left foraminal impingement at the L4-5 and L5-S1 levels due to spondylosis and degenerative disc disease. There is also an appearance of conjoined left L5 and S1 nerve roots.     Electronically Signed By: Van Clines M.D. On: 12/11/2015 09:24 Technically successful right S1 nerve root block and transforaminal epidural.   I note that the patient has not had MR imaging since 2010. Since she has been receiving multiple injections over that time and her symptoms are progressive, I wonder if repeat MRI might be useful in guiding therapy.     Electronically Signed   By: Nelson Chimes M.D.   On: 09/30/2013 14:58 Technically successful third lumbar interlaminar epidural injection at right L5-S1.  SIGNATURE   Electronically Signed   By: Dereck Ligas M.D.   On: 01/05/2014 08:02 Technically successful first lumbar interlaminar epidural injection at L5-S1, RIGHT.     Electronically Signed   By: Rolla Flatten M.D.   On: 10/12/2014 08:53 IMPRESSION: Technically successful second lumbar interlaminar  epidural injection at RIGHT L5-S1.     Electronically Signed   By: Dereck Ligas M.D.   On: 11/07/2014 08:06 IMPRESSION: Technically successful lumbar interlaminar epidural injection at LEFT L4-L5.     Electronically Signed   By: Dereck Ligas M.D.   On: 01/29/2015 09:53 Technically successful first lumbar interlaminar epidural injection at L4-5, LEFT.     Electronically Signed   By: Staci Righter M.D.   On: 01/02/2016 10:09 Technically successful repeat epidural injection on the right at L5-S1.     Electronically Signed   By: Nelson Chimes M.D.   On: 01/21/2016 08:37  Physicians involved in your care Any changes since last visit?  no   Family History  Problem Relation Age of Onset  . Hypertension Mother    Social History   Social History  . Marital status: Single    Spouse name: N/A  . Number of children: N/A  . Years of education: N/A   Social History Main Topics  . Smoking status: Never Smoker  . Smokeless tobacco: Never Used  . Alcohol use No  . Drug use: No  . Sexual activity: Not on file   Other Topics Concern  . Not on file   Social History Narrative  . No narrative on file   Past Surgical History:  Procedure Laterality Date  . BILATERAL CARPAL TUNNEL RELEASE  2006  &  2009  . CARDIAC CATHETERIZATION  01-20-2008   DR Marlou Porch   MILD CAD/ RCA WITH UP TO 50% PROXIMAL /  DIFFUSE DISTAL IRREGULARITIES UP TO 30%/ NORMAL LVEF  . SHOULDER SURGERY     Left  . TONSILLECTOMY AND ADENOIDECTOMY  AS CHILD   AND PALETTE REPAIR  . TOTAL ABDOMINAL HYSTERECTOMY W/ BILATERAL SALPINGOOPHORECTOMY  2001   Past Medical History:  Diagnosis Date  . Coronary artery disease CARDIOLOGIST-- DR Marlou Porch--  WILL REQUEST NOTE   MILD NON-OBSTRUCTIVE CAD  . GERD (gastroesophageal reflux disease)   . H/O hiatal hernia   . Hypertension   . Impingement syndrome of left shoulder   . Severe major depression with psychotic features ([place])    There were no vitals taken  for this visit.  Opioid Risk Score:   Fall Risk Score:  `1  Depression screen PHQ 2/9  No flowsheet data found.   Review of Systems  Constitutional: Negative.   HENT: Negative.   Eyes: Negative.   Respiratory: Negative.   Cardiovascular: Negative.   Gastrointestinal: Positive for constipation.  Endocrine: Negative.   Genitourinary: Negative.   Musculoskeletal: Positive for gait problem.  Skin: Negative.   Allergic/Immunologic: Negative.   Hematological: Negative.   Psychiatric/Behavioral: Negative.   All other systems reviewed and are negative.      Objective:   Physical Exam  Constitutional: She appears well-developed and well-nourished.  HENT:  Head: Normocephalic and atraumatic.  Eyes: Pupils are equal, round, and reactive to light. Conjunctivae and EOM are normal.  Cardiovascular: Normal rate, regular rhythm and normal heart sounds.   Pulmonary/Chest: Effort normal and breath sounds normal. No respiratory distress. She has no wheezes.  Abdominal: Soft. Bowel sounds are normal. She exhibits no distension. There is no tenderness.  Musculoskeletal:  Right hip: Normal.       Left hip: She exhibits decreased range of motion.       Lumbar back: She exhibits decreased range of motion and tenderness. She exhibits no deformity and no spasm.  Lumbar range of motion is with reduced flexion, extension, lateral bending and rotation. Both flexion and extension are painful, her flexion is approximately 50% of normal. Extension is less than 25% of normal.  Neurological:  Reflex Scores:      Tricep reflexes are 2+ on the right side and 2+ on the left side.      Bicep reflexes are 2+ on the right side and 2+ on the left side.      Brachioradialis reflexes are 2+ on the right side and 2+ on the left side.      Patellar reflexes are 1+ on the right side and 1+ on the left side.      Achilles reflexes are 1+ on the right side and 1+ on the left side. Reduced pinprick sensation  bilateral C5 as well as right C6 and right C7  Psychiatric: Her speech is normal and behavior is normal. Judgment and thought content normal. Her affect is blunt. Cognition and memory are normal.  Nursing note and vitals reviewed.  Cervical range of motion is 100% flexion, extension 100%. Rotation to right, 75%. Rotation to the left Motor strength is 5/5 bilateral deltoid, biceps, triceps, grip, hip flexor, knee extensor, ankle dorsiflexor. Sensation to pinprick in the lower limbs is normal. At L3, L4, L5, S1  Reduced range of motion for left hip external rotation     Assessment & Plan:  1. Chronic low back pain with right lower extremity sciatic symptoms. Her primary complaint is her axial back pain. This is likely a combination of Lumbar spondylosis.Patient has lumbar spondylolisthesis , which is likely degenerative from facet arthropathy at L5-S1 Lumbar degenerative disc  She would be a good candidate for lumbar medial branch blocks under fluoroscopic guidance with target L3, L4 medial branches bilaterally as well as L5 dorsal ramus bilaterally.  Patient has intermittent radicular symptoms as well, but this is not her primary complaint. She is on gabapentin for this

## 2017-07-14 ENCOUNTER — Other Ambulatory Visit: Payer: Self-pay | Admitting: Internal Medicine

## 2017-07-14 DIAGNOSIS — Z1231 Encounter for screening mammogram for malignant neoplasm of breast: Secondary | ICD-10-CM

## 2017-07-24 ENCOUNTER — Ambulatory Visit
Admission: RE | Admit: 2017-07-24 | Discharge: 2017-07-24 | Disposition: A | Discharge status: Home / Self Care | Payer: Medicare Other | Source: Ambulatory Visit | Attending: Internal Medicine | Admitting: Internal Medicine

## 2017-07-24 ENCOUNTER — Ambulatory Visit: Payer: Medicare Other

## 2017-07-24 DIAGNOSIS — Z1231 Encounter for screening mammogram for malignant neoplasm of breast: Secondary | ICD-10-CM

## 2017-07-31 ENCOUNTER — Ambulatory Visit (HOSPITAL_BASED_OUTPATIENT_CLINIC_OR_DEPARTMENT_OTHER): Payer: Medicare Other | Admitting: Physical Medicine & Rehabilitation

## 2017-07-31 ENCOUNTER — Encounter: Payer: Self-pay | Admitting: Physical Medicine & Rehabilitation

## 2017-07-31 ENCOUNTER — Encounter: Payer: Medicare Other | Attending: Physical Medicine & Rehabilitation

## 2017-07-31 VITALS — BP 127/83 | HR 68

## 2017-07-31 DIAGNOSIS — K219 Gastro-esophageal reflux disease without esophagitis: Secondary | ICD-10-CM | POA: Diagnosis not present

## 2017-07-31 DIAGNOSIS — M47896 Other spondylosis, lumbar region: Secondary | ICD-10-CM | POA: Insufficient documentation

## 2017-07-31 DIAGNOSIS — F323 Major depressive disorder, single episode, severe with psychotic features: Secondary | ICD-10-CM | POA: Insufficient documentation

## 2017-07-31 DIAGNOSIS — G8929 Other chronic pain: Secondary | ICD-10-CM | POA: Insufficient documentation

## 2017-07-31 DIAGNOSIS — M545 Low back pain: Secondary | ICD-10-CM | POA: Insufficient documentation

## 2017-07-31 DIAGNOSIS — I10 Essential (primary) hypertension: Secondary | ICD-10-CM | POA: Insufficient documentation

## 2017-07-31 DIAGNOSIS — M47816 Spondylosis without myelopathy or radiculopathy, lumbar region: Secondary | ICD-10-CM

## 2017-07-31 DIAGNOSIS — I2510 Atherosclerotic heart disease of native coronary artery without angina pectoris: Secondary | ICD-10-CM | POA: Diagnosis not present

## 2017-07-31 NOTE — Progress Notes (Signed)
  [facility]   Name: [name] DOB:04/03/1961 MRN: [id number]  Date:07/31/2017  Physician: Alysia Penna, MD    Nurse/CMA: Bright CMA  Allergies:  Allergies  Allergen Reactions  . Adhesive [Tape] Other (See Comments)    Irritation and tears skin    Consent Signed: Yes.    Is patient diabetic? No.  CBG today? NA  Pregnant: No. LMP: No LMP recorded. Patient has had a hysterectomy. (age 55-55)  Anticoagulants: no Anti-inflammatory: no Antibiotics: no  Procedure: medial branch block bilateral l3-5 Position: Prone   Start Time: 1045am End Time: 1100AM Fluoro Time:69S  RN/CMA Bright CMA Bright CMA    Time 1021 1102AM    BP 127/83 128/68    Pulse 68 67    Respirations 16 16    O2 Sat 97 97    S/S 6 6    Pain Level 8/10 5/10     D/C home with [name], patient A & O X 3, D/C instructions reviewed, and sits independently.

## 2017-07-31 NOTE — Progress Notes (Signed)

## 2017-07-31 NOTE — Patient Instructions (Signed)

## 2017-08-03 ENCOUNTER — Ambulatory Visit: Payer: Medicare Other | Admitting: Physical Medicine & Rehabilitation

## 2017-08-07 ENCOUNTER — Ambulatory Visit: Payer: Medicare Other | Admitting: Physical Medicine & Rehabilitation

## 2017-08-23 ENCOUNTER — Encounter (HOSPITAL_COMMUNITY): Payer: Self-pay | Admitting: Emergency Medicine

## 2017-08-23 ENCOUNTER — Emergency Department (HOSPITAL_COMMUNITY)
Admission: EM | Admit: 2017-08-23 | Discharge: 2017-08-23 | Disposition: A | Discharge status: Home / Self Care | Payer: Medicare Other | Attending: Emergency Medicine | Admitting: Emergency Medicine

## 2017-08-23 ENCOUNTER — Emergency Department (HOSPITAL_COMMUNITY): Payer: Medicare Other

## 2017-08-23 DIAGNOSIS — Z7982 Long term (current) use of aspirin: Secondary | ICD-10-CM | POA: Diagnosis not present

## 2017-08-23 DIAGNOSIS — I10 Essential (primary) hypertension: Secondary | ICD-10-CM | POA: Insufficient documentation

## 2017-08-23 DIAGNOSIS — G4489 Other headache syndrome: Secondary | ICD-10-CM

## 2017-08-23 DIAGNOSIS — Z79899 Other long term (current) drug therapy: Secondary | ICD-10-CM | POA: Diagnosis not present

## 2017-08-23 DIAGNOSIS — I2510 Atherosclerotic heart disease of native coronary artery without angina pectoris: Secondary | ICD-10-CM | POA: Diagnosis not present

## 2017-08-23 DIAGNOSIS — R51 Headache: Secondary | ICD-10-CM | POA: Diagnosis present

## 2017-08-23 MED ORDER — MORPHINE SULFATE (PF) 4 MG/ML IV SOLN
6.0000 mg | Freq: Once | INTRAVENOUS | Status: AC
  Administered 2017-08-23: 6 mg via INTRAMUSCULAR
  Filled 2017-08-23: qty 2

## 2017-08-23 MED ORDER — DIPHENHYDRAMINE HCL 50 MG/ML IJ SOLN
25.0000 mg | Freq: Once | INTRAMUSCULAR | Status: AC
  Administered 2017-08-23: 25 mg via INTRAMUSCULAR
  Filled 2017-08-23: qty 1

## 2017-08-23 MED ORDER — BUTALBITAL-APAP-CAFFEINE 50-325-40 MG PO TABS: 1.0000 | ORAL_TABLET | Freq: Four times a day (QID) | ORAL | 0 refills | Status: DC | PRN

## 2017-08-23 MED ORDER — METOCLOPRAMIDE HCL 5 MG/ML IJ SOLN
10.0000 mg | Freq: Once | INTRAMUSCULAR | Status: AC
  Administered 2017-08-23: 10 mg via INTRAMUSCULAR
  Filled 2017-08-23: qty 2

## 2017-08-23 NOTE — ED Provider Notes (Signed)
[facility] DEPT Provider Note   CSN: [id number] Arrival date & time: 08/23/17  5638     History   Chief Complaint Chief Complaint  Patient presents with  . Headache    HPI [name] is a 56 y.o. female.  56 year old female presents complaining of right-sided headache which began on Friday.  Headache is waxed and waned and characterizes a throbbing sensation with intermittent sharp shooting pain.  Is now migrated to her right forehead and has not been associated with fever, visual changes, eye pain.  Denies any ataxia.  No upper or lower extremity weakness.  Has had some photophobia but no mental status changes or neck discomfort.  No rashes.  Has used over-the-counter medications without relief.  No prior history of same.      Past Medical History:  Diagnosis Date  . Coronary artery disease CARDIOLOGIST-- DR Marlou Porch--  WILL REQUEST NOTE   MILD NON-OBSTRUCTIVE CAD  . GERD (gastroesophageal reflux disease)   . H/O hiatal hernia   . Hypertension   . Impingement syndrome of left shoulder   . Severe major depression with psychotic features [facility])     Patient Active Problem List   Diagnosis Date Noted  . Degenerative spondylolisthesis 07/10/2017  . Spondylosis without myelopathy or radiculopathy, lumbar region 07/10/2017  . Morbid obesity with BMI of 40.0-44.9, adult ([place]) 05/01/2016  . Osteoarthritis of lumbar spine 05/01/2016  . Disturbance in sleep behavior 12/20/2015  . Atherosclerosis of native coronary artery of transplanted heart 12/20/2015  . Cardiac chest pain 12/20/2015  . Encounter for screening for malignant neoplasm of colon 12/03/2015  . Impingement syndrome of shoulder 12/03/2015  . HLD (hyperlipidemia) 12/03/2015  . History of abdominal hernia 12/03/2015  . Clinical depression 12/03/2015  . Arteriosclerosis of coronary artery 12/03/2015  . Allergic rhinitis 12/03/2015  . Hypertension   . GERD (gastroesophageal reflux  disease)   . H/O hiatal hernia   . Severe major depression with psychotic features ([place])   . Impingement syndrome of left shoulder   . Coronary artery disease     Past Surgical History:  Procedure Laterality Date  . BILATERAL CARPAL TUNNEL RELEASE  2006  &  2009  . CARDIAC CATHETERIZATION  01-20-2008   DR Marlou Porch   MILD CAD/ RCA WITH UP TO 50% PROXIMAL /  DIFFUSE DISTAL IRREGULARITIES UP TO 30%/ NORMAL LVEF  . SHOULDER SURGERY     Left  . TONSILLECTOMY AND ADENOIDECTOMY  AS CHILD   AND PALETTE REPAIR  . TOTAL ABDOMINAL HYSTERECTOMY W/ BILATERAL SALPINGOOPHORECTOMY  2001    OB History    No data available       Home Medications    Prior to Admission medications   Medication Sig Start Date End Date Taking? Authorizing Provider  amLODipine (NORVASC) 5 MG tablet Take 2 tablets (10 mg total) by mouth every morning. Patient taking differently: Take 5 mg by mouth every morning.  11/03/12  Yes Leonard Schwartz, MD  aspirin EC 81 MG tablet Take 81 mg by mouth every evening.    Yes [provider]  atorvastatin (LIPITOR) 80 MG tablet Take 80 mg by mouth every evening. 12/25/15  Yes [provider]  buPROPion (WELLBUTRIN XL) 150 MG 24 hr tablet Take 150-300 mg by mouth 2 (two) times daily. Takes x2 tabs in am and x1 tab in pm   Yes [provider]  cyclobenzaprine (FLEXERIL) 10 MG tablet Take 10 mg by mouth 2 (two) times daily as needed.  Muscle spasms 03/03/16  Yes [provider]  gabapentin (NEURONTIN) 100 MG capsule Take 300 mg by mouth 2 (two) times daily.   Yes [provider]  hydrOXYzine (VISTARIL) 50 MG capsule Take 50 mg by mouth 2 (two) times daily as needed for anxiety.   Yes [provider]  omeprazole (PRILOSEC) 40 MG capsule Take 40 mg by mouth daily.    Yes [provider]  perphenazine (TRILAFON) 4 MG tablet Take 2-6 mg by mouth 2 (two) times daily. 0.5 tab in am, 1.5 tab in pm   Yes [provider]    sertraline (ZOLOFT) 100 MG tablet Take 200 mg by mouth daily.   Yes [provider]  sucralfate (CARAFATE) 1 g tablet Take 1 g by mouth 4 (four) times daily as needed (acid reflux).    Yes [provider]    Family History Family History  Problem Relation Age of Onset  . Hypertension Mother     Social History Social History  Substance Use Topics  . Smoking status: Never Smoker  . Smokeless tobacco: Never Used  . Alcohol use No     Allergies   Adhesive [tape]   Review of Systems Review of Systems  All other systems reviewed and are negative.    Physical Exam Updated Vital Signs BP (!) 143/65 (BP Location: Left Arm)   Pulse 72   Temp 98.4 F (36.9 C) (Oral)   Resp 18   Ht 1.727 m (5\' 8" )   Wt 117.9 kg (260 lb)   SpO2 99%   BMI 39.53 kg/m   Physical Exam  Constitutional: She is oriented to person, place, and time. She appears well-developed and well-nourished.  Non-toxic appearance. No distress.  HENT:  Head: Normocephalic and atraumatic.  Eyes: Pupils are equal, round, and reactive to light. Conjunctivae, EOM and lids are normal.  Neck: Normal range of motion. Neck supple. No tracheal deviation present. No thyroid mass present.  Cardiovascular: Normal rate, regular rhythm and normal heart sounds.  Exam reveals no gallop.   No murmur heard. Pulmonary/Chest: Effort normal and breath sounds normal. No stridor. No respiratory distress. She has no decreased breath sounds. She has no wheezes. She has no rhonchi. She has no rales.  Abdominal: Soft. Normal appearance and bowel sounds are normal. She exhibits no distension. There is no tenderness. There is no rebound and no CVA tenderness.  Musculoskeletal: Normal range of motion. She exhibits no edema or tenderness.  Neurological: She is alert and oriented to person, place, and time. She has normal strength. No cranial nerve deficit or sensory deficit. GCS eye subscore is 4. GCS verbal subscore is 5. GCS  motor subscore is 6.  Skin: Skin is warm and dry. No abrasion and no rash noted.  Psychiatric: She has a normal mood and affect. Her speech is normal and behavior is normal.  Nursing note and vitals reviewed.    ED Treatments / Results  Labs (all labs ordered are listed, but only abnormal results are displayed) Labs Reviewed - No data to display  EKG  EKG Interpretation None       Radiology No results found.  Procedures Procedures (including critical care time)  Medications Ordered in ED Medications  metoCLOPramide (REGLAN) injection 10 mg (not administered)  diphenhydrAMINE (BENADRYL) injection 25 mg (not administered)     Initial Impression / Assessment and Plan / ED Course  I have reviewed the triage vital signs and the nursing notes.  Pertinent labs &  imaging results that were available during my care of the patient were reviewed by me and considered in my medical decision making (see chart for details).     Head CT negative.  Patient's pain treated and she feels better.  Suspect this was a migraine and no red flags for subarachnoid hemorrhage or giant cell arteritis.  She has no visual complaints.  Do not think this is,.  Patient stable for discharge with return precautions  Final Clinical Impressions(s) / ED Diagnoses   Final diagnoses:  None    New Prescriptions New Prescriptions   No medications on file     Lacretia Leigh, MD 08/23/17 1350

## 2017-08-23 NOTE — ED Triage Notes (Signed)
Patient reports headache starting on Friday night then when woke up yesterday she had headache just on right lateral side of head that radiate down to neck. Patient denies blurred vision but reports light sensitivity.

## 2017-08-28 ENCOUNTER — Ambulatory Visit (HOSPITAL_BASED_OUTPATIENT_CLINIC_OR_DEPARTMENT_OTHER): Payer: Medicare Other | Admitting: Physical Medicine & Rehabilitation

## 2017-08-28 ENCOUNTER — Encounter: Payer: Self-pay | Admitting: Physical Medicine & Rehabilitation

## 2017-08-28 ENCOUNTER — Encounter: Payer: Medicare Other | Attending: Physical Medicine & Rehabilitation

## 2017-08-28 VITALS — BP 122/82 | HR 87

## 2017-08-28 DIAGNOSIS — G8929 Other chronic pain: Secondary | ICD-10-CM | POA: Diagnosis present

## 2017-08-28 DIAGNOSIS — K219 Gastro-esophageal reflux disease without esophagitis: Secondary | ICD-10-CM | POA: Diagnosis not present

## 2017-08-28 DIAGNOSIS — M47816 Spondylosis without myelopathy or radiculopathy, lumbar region: Secondary | ICD-10-CM | POA: Diagnosis not present

## 2017-08-28 DIAGNOSIS — M47896 Other spondylosis, lumbar region: Secondary | ICD-10-CM | POA: Diagnosis not present

## 2017-08-28 DIAGNOSIS — I10 Essential (primary) hypertension: Secondary | ICD-10-CM | POA: Insufficient documentation

## 2017-08-28 DIAGNOSIS — I2510 Atherosclerotic heart disease of native coronary artery without angina pectoris: Secondary | ICD-10-CM | POA: Diagnosis not present

## 2017-08-28 DIAGNOSIS — M545 Low back pain: Secondary | ICD-10-CM | POA: Insufficient documentation

## 2017-08-28 DIAGNOSIS — F323 Major depressive disorder, single episode, severe with psychotic features: Secondary | ICD-10-CM | POA: Insufficient documentation

## 2017-08-28 NOTE — Progress Notes (Signed)
  PROCEDURE RECORD [facility]   Name: [name] DOB:1961/03/21 MRN: [id number]  Date:08/28/2017  Physician: Alysia Penna, MD    Nurse/CMA: Wessling CMA / Alvira Hecht CMA  Allergies:  Allergies  Allergen Reactions  . Adhesive [Tape] Other (See Comments)    Irritation and tears skin    Consent Signed: Yes.    Is patient diabetic? No.  CBG today? NA  Pregnant: No. LMP: No LMP recorded. Patient has had a hysterectomy. (age 55-55)  Anticoagulants: no Anti-inflammatory: no Antibiotics: no  Procedure: B L3-5 Mbb Position: Prone   Start Time: 1:43pm  End Time: 1:56pm  Fluoro Time: 56  RN/CMA Kasyn Stouffer CMA Wessling, CMA    Time 114pm     BP 122/82 131/83    Pulse 87 87    Respirations 16 16    O2 Sat 97 96    S/S 6 6    Pain Level 8/10 0/10     D/C home with [name], patient A & O X 3, D/C instructions reviewed, and sits independently.

## 2017-08-28 NOTE — Patient Instructions (Signed)

## 2017-08-28 NOTE — Progress Notes (Signed)

## 2017-09-25 ENCOUNTER — Ambulatory Visit (HOSPITAL_BASED_OUTPATIENT_CLINIC_OR_DEPARTMENT_OTHER): Payer: Medicare Other | Admitting: Physical Medicine & Rehabilitation

## 2017-09-25 VITALS — BP 123/84 | HR 77 | Resp 14

## 2017-09-25 DIAGNOSIS — M47816 Spondylosis without myelopathy or radiculopathy, lumbar region: Secondary | ICD-10-CM | POA: Diagnosis not present

## 2017-09-25 DIAGNOSIS — G8929 Other chronic pain: Secondary | ICD-10-CM | POA: Diagnosis not present

## 2017-09-25 NOTE — Progress Notes (Signed)
  PROCEDURE RECORD [facility]   Name: HAVA MASSINGALE DOB:1961-05-20 MRN: [id number]  Date:09/25/2017  Physician: Alysia Penna, MD    Nurse/CMA: Lexys Milliner, CMA    Allergies:  Allergies  Allergen Reactions  . Adhesive [Tape] Other (See Comments)    Irritation and tears skin    Consent Signed: Yes.    Is patient diabetic? No.  CBG today?   Pregnant: No. LMP: No LMP recorded. Patient has had a hysterectomy. (age 55-55)  Anticoagulants: no Anti-inflammatory: no Antibiotics: no  Procedure: left radiofrequency neurotomy  Position: Prone Start Time:  2:32pm End Time:  2:58pm Fluoro Time: 1:17  RN/CMA Travis Mastel, CMA Lorina Duffner, CMA    Time 2:00pm 3:00pm    BP 123/84 140/85    Pulse 77 82    Respirations 14 14    O2 Sat 96 97    S/S 6 6    Pain Level 7/10 4/10     D/C home with sister, patient A & O X 3, D/C instructions reviewed, and sits independently.

## 2017-09-25 NOTE — Patient Instructions (Signed)

## 2017-09-25 NOTE — Progress Notes (Signed)
Left L5 dorsal ramus., left L4 and left L3 medial branch radio frequency neurotomy under fluoroscopic guidance  Indication: Low back pain due to lumbar spondylosis which has been relieved on 2 occasions by greater than 50% by lumbar medial branch blocks at corresponding levels.  Informed consent was obtained after describing risks and benefits of the procedure with the patient, this includes bleeding, bruising, infection, paralysis and medication side effects. The patient wishes to proceed and has given written consent. The patient was placed in a prone position. The lumbar and sacral area was marked and prepped with Betadine. A 25-gauge 1-1/2 inch needle was inserted into the skin and subcutaneous tissue at 3 sites in one ML of 1% lidocaine was injected into each site. Then a 18-gauge 15 cm radio frequency needle with a 1 cm curved active tip was inserted targeting the left S1 SAP/sacral ala junction. Bone contact was made and confirmed with lateral imaging.  motor stimulation at 2 Hz confirm proper needle location followed by injection of one ML of 1% MPF lidocaine. Then the left L5 SAP/transverse process junction was targeted. Bone contact was made and confirmed with lateral imaging motor stimulation at 2 Hz confirm proper needle location followed by injection of one ML of the solution containing one ML of  1% MPF lidocaine. Then the left L4 SAP/transverse process junction was targeted. Bone contact was made and confirmed with lateral imaging. motor stimulation at 2 Hz confirm proper needle location followed by injection of one ML of the solution containing one ML of1% MPF lidocaine. Radio frequency lesion  at 80C for 90 seconds was performed. Needles were removed. Post procedure instructions and vital signs were performed. Patient tolerated procedure well. Followup appointment was given.  

## 2017-09-30 ENCOUNTER — Emergency Department (HOSPITAL_BASED_OUTPATIENT_CLINIC_OR_DEPARTMENT_OTHER): Admit: 2017-09-30 | Discharge: 2017-09-30 | Disposition: A | Discharge status: Home / Self Care | Payer: Medicare Other

## 2017-09-30 ENCOUNTER — Emergency Department (HOSPITAL_COMMUNITY)
Admission: EM | Admit: 2017-09-30 | Discharge: 2017-09-30 | Disposition: A | Discharge status: Home / Self Care | Payer: Medicare Other | Attending: Emergency Medicine | Admitting: Emergency Medicine

## 2017-09-30 ENCOUNTER — Other Ambulatory Visit: Payer: Self-pay

## 2017-09-30 ENCOUNTER — Encounter (HOSPITAL_COMMUNITY): Payer: Self-pay | Admitting: Emergency Medicine

## 2017-09-30 DIAGNOSIS — M25561 Pain in right knee: Secondary | ICD-10-CM | POA: Insufficient documentation

## 2017-09-30 DIAGNOSIS — Z7982 Long term (current) use of aspirin: Secondary | ICD-10-CM | POA: Diagnosis not present

## 2017-09-30 DIAGNOSIS — M79609 Pain in unspecified limb: Secondary | ICD-10-CM | POA: Diagnosis not present

## 2017-09-30 DIAGNOSIS — I10 Essential (primary) hypertension: Secondary | ICD-10-CM | POA: Diagnosis not present

## 2017-09-30 DIAGNOSIS — Z79899 Other long term (current) drug therapy: Secondary | ICD-10-CM | POA: Diagnosis not present

## 2017-09-30 DIAGNOSIS — I2510 Atherosclerotic heart disease of native coronary artery without angina pectoris: Secondary | ICD-10-CM | POA: Diagnosis not present

## 2017-09-30 HISTORY — DX: Pain in unspecified knee: M25.569

## 2017-09-30 MED ORDER — PREDNISONE 10 MG (21) PO TBPK: ORAL_TABLET | ORAL | 0 refills | Status: DC

## 2017-09-30 NOTE — Discharge Instructions (Signed)
Follow up with your orthopedic doctor. Wear the knee brace for comfort.

## 2017-09-30 NOTE — Progress Notes (Signed)
RLE venous duplex prelim: negative for DVT.  Herbert Marken Eunice, RDMS, RVT  

## 2017-09-30 NOTE — ED Triage Notes (Signed)
Pt reports recurrent r/knee pain with slight swelling. Denies trauma

## 2017-09-30 NOTE — ED Provider Notes (Signed)
[place] DEPT Provider Note   CSN: [id number] Arrival date & time: 09/30/17  1134     History   Chief Complaint Chief Complaint  Patient presents with  . Knee Pain  . Joint Swelling    HPI [name] is a 56 y.o. female who presents to the ED with right knee pain and swelling. Patient reports she had been having some problems with her knees and saw her orthopedic doctor and had a cortisone injection in her left hip and it took care of the left hip and left knee pain. Over the past few days she reports pain to the posterior aspect of the right knee and thigh. She also c/o a popping in the knee.   HPI  Past Medical History:  Diagnosis Date  . Coronary artery disease CARDIOLOGIST-- DR Marlou Porch--  WILL REQUEST NOTE   MILD NON-OBSTRUCTIVE CAD  . GERD (gastroesophageal reflux disease)   . H/O hiatal hernia   . Hypertension   . Impingement syndrome of left shoulder   . Knee pain    r/knee  . Severe major depression with psychotic features [facility])     Patient Active Problem List   Diagnosis Date Noted  . Degenerative spondylolisthesis 07/10/2017  . Spondylosis without myelopathy or radiculopathy, lumbar region 07/10/2017  . Morbid obesity with BMI of 40.0-44.9, adult ([place]) 05/01/2016  . Osteoarthritis of lumbar spine 05/01/2016  . Disturbance in sleep behavior 12/20/2015  . Atherosclerosis of native coronary artery of transplanted heart 12/20/2015  . Cardiac chest pain 12/20/2015  . Encounter for screening for malignant neoplasm of colon 12/03/2015  . Impingement syndrome of shoulder 12/03/2015  . HLD (hyperlipidemia) 12/03/2015  . History of abdominal hernia 12/03/2015  . Clinical depression 12/03/2015  . Arteriosclerosis of coronary artery 12/03/2015  . Allergic rhinitis 12/03/2015  . Hypertension   . GERD (gastroesophageal reflux disease)   . H/O hiatal hernia   . Severe major depression with psychotic features ([place])   . Impingement  syndrome of left shoulder   . Coronary artery disease     Past Surgical History:  Procedure Laterality Date  . BILATERAL CARPAL TUNNEL RELEASE  2006  &  2009  . CARDIAC CATHETERIZATION  01-20-2008   DR Marlou Porch   MILD CAD/ RCA WITH UP TO 50% PROXIMAL /  DIFFUSE DISTAL IRREGULARITIES UP TO 30%/ NORMAL LVEF  . SHOULDER SURGERY     Left  . TONSILLECTOMY AND ADENOIDECTOMY  AS CHILD   AND PALETTE REPAIR  . TOTAL ABDOMINAL HYSTERECTOMY W/ BILATERAL SALPINGOOPHORECTOMY  2001    OB History    No data available       Home Medications    Prior to Admission medications   Medication Sig Start Date End Date Taking? Authorizing Provider  amLODipine (NORVASC) 5 MG tablet Take 2 tablets (10 mg total) by mouth every morning. Patient taking differently: Take 5 mg by mouth every morning.  11/03/12   Leonard Schwartz, MD  aspirin EC 81 MG tablet Take 81 mg by mouth every evening.     [provider]  atorvastatin (LIPITOR) 80 MG tablet Take 80 mg by mouth every evening. 12/25/15   [provider]  buPROPion (WELLBUTRIN XL) 150 MG 24 hr tablet Take 150-300 mg by mouth 2 (two) times daily. Takes x2 tabs in am and x1 tab in pm    [provider]  butalbital-acetaminophen-caffeine (FIORICET, ESGIC) 50-325-40 MG tablet Take 1-2 tablets by mouth every 6 (six) hours as needed  for headache. 08/23/17 08/23/18  Lacretia Leigh, MD  cyclobenzaprine (FLEXERIL) 10 MG tablet Take 10 mg by mouth 2 (two) times daily as needed. Muscle spasms 03/03/16   [provider]  gabapentin (NEURONTIN) 100 MG capsule Take 300 mg by mouth 2 (two) times daily.    [provider]  hydrOXYzine (VISTARIL) 50 MG capsule Take 50 mg by mouth 2 (two) times daily as needed for anxiety.    [provider]  omeprazole (PRILOSEC) 40 MG capsule Take 40 mg by mouth daily.     [provider]  perphenazine (TRILAFON) 4 MG tablet Take 2-6 mg by mouth 2 (two) times daily. 0.5 tab in am, 1.5  tab in pm    [provider]  predniSONE (STERAPRED UNI-PAK 21 TAB) 10 MG (21) TBPK tablet Take 6 tablets PO day one, then 5, 4, 3, 2, 1 09/30/17   Janit Bern, [place], NP  sertraline (ZOLOFT) 100 MG tablet Take 200 mg by mouth daily.    [provider]  sucralfate (CARAFATE) 1 g tablet Take 1 g by mouth 4 (four) times daily as needed (acid reflux).     [provider]    Family History Family History  Problem Relation Age of Onset  . Hypertension Mother     Social History Social History   Tobacco Use  . Smoking status: Never Smoker  . Smokeless tobacco: Never Used  Substance Use Topics  . Alcohol use: No  . Drug use: No     Allergies   Adhesive [tape]   Review of Systems Review of Systems  Constitutional: Negative for chills and fever.  Respiratory: Negative for shortness of breath.   Cardiovascular: Negative for chest pain.  Gastrointestinal: Negative for nausea and vomiting.  Musculoskeletal: Positive for arthralgias and joint swelling. Back pain: chronic. Gait problem: due to pain.  Skin: Negative for wound.  Neurological: Negative for numbness.     Physical Exam Updated Vital Signs BP 119/86 (BP Location: Left Arm)   Pulse 84   Temp 98.3 F (36.8 C) (Oral)   Resp 18   Wt 113.4 kg (250 lb)   SpO2 99%   BMI 38.01 kg/m   Physical Exam  Constitutional: She appears well-developed and well-nourished. No distress.  HENT:  Head: Normocephalic.  Eyes: EOM are normal.  Neck: Neck supple.  Cardiovascular: Normal rate and intact distal pulses.  Pulmonary/Chest: Effort normal.  Musculoskeletal:       Right knee: She exhibits swelling. She exhibits no deformity, no laceration, no erythema and normal alignment. Decreased range of motion: due to pain. Tenderness found.       Legs: Pedal pulse 2+  Neurological: She is alert.  Skin: Skin is warm and dry.  Psychiatric: She has a normal mood and affect.  Nursing note and vitals  reviewed.    ED Treatments / Results  Labs (all labs ordered are listed, but only abnormal results are displayed) Labs Reviewed - No data to display  Radiology No results found.  Procedures Procedures (including critical care time)  Medications Ordered in ED Medications - No data to display   Initial Impression / Assessment and Plan / ED Course  I have reviewed the triage vital signs and the nursing notes. 56 y.o. female with right knee pain and right posterior thigh pain stable for d/c with negative DVT study. Knee immobilizer applied, patient walking with a cane. She will f/u with her orthopedic doctor for further evaluation of the knee pain.  Final Clinical Impressions(s) / ED Diagnoses   Final diagnoses:  Posterior knee pain, right    ED Discharge Orders        Ordered    predniSONE (STERAPRED UNI-PAK 21 TAB) 10 MG (21) TBPK tablet     09/30/17 1418       Janit Bern [place], NP 09/30/17 2214    Fatima Blank, MD 10/01/17 [phone]

## 2017-10-28 DIAGNOSIS — R69 Illness, unspecified: Secondary | ICD-10-CM | POA: Diagnosis not present

## 2017-10-30 ENCOUNTER — Encounter: Payer: Medicare HMO | Attending: Physical Medicine & Rehabilitation

## 2017-10-30 ENCOUNTER — Encounter: Payer: Self-pay | Admitting: Physical Medicine & Rehabilitation

## 2017-10-30 ENCOUNTER — Ambulatory Visit (HOSPITAL_BASED_OUTPATIENT_CLINIC_OR_DEPARTMENT_OTHER): Payer: Medicare HMO | Admitting: Physical Medicine & Rehabilitation

## 2017-10-30 VITALS — BP 119/82 | HR 79 | Resp 16

## 2017-10-30 DIAGNOSIS — M47896 Other spondylosis, lumbar region: Secondary | ICD-10-CM | POA: Diagnosis not present

## 2017-10-30 DIAGNOSIS — I2510 Atherosclerotic heart disease of native coronary artery without angina pectoris: Secondary | ICD-10-CM | POA: Insufficient documentation

## 2017-10-30 DIAGNOSIS — R69 Illness, unspecified: Secondary | ICD-10-CM | POA: Diagnosis not present

## 2017-10-30 DIAGNOSIS — I10 Essential (primary) hypertension: Secondary | ICD-10-CM | POA: Insufficient documentation

## 2017-10-30 DIAGNOSIS — M545 Low back pain: Secondary | ICD-10-CM | POA: Insufficient documentation

## 2017-10-30 DIAGNOSIS — G8929 Other chronic pain: Secondary | ICD-10-CM | POA: Diagnosis present

## 2017-10-30 DIAGNOSIS — K219 Gastro-esophageal reflux disease without esophagitis: Secondary | ICD-10-CM | POA: Insufficient documentation

## 2017-10-30 DIAGNOSIS — M47816 Spondylosis without myelopathy or radiculopathy, lumbar region: Secondary | ICD-10-CM

## 2017-10-30 DIAGNOSIS — F323 Major depressive disorder, single episode, severe with psychotic features: Secondary | ICD-10-CM | POA: Insufficient documentation

## 2017-10-30 NOTE — Progress Notes (Signed)
  PROCEDURE RECORD [facility]   Name: RUWEYDA MACKNIGHT DOB:[birth year] MRN: [id number]  Date:10/30/2017  Physician: Alysia Penna, MD    Nurse/CMA: Niyla Marone, CMA  Allergies:  Allergies  Allergen Reactions  . Adhesive [Tape] Other (See Comments)    Irritation and tears skin    Consent Signed: Yes.    Is patient diabetic? No.  CBG today?   Pregnant: No. LMP: No LMP recorded. Patient has had a hysterectomy. (age 57-55)  Anticoagulants: no Anti-inflammatory: no Antibiotics: no  Procedure: right L3,4,5 radiofrequency Nreurotomy  Position: Prone Start Time: 11:51 am  End Time: 12:07pm  Fluoro Time: 56  RN/CMA Eldor Conaway, CMA Kendyl Festa, CMA    Time 11:40am 12:17pm    BP 119/82 119/80    Pulse 79 81    Respirations 14 14    O2 Sat 96 97    S/S 6 6    Pain Level 5/10 3/10     D/C home with sister, patient A & O X 3, D/C instructions reviewed, and sits independently.

## 2017-10-30 NOTE — Patient Instructions (Signed)
You had a radio frequency procedure today This was done to alleviate joint pain in your lumbar area We injected lidocaine which is a local anesthetic.  You may experience soreness at the injection sites. You may also experienced some irritation of the nerves that were heated I'm recommending ice for 30 minutes every 2 hours as needed for the next 24-48 hours   

## 2017-10-30 NOTE — Progress Notes (Signed)
RightL5 dorsal ramus., Right L4 and Right L3 medial branch radio frequency neurotomy under fluoroscopic guidance   Indication: Low back pain due to lumbar spondylosis which has been relieved on 2 occasions by greater than 50% by lumbar medial branch blocks at corresponding levels.  Informed consent was obtained after describing risks and benefits of the procedure with the patient, this includes bleeding, bruising, infection, paralysis and medication side effects. The patient wishes to proceed and has given written consent. The patient was placed in a prone position. The lumbar and sacral area was marked and prepped with Betadine. A 25-gauge 1-1/2 inch needle was inserted into the skin and subcutaneous tissue at 3 sites in one ML of 1% lidocaine was injected into each site. Then a 18-gauge 15 cm radio frequency needle with a 1 cm curved active tip was inserted targeting the Right S1 SAP/sacral ala junction. Bone contact was made and confirmed with lateral imaging.  motor stimulation at 2 Hz confirm proper needle location followed by injection of 46m 2% MPF lidocaine. Then the Right L5 SAP/transverse process junction was targeted. Bone contact was made and confirmed with lateral imaging.  motor stimulation at 2 Hz confirm proper needle location followed by injection of 145m2% MPF lidocaine. Then the Right L4 SAP/transverse process junction was targeted. Bone contact was made and confirmed with lateral imaging. motor stimulation at 2 Hz confirm proper needle location followed by injection of 63m9m% MPF lidocaine. Radio frequency [facility] 90 seconds was performed. Needles were removed. Post procedure instructions and vital signs were performed. Patient tolerated procedure well. Followup appointment was given.

## 2017-11-02 DIAGNOSIS — M1711 Unilateral primary osteoarthritis, right knee: Secondary | ICD-10-CM | POA: Diagnosis not present

## 2017-11-02 DIAGNOSIS — M25561 Pain in right knee: Secondary | ICD-10-CM | POA: Diagnosis not present

## 2017-11-16 IMAGING — DX DG FOREARM 2V*L*
2 series · 2 of 2 positions shown · non-contrast
Comparison: None.

CLINICAL DATA: Left forearm and elbow pain.  No known injury.

EXAM:
LEFT FOREARM - 2 VIEW

[forearm ap]
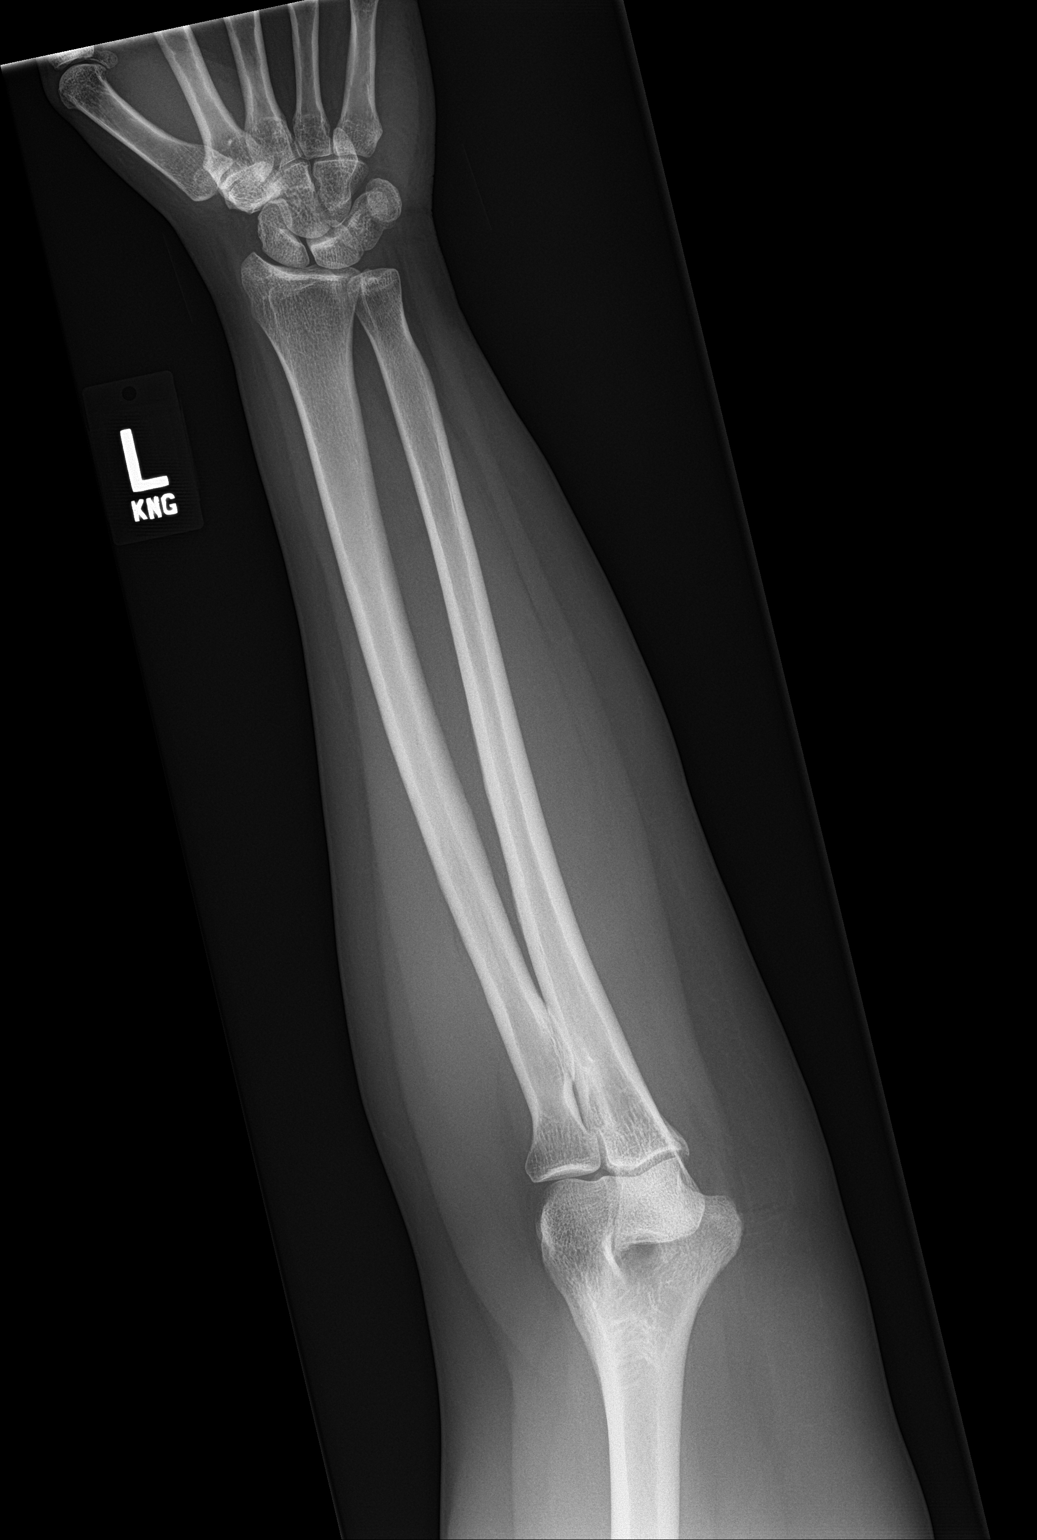

[forearm lat]
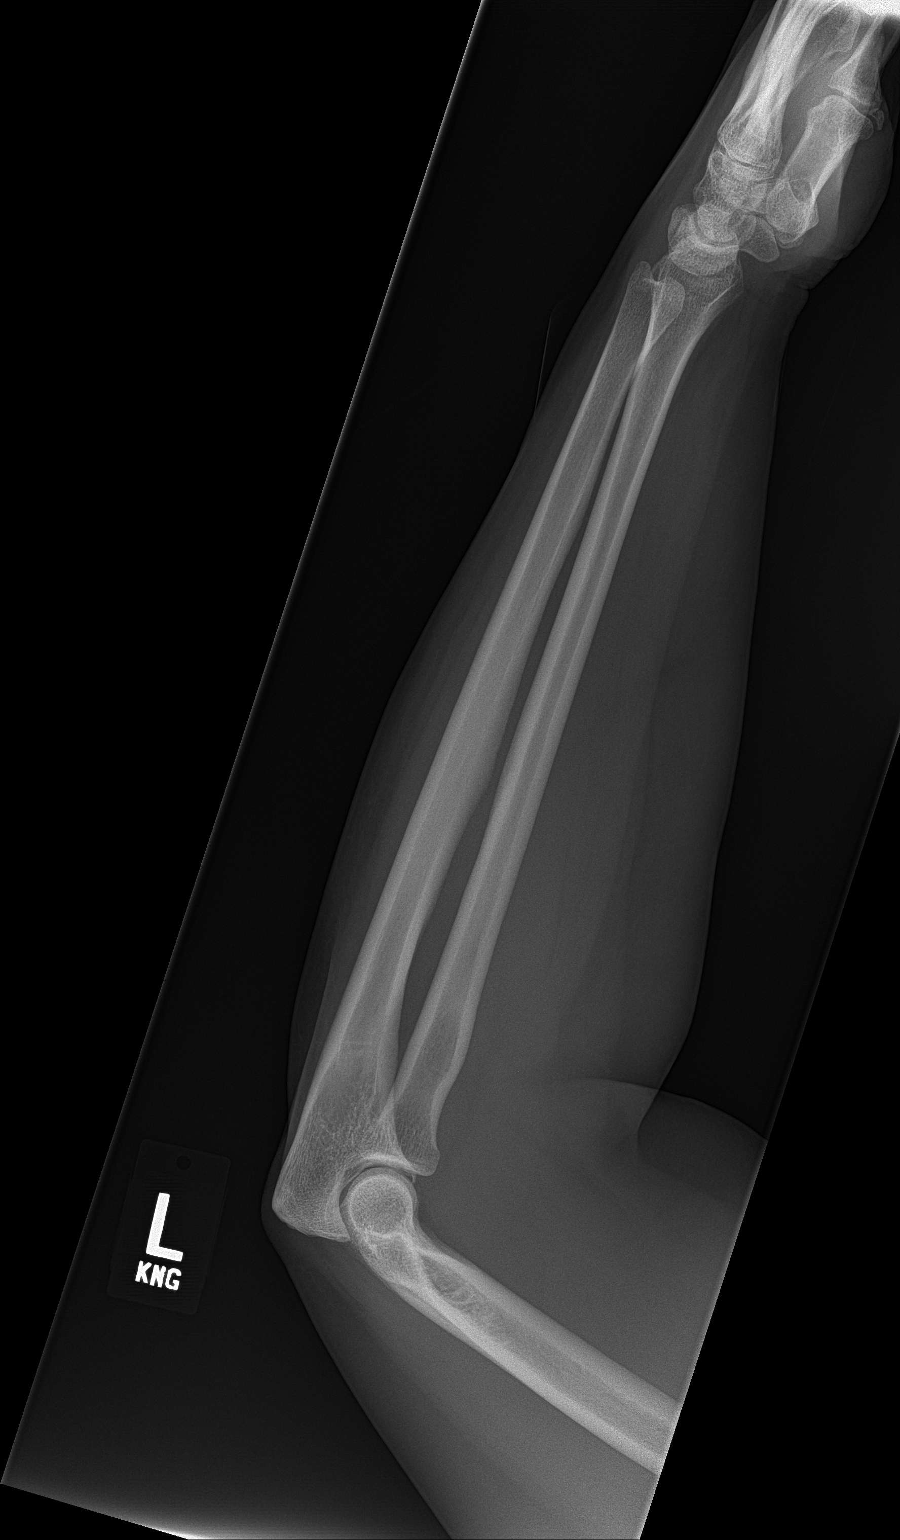

[2 of 2 positions shown; findings below may reference images not displayed]

FINDINGS: There is no evidence of fracture or other focal bone lesions. Soft
tissues are unremarkable.
IMPRESSION: Negative.

## 2017-12-08 DIAGNOSIS — E782 Mixed hyperlipidemia: Secondary | ICD-10-CM | POA: Diagnosis not present

## 2017-12-08 DIAGNOSIS — K210 Gastro-esophageal reflux disease with esophagitis: Secondary | ICD-10-CM | POA: Diagnosis not present

## 2017-12-08 DIAGNOSIS — I10 Essential (primary) hypertension: Secondary | ICD-10-CM | POA: Diagnosis not present

## 2018-01-06 DIAGNOSIS — R748 Abnormal levels of other serum enzymes: Secondary | ICD-10-CM | POA: Diagnosis not present

## 2018-01-06 DIAGNOSIS — Z1159 Encounter for screening for other viral diseases: Secondary | ICD-10-CM | POA: Diagnosis not present

## 2018-01-15 DIAGNOSIS — R748 Abnormal levels of other serum enzymes: Secondary | ICD-10-CM | POA: Diagnosis not present

## 2018-01-15 DIAGNOSIS — K760 Fatty (change of) liver, not elsewhere classified: Secondary | ICD-10-CM | POA: Diagnosis not present

## 2018-01-25 ENCOUNTER — Encounter: Payer: Medicare HMO | Attending: Physical Medicine & Rehabilitation

## 2018-01-25 ENCOUNTER — Encounter: Payer: Self-pay | Admitting: Physical Medicine & Rehabilitation

## 2018-01-25 ENCOUNTER — Ambulatory Visit (HOSPITAL_BASED_OUTPATIENT_CLINIC_OR_DEPARTMENT_OTHER): Payer: Medicare HMO | Admitting: Physical Medicine & Rehabilitation

## 2018-01-25 VITALS — BP 128/83 | HR 90 | Ht 68.0 in | Wt 255.0 lb

## 2018-01-25 DIAGNOSIS — M47816 Spondylosis without myelopathy or radiculopathy, lumbar region: Secondary | ICD-10-CM | POA: Diagnosis not present

## 2018-01-25 DIAGNOSIS — I2510 Atherosclerotic heart disease of native coronary artery without angina pectoris: Secondary | ICD-10-CM | POA: Diagnosis not present

## 2018-01-25 DIAGNOSIS — I10 Essential (primary) hypertension: Secondary | ICD-10-CM | POA: Insufficient documentation

## 2018-01-25 DIAGNOSIS — M47896 Other spondylosis, lumbar region: Secondary | ICD-10-CM | POA: Diagnosis not present

## 2018-01-25 DIAGNOSIS — F323 Major depressive disorder, single episode, severe with psychotic features: Secondary | ICD-10-CM | POA: Insufficient documentation

## 2018-01-25 DIAGNOSIS — K219 Gastro-esophageal reflux disease without esophagitis: Secondary | ICD-10-CM | POA: Insufficient documentation

## 2018-01-25 DIAGNOSIS — M545 Low back pain: Secondary | ICD-10-CM | POA: Insufficient documentation

## 2018-01-25 DIAGNOSIS — G8929 Other chronic pain: Secondary | ICD-10-CM | POA: Insufficient documentation

## 2018-01-25 DIAGNOSIS — R69 Illness, unspecified: Secondary | ICD-10-CM | POA: Diagnosis not present

## 2018-01-25 MED ORDER — KETOROLAC TROMETHAMINE 60 MG/2ML IM SOLN
60.0000 mg | Freq: Once | INTRAMUSCULAR | Status: AC
  Administered 2018-01-25: 60 mg via INTRAMUSCULAR

## 2018-01-25 NOTE — Patient Instructions (Signed)
We will go higher in the lumbar spine the left side for blocks and if this is helpful we need to repeat it once and then we can do the radiofrequency afterwards. In the meantime you can continue Tylenol heat ice creams and we will give you a Toradol shot today

## 2018-01-25 NOTE — Addendum Note (Signed)
Addended by: Geryl Rankins D on: 01/25/2018 11:10 AM   Modules accepted: Orders

## 2018-01-25 NOTE — Progress Notes (Signed)
Subjective:    Patient ID: [name], female    DOB: 1961/02/13, 57 y.o.   MRN: [id number]  HPI  Chief complaint left-sided low back pain  Right L3,4 5 RF performed 10/30/2017.  Patient notes some mild right-sided back pain returning but overall doing well with this.  Left L3-L4-L5 RF performed 09/25/2018.  Patient feels that the left-sided low back pain is a little higher up than it was previously. No pain going down into the legs. No lower extremity weakness  Patient is looking for other treatment options for her pain.  She has difficulty tolerating opioids such as oxycodone or Tylenol with codeine.  She has tried heat and ice as well as topical creams with only partial relief Ibuprofen causes GERD Pain Inventory Average Pain 7 Pain Right Now 8 My pain is burning, dull and aching  In the last 24 hours, has pain interfered with the following? General activity 8 Relation with others 8 Enjoyment of life 8 What TIME of day is your pain at its worst? all Sleep (in general) Poor  Pain is worse with: walking, bending, sitting and standing Pain improves with: rest, heat/ice, medication and injections Relief from Meds: 5  Mobility walk with assistance use a cane how many minutes can you walk? 5 ability to climb steps?  yes do you drive?  yes  Function not employed: date last employed .  Neuro/Psych bowel control problems tingling spasms depression anxiety  Prior Studies Any changes since last visit?  no  Physicians involved in your care Any changes since last visit?  no   Family History  Problem Relation Age of Onset  . Hypertension Mother    Social History   Socioeconomic History  . Marital status: Single    Spouse name: Not on file  . Number of children: Not on file  . Years of education: Not on file  . Highest education level: Not on file  Occupational History  . Not on file  Social Needs  . Financial resource strain: Not on file  . Food  insecurity:    Worry: Not on file    Inability: Not on file  . Transportation needs:    Medical: Not on file    Non-medical: Not on file  Tobacco Use  . Smoking status: Never Smoker  . Smokeless tobacco: Never Used  Substance and Sexual Activity  . Alcohol use: No  . Drug use: No  . Sexual activity: Not on file  Lifestyle  . Physical activity:    Days per week: Not on file    Minutes per session: Not on file  . Stress: Not on file  Relationships  . Social connections:    Talks on phone: Not on file    Gets together: Not on file    Attends religious service: Not on file    Active member of club or organization: Not on file    Attends meetings of clubs or organizations: Not on file    Relationship status: Not on file  Other Topics Concern  . Not on file  Social History Narrative  . Not on file   Past Surgical History:  Procedure Laterality Date  . BILATERAL CARPAL TUNNEL RELEASE  2006  &  2009  . CARDIAC CATHETERIZATION  01-20-2008   DR Marlou Porch   MILD CAD/ RCA WITH UP TO 50% PROXIMAL /  DIFFUSE DISTAL IRREGULARITIES UP TO 30%/ NORMAL LVEF  . SHOULDER SURGERY     Left  .  TONSILLECTOMY AND ADENOIDECTOMY  AS CHILD   AND PALETTE REPAIR  . TOTAL ABDOMINAL HYSTERECTOMY W/ BILATERAL SALPINGOOPHORECTOMY  2001   Past Medical History:  Diagnosis Date  . Coronary artery disease CARDIOLOGIST-- DR Marlou Porch--  WILL REQUEST NOTE   MILD NON-OBSTRUCTIVE CAD  . GERD (gastroesophageal reflux disease)   . H/O hiatal hernia   . Hypertension   . Impingement syndrome of left shoulder   . Knee pain    r/knee  . Severe major depression with psychotic features ([facility])    BP 128/83 (BP Location: Left Arm, Patient Position: Sitting, Cuff Size: Large)   Pulse 90   Ht 5\' 8"  (1.727 m)   Wt 255 lb (115.7 kg)   BMI 38.77 kg/m   Opioid Risk Score:   Fall Risk Score:  `1  Depression screen PHQ 2/9  No flowsheet data found.  Review of Systems  Constitutional: Negative.   HENT: Negative.     Eyes: Negative.   Respiratory: Negative.   Cardiovascular: Negative.   Gastrointestinal: Positive for constipation and diarrhea.  Endocrine: Negative.   Genitourinary: Negative.   Musculoskeletal: Positive for arthralgias, back pain, gait problem and myalgias.       Spasms  Skin: Negative.   Allergic/Immunologic: Negative.   Neurological:       Tingling  Psychiatric/Behavioral: Positive for dysphoric mood. The patient is nervous/anxious.   All other systems reviewed and are negative.      Objective:   Physical Exam  Constitutional: She is oriented to person, place, and time. She appears well-developed and well-nourished. No distress.  HENT:  Head: Normocephalic and atraumatic.  Eyes: Pupils are equal, round, and reactive to light. Conjunctivae and EOM are normal.  Neck: Normal range of motion.  Musculoskeletal:  Patient has tenderness to palpation along the left lumbar paraspinals from L1 through L3.  There is extension related pain in the same areas.  No flexion related pain.  In addition there is some pain with left lateral bending at the same area.  There is no pain in the bilateral L4 L5-S1 paraspinals.  Neurological: She is alert and oriented to person, place, and time.  Negative straight leg raise test Lower extremity strength 5/5 bilateral hip flexion extension ankle dorsiflexion  Skin: She is not diaphoretic.  Psychiatric: She has a normal mood and affect. Her behavior is normal. Judgment and thought content normal.  Nursing note and vitals reviewed.         Assessment & Plan:  1.  Lumbar spondylosis without myelopathy.  Patient has had good relief with the L3-L4 medial branch and L5 dorsal ramus radiofrequency neurotomies performed on the right side on 10/30/2017 and on the left side 09/25/2017. Her current complaints are on the left upper lumbar area. Will schedule for left T12 L1-L2 medial branch blocks under fluoroscopic guidance. In the meantime she can continue  heat and ice as well as topical medications.  We will give her Toradol 60 mg today

## 2018-02-08 DIAGNOSIS — R69 Illness, unspecified: Secondary | ICD-10-CM | POA: Diagnosis not present

## 2018-02-23 ENCOUNTER — Ambulatory Visit (HOSPITAL_BASED_OUTPATIENT_CLINIC_OR_DEPARTMENT_OTHER): Payer: Medicare HMO | Admitting: Physical Medicine & Rehabilitation

## 2018-02-23 ENCOUNTER — Encounter: Payer: Self-pay | Admitting: Physical Medicine & Rehabilitation

## 2018-02-23 VITALS — BP 118/71 | HR 89 | Ht 68.0 in | Wt 262.0 lb

## 2018-02-23 DIAGNOSIS — G8929 Other chronic pain: Secondary | ICD-10-CM | POA: Diagnosis not present

## 2018-02-23 DIAGNOSIS — M47816 Spondylosis without myelopathy or radiculopathy, lumbar region: Secondary | ICD-10-CM

## 2018-02-23 NOTE — Progress Notes (Signed)
  [facility]   Name: [name] DOB:[birth year] MRN: [id number]  Date:02/23/2018  Physician: Alysia Penna, MD    Nurse/CMA: Matteson Blue CMA / Truman Hayward CMA  Allergies:  Allergies  Allergen Reactions  . Adhesive [Tape] Other (See Comments)    Irritation and tears skin    Consent Signed: Yes.    Is patient diabetic? No.  CBG today? NA  Pregnant: No. LMP: No LMP recorded. Patient has had a hysterectomy. (age 57-55)  Anticoagulants: no Anti-inflammatory: yes (81mg  aspirin) Antibiotics: no  Procedure: Left T-12, L1-2 Medial Branch Blocks Position: Prone   Start Time: 3:31pm End Time: 3:39pm Fluoro Time: 30  RN/CMA Zaynah Chawla CMA Lee CMA    Time 254pm 3:42pm    BP 118/71 125 84    Pulse 89 89    Respirations 16 14    O2 Sat 95% 97    S/S 6 6    Pain Level 8/10 2/10     D/C home with Sister, patient A & O X 3, D/C instructions reviewed, and sits independently.

## 2018-02-23 NOTE — Patient Instructions (Signed)

## 2018-03-26 ENCOUNTER — Ambulatory Visit (HOSPITAL_BASED_OUTPATIENT_CLINIC_OR_DEPARTMENT_OTHER): Payer: Medicare HMO | Admitting: Physical Medicine & Rehabilitation

## 2018-03-26 ENCOUNTER — Encounter: Payer: Self-pay | Admitting: Physical Medicine & Rehabilitation

## 2018-03-26 ENCOUNTER — Encounter: Payer: Medicare HMO | Attending: Physical Medicine & Rehabilitation

## 2018-03-26 VITALS — BP 124/82 | HR 81 | Ht 68.0 in | Wt 258.0 lb

## 2018-03-26 DIAGNOSIS — K219 Gastro-esophageal reflux disease without esophagitis: Secondary | ICD-10-CM | POA: Diagnosis not present

## 2018-03-26 DIAGNOSIS — M47896 Other spondylosis, lumbar region: Secondary | ICD-10-CM | POA: Insufficient documentation

## 2018-03-26 DIAGNOSIS — R69 Illness, unspecified: Secondary | ICD-10-CM | POA: Diagnosis not present

## 2018-03-26 DIAGNOSIS — M47816 Spondylosis without myelopathy or radiculopathy, lumbar region: Secondary | ICD-10-CM

## 2018-03-26 DIAGNOSIS — F323 Major depressive disorder, single episode, severe with psychotic features: Secondary | ICD-10-CM | POA: Insufficient documentation

## 2018-03-26 DIAGNOSIS — G8929 Other chronic pain: Secondary | ICD-10-CM | POA: Diagnosis not present

## 2018-03-26 DIAGNOSIS — M545 Low back pain: Secondary | ICD-10-CM | POA: Diagnosis not present

## 2018-03-26 DIAGNOSIS — I10 Essential (primary) hypertension: Secondary | ICD-10-CM | POA: Diagnosis not present

## 2018-03-26 DIAGNOSIS — I2510 Atherosclerotic heart disease of native coronary artery without angina pectoris: Secondary | ICD-10-CM | POA: Insufficient documentation

## 2018-03-26 NOTE — Progress Notes (Signed)
Left T 12, L1, L2 medial branch blocks under fluoroscopic guidance  Indication: Lumbar pain which is not relieved by medication management or other conservative care and interfering with self-care and mobility.  Informed consent was obtained after describing risks and benefits of the procedure with the patient, this includes bleeding, bruising, infection, paralysis and medication side effects. The patient wishes to proceed and has given written consent. The patient was placed in a prone position. The lumbar area was marked and prepped with Betadine. One ML of 1% lidocaine was injected into each of 3 areas into the skin and subcutaneous tissue. Then a 22-gauge 5in spinal needle was inserted targeting the junction of the left L3 superior articular process /transverse process junction. Needle was advanced under fluoroscopic guidance. Bone contact was made. Omnipaque 180 was injected x0.5 mL demonstrating no intravascular uptake. Then a solution containing one ML of 4 mg per mL dexamethasone and 3 mL of 2% MPF lidocaine was injected x0.5 mL. Then the left L2 superior articular process in transverse process junction was targeted. Bone contact was made. Omnipaque 180 was injected x0.5 mL demonstrating no intravascular uptake. Then a solution containing one ML of 4 mg per mL dexamethasone and 3 mL of 2% MPF lidocaine was injected x0.5 mL. Then the left L1 superior articular process in transverse process junction was targeted. Bone contact was made. Omnipaque 180 was injected x0.5 mL demonstrating no intravascular uptake. Then a solution containing one ML of 4 mg per mL dexamethasone and 3 mL of 2% MPF lidocaine was injected x0.5 mL. Patient tolerated procedure well. Post procedure instructions were given. Please refer to post procedure form.

## 2018-03-26 NOTE — Progress Notes (Signed)
  [facility]   Name: [name] DOB:[birth year] MRN: [id number]  Date:03/26/2018  Physician: Alysia Penna, MD    Nurse/CMA: Malek Skog CMA  Allergies:  Allergies  Allergen Reactions  . Adhesive [Tape] Other (See Comments)    Irritation and tears skin    Consent Signed: Yes.    Is patient diabetic? No.  CBG today? NA  Pregnant: No. LMP: No LMP recorded. Patient has had a hysterectomy. (age [age])  Anticoagulants: no Anti-inflammatory: no Antibiotics: no  Procedure: Left T12, L1-2 MBB Position: Prone   Start Time: 1153am End Time: 1158am Fluoro Time: 28s  RN/CMA Jailynn Lavalais CMA Takyia Sindt CMA    Time 1121am 1204pm    BP 124/82 129/84    Pulse 81 84    Respirations 16 16    O2 Sat 98% 96    S/S 6/6 6/6    Pain Level 8/10 0/10     D/C home with Self/Sister, patient A & O X 3, D/C instructions reviewed, and sits independently.

## 2018-03-26 NOTE — Patient Instructions (Signed)

## 2018-04-26 ENCOUNTER — Encounter: Payer: Self-pay | Admitting: Physical Medicine & Rehabilitation

## 2018-04-26 ENCOUNTER — Ambulatory Visit (HOSPITAL_BASED_OUTPATIENT_CLINIC_OR_DEPARTMENT_OTHER): Payer: Medicare HMO | Admitting: Physical Medicine & Rehabilitation

## 2018-04-26 ENCOUNTER — Other Ambulatory Visit: Payer: Self-pay

## 2018-04-26 ENCOUNTER — Encounter: Payer: Medicare HMO | Attending: Physical Medicine & Rehabilitation

## 2018-04-26 VITALS — BP 130/83 | HR 89 | Ht 68.0 in | Wt 261.0 lb

## 2018-04-26 DIAGNOSIS — I10 Essential (primary) hypertension: Secondary | ICD-10-CM | POA: Insufficient documentation

## 2018-04-26 DIAGNOSIS — K219 Gastro-esophageal reflux disease without esophagitis: Secondary | ICD-10-CM | POA: Diagnosis not present

## 2018-04-26 DIAGNOSIS — M545 Low back pain: Secondary | ICD-10-CM | POA: Insufficient documentation

## 2018-04-26 DIAGNOSIS — M47896 Other spondylosis, lumbar region: Secondary | ICD-10-CM | POA: Insufficient documentation

## 2018-04-26 DIAGNOSIS — R69 Illness, unspecified: Secondary | ICD-10-CM | POA: Diagnosis not present

## 2018-04-26 DIAGNOSIS — M47816 Spondylosis without myelopathy or radiculopathy, lumbar region: Secondary | ICD-10-CM | POA: Diagnosis not present

## 2018-04-26 DIAGNOSIS — G8929 Other chronic pain: Secondary | ICD-10-CM | POA: Insufficient documentation

## 2018-04-26 DIAGNOSIS — I2510 Atherosclerotic heart disease of native coronary artery without angina pectoris: Secondary | ICD-10-CM | POA: Diagnosis not present

## 2018-04-26 DIAGNOSIS — F323 Major depressive disorder, single episode, severe with psychotic features: Secondary | ICD-10-CM | POA: Insufficient documentation

## 2018-04-26 NOTE — Progress Notes (Addendum)
Subjective:    Patient ID: [name], female    DOB: November 26, 1960, 57 y.o.   MRN: [id number]  HPI 56 year old female with history of lumbar spondylosis.  She has had good results with lumbar medial branch blocks L3-L4 as well as L5 dorsal ramus injections which produce greater than 50% relief on 2 occasions.  She has subsequently undergone radiofrequency neurotomy of the left L3 and L4 medial branches and left L5 dorsal ramus in November 2018 as well as right-sided L3-L4 medial branch and right L5 dorsal ramus radiofrequency neurotomy performed in January 2019.  Her chief complaint today is left-sided low back pain. She had good results with left T12 L1-L2 medial branch blocks performed 03/26/2018 as well as 02/23/2018. She is not sure the exact location of her pain.  She has no radiation down the left lower extremity.  No numbness or tingling.  No bowel or bladder dysfunction Pain Inventory Average Pain 7 Pain Right Now 7 My pain is sharp, stabbing and aching  In the last 24 hours, has pain interfered with the following? General activity 8 Relation with others 5 Enjoyment of life 7 What TIME of day is your pain at its worst? morning night Sleep (in general) Poor  Pain is worse with: walking, bending, sitting and standing Pain improves with: rest, heat/ice and injections Relief from Meds: 0  Mobility walk with assistance use a cane how many minutes can you walk? 5 ability to climb steps?  yes do you drive?  yes  Function not employed: date last employed 09/2006 I need assistance with the following:  dressing, household duties and shopping  Neuro/Psych weakness numbness spasms depression anxiety  Prior Studies x-rays CT/MRI  Physicians involved in your care Any changes since last visit?  no   Family History  Problem Relation Age of Onset  . Hypertension Mother    Social History   Socioeconomic History  . Marital status: Single    Spouse name: Not on file    . Number of children: Not on file  . Years of education: Not on file  . Highest education level: Not on file  Occupational History  . Not on file  Social Needs  . Financial resource strain: Not on file  . Food insecurity:    Worry: Not on file    Inability: Not on file  . Transportation needs:    Medical: Not on file    Non-medical: Not on file  Tobacco Use  . Smoking status: Never Smoker  . Smokeless tobacco: Never Used  Substance and Sexual Activity  . Alcohol use: No  . Drug use: No  . Sexual activity: Not on file  Lifestyle  . Physical activity:    Days per week: Not on file    Minutes per session: Not on file  . Stress: Not on file  Relationships  . Social connections:    Talks on phone: Not on file    Gets together: Not on file    Attends religious service: Not on file    Active member of club or organization: Not on file    Attends meetings of clubs or organizations: Not on file    Relationship status: Not on file  Other Topics Concern  . Not on file  Social History Narrative  . Not on file   Past Surgical History:  Procedure Laterality Date  . BILATERAL CARPAL TUNNEL RELEASE  2006  &  2009  . CARDIAC CATHETERIZATION  01-20-2008  DR Marlou Porch   MILD CAD/ RCA WITH UP TO 50% PROXIMAL /  DIFFUSE DISTAL IRREGULARITIES UP TO 30%/ NORMAL LVEF  . SHOULDER SURGERY     Left  . TONSILLECTOMY AND ADENOIDECTOMY  AS CHILD   AND PALETTE REPAIR  . TOTAL ABDOMINAL HYSTERECTOMY W/ BILATERAL SALPINGOOPHORECTOMY  2001   Past Medical History:  Diagnosis Date  . Coronary artery disease CARDIOLOGIST-- DR Marlou Porch--  WILL REQUEST NOTE   MILD NON-OBSTRUCTIVE CAD  . GERD (gastroesophageal reflux disease)   . H/O hiatal hernia   . Hypertension   . Impingement syndrome of left shoulder   . Knee pain    r/knee  . Severe major depression with psychotic features ([facility])    BP 130/83   Pulse 89   Ht 5\' 8"  (1.727 m)   Wt 261 lb (118.4 kg)   SpO2 97%   BMI 39.68 kg/m   Opioid  Risk Score:   Fall Risk Score:  `1  Depression screen PHQ 2/9  Depression screen PHQ 2/9 04/26/2018  Decreased Interest 1  Down, Depressed, Hopeless 1  PHQ - 2 Score 2    Review of Systems  Constitutional: Negative.   HENT: Negative.   Eyes: Negative.   Respiratory: Negative.   Cardiovascular: Negative.   Gastrointestinal: Negative.   Endocrine: Negative.   Genitourinary: Negative.   Musculoskeletal: Negative.   Skin: Negative.   Allergic/Immunologic: Negative.   Neurological: Negative.   Hematological: Negative.   Psychiatric/Behavioral: Negative.   All other systems reviewed and are negative.      Objective:   Physical Exam  General no acute distress Mood and affect are appropriate Gait is without evidence of toe drag or knee instability Negative straight leg raising Motor strength is 5/5 bilateral lower limbs Lumbar spine has reduced lumbar extension of less than 25% of normal, lumbar flexion is 50% of normal not accompanied by pain. She has tenderness palpation at left L4 paraspinal.  She has little pain above L4 and some moderate pain below L4 to palpation.      Assessment & Plan:  1.  Lumbar spondylosis without myelopathy.  It appears the current symptomatic levels are L4 and L5.  Therefore recommend  Repeat Left L3-4-5 RFA.  The last procedure was performed approximately 7 months ago.  She has likely re-innervated. Discussed with patient agrees with plan.

## 2018-04-28 DIAGNOSIS — R69 Illness, unspecified: Secondary | ICD-10-CM | POA: Diagnosis not present

## 2018-04-30 DIAGNOSIS — M1612 Unilateral primary osteoarthritis, left hip: Secondary | ICD-10-CM | POA: Diagnosis not present

## 2018-05-13 ENCOUNTER — Ambulatory Visit: Payer: Medicare HMO | Admitting: Physical Medicine & Rehabilitation

## 2018-08-06 DIAGNOSIS — L603 Nail dystrophy: Secondary | ICD-10-CM | POA: Diagnosis not present

## 2018-08-06 DIAGNOSIS — L600 Ingrowing nail: Secondary | ICD-10-CM | POA: Diagnosis not present

## 2018-08-06 DIAGNOSIS — B353 Tinea pedis: Secondary | ICD-10-CM | POA: Diagnosis not present

## 2018-08-06 DIAGNOSIS — M79674 Pain in right toe(s): Secondary | ICD-10-CM | POA: Diagnosis not present

## 2018-08-06 DIAGNOSIS — B351 Tinea unguium: Secondary | ICD-10-CM | POA: Diagnosis not present

## 2018-08-06 DIAGNOSIS — L851 Acquired keratosis [keratoderma] palmaris et plantaris: Secondary | ICD-10-CM | POA: Diagnosis not present

## 2018-08-06 DIAGNOSIS — M79675 Pain in left toe(s): Secondary | ICD-10-CM | POA: Diagnosis not present

## 2018-08-11 DIAGNOSIS — R69 Illness, unspecified: Secondary | ICD-10-CM | POA: Diagnosis not present

## 2018-09-17 DIAGNOSIS — B351 Tinea unguium: Secondary | ICD-10-CM | POA: Diagnosis not present

## 2018-09-17 DIAGNOSIS — M79675 Pain in left toe(s): Secondary | ICD-10-CM | POA: Diagnosis not present

## 2018-09-17 DIAGNOSIS — B353 Tinea pedis: Secondary | ICD-10-CM | POA: Diagnosis not present

## 2018-09-17 DIAGNOSIS — L851 Acquired keratosis [keratoderma] palmaris et plantaris: Secondary | ICD-10-CM | POA: Diagnosis not present

## 2018-09-17 DIAGNOSIS — L603 Nail dystrophy: Secondary | ICD-10-CM | POA: Diagnosis not present

## 2018-09-17 DIAGNOSIS — L600 Ingrowing nail: Secondary | ICD-10-CM | POA: Diagnosis not present

## 2018-09-17 DIAGNOSIS — M79674 Pain in right toe(s): Secondary | ICD-10-CM | POA: Diagnosis not present

## 2018-10-06 DIAGNOSIS — H04123 Dry eye syndrome of bilateral lacrimal glands: Secondary | ICD-10-CM | POA: Diagnosis not present

## 2018-10-06 DIAGNOSIS — H2513 Age-related nuclear cataract, bilateral: Secondary | ICD-10-CM | POA: Diagnosis not present

## 2018-10-06 DIAGNOSIS — H01024 Squamous blepharitis left upper eyelid: Secondary | ICD-10-CM | POA: Diagnosis not present

## 2018-10-06 DIAGNOSIS — H35033 Hypertensive retinopathy, bilateral: Secondary | ICD-10-CM | POA: Diagnosis not present

## 2018-10-06 DIAGNOSIS — H01021 Squamous blepharitis right upper eyelid: Secondary | ICD-10-CM | POA: Diagnosis not present

## 2018-10-06 DIAGNOSIS — H01025 Squamous blepharitis left lower eyelid: Secondary | ICD-10-CM | POA: Diagnosis not present

## 2018-10-06 DIAGNOSIS — D3132 Benign neoplasm of left choroid: Secondary | ICD-10-CM | POA: Diagnosis not present

## 2018-10-06 DIAGNOSIS — H04129 Dry eye syndrome of unspecified lacrimal gland: Secondary | ICD-10-CM | POA: Diagnosis not present

## 2018-10-06 DIAGNOSIS — H01022 Squamous blepharitis right lower eyelid: Secondary | ICD-10-CM | POA: Diagnosis not present

## 2019-07-18 NOTE — H&P (Addendum)
TOTAL KNEE ADMISSION H&P  Patient is being admitted for right total knee arthroplasty.  Subjective:  Chief Complaint:right knee pain.  HPI: [name], 58 y.o. female, has a history of pain and functional disability in the right knee due to arthritis and has failed non-surgical conservative treatments for greater than 12 weeks to includecorticosteriod injections, viscosupplementation injections and activity modification.  Onset of symptoms was gradual, starting 2 years ago with gradually worsening course since that time. The patient noted no past surgery on the right knee(s).  Patient currently rates pain in the right knee(s) at 8 out of 10 with activity. Patient has worsening of pain with activity and weight bearing, pain that interferes with activities of daily living and joint swelling.  Patient has evidence of degenerative changes in the medial and patellofemoral compartments with a small posterior horn medial meniscus tear by imaging studies. There is no active infection.  Patient Active Problem List   Diagnosis Date Noted  . Degenerative spondylolisthesis 07/10/2017  . Spondylosis without myelopathy or radiculopathy, lumbar region 07/10/2017  . Morbid obesity with BMI of 40.0-44.9, adult ([place]) 05/01/2016  . Osteoarthritis of lumbar spine 05/01/2016  . Disturbance in sleep behavior 12/20/2015  . Atherosclerosis of native coronary artery of transplanted heart 12/20/2015  . Cardiac chest pain 12/20/2015  . Encounter for screening for malignant neoplasm of colon 12/03/2015  . Impingement syndrome of shoulder 12/03/2015  . HLD (hyperlipidemia) 12/03/2015  . History of abdominal hernia 12/03/2015  . Clinical depression 12/03/2015  . Arteriosclerosis of coronary artery 12/03/2015  . Allergic rhinitis 12/03/2015  . Hypertension   . GERD (gastroesophageal reflux disease)   . H/O hiatal hernia   . Severe major depression with psychotic features ([place])   . Impingement syndrome of left  shoulder   . Coronary artery disease    Past Medical History:  Diagnosis Date  . Coronary artery disease CARDIOLOGIST-- DR Marlou Porch--  WILL REQUEST NOTE   MILD NON-OBSTRUCTIVE CAD  . GERD (gastroesophageal reflux disease)   . H/O hiatal hernia   . Hypertension   . Impingement syndrome of left shoulder   . Knee pain    r/knee  . Severe major depression with psychotic features [facility])     Past Surgical History:  Procedure Laterality Date  . BILATERAL CARPAL TUNNEL RELEASE  2006  &  2009  . CARDIAC CATHETERIZATION  01-20-2008   DR Marlou Porch   MILD CAD/ RCA WITH UP TO 50% PROXIMAL /  DIFFUSE DISTAL IRREGULARITIES UP TO 30%/ NORMAL LVEF  . SHOULDER SURGERY     Left  . TONSILLECTOMY AND ADENOIDECTOMY  AS CHILD   AND PALETTE REPAIR  . TOTAL ABDOMINAL HYSTERECTOMY W/ BILATERAL SALPINGOOPHORECTOMY  2001    No current facility-administered medications for this encounter.    Current Outpatient Medications  Medication Sig Dispense Refill Last Dose  . amLODipine (NORVASC) 10 MG tablet Take 5 mg by mouth daily.     Marland Kitchen aspirin EC 81 MG tablet Take 81 mg by mouth every evening.      Marland Kitchen atorvastatin (LIPITOR) 80 MG tablet Take 80 mg by mouth at bedtime.   1   . buPROPion (WELLBUTRIN XL) 150 MG 24 hr tablet Take 150 mg by mouth 3 (three) times daily.      . cholecalciferol (VITAMIN D) 25 MCG (1000 UT) tablet Take 1,000 Units by mouth daily.     . cyclobenzaprine (FLEXERIL) 10 MG tablet Take 10 mg by mouth 2 (two) times daily as needed. Muscle  spasms  1   . gabapentin (NEURONTIN) 300 MG capsule Take 300 mg by mouth 2 (two) times daily.     . hydrOXYzine (ATARAX/VISTARIL) 25 MG tablet Take 25 mg by mouth 2 (two) times daily as needed for anxiety.     . pantoprazole (PROTONIX) 40 MG tablet Take 40 mg by mouth daily.     . prazosin (MINIPRESS) 1 MG capsule Take 1 mg by mouth at bedtime.     Marland Kitchen QUEtiapine (SEROQUEL) 200 MG tablet Take 200 mg by mouth at bedtime.     Marland Kitchen QUEtiapine (SEROQUEL) 50 MG tablet Take  50 mg by mouth daily.     . sertraline (ZOLOFT) 100 MG tablet Take 150 mg by mouth daily.      . sucralfate (CARAFATE) 1 g tablet Take 1 g by mouth 4 (four) times daily as needed (acid reflux).       Allergies  Allergen Reactions  . Adhesive [Tape] Other (See Comments)    Irritation and tears skin    Social History   Tobacco Use  . Smoking status: Never Smoker  . Smokeless tobacco: Never Used  Substance Use Topics  . Alcohol use: No    Family History  Problem Relation Age of Onset  . Hypertension Mother      ROS Constitutional: Constitutional: no fever, chills, night sweats, or significant weight loss. Cardiovascular: Cardiovascular: no palpitations or chest pain. Respiratory: Respiratory: no cough or shortness of breath and No COPD. Gastrointestinal: Gastrointestinal: no vomiting or nausea. Musculoskeletal: Musculoskeletal: Joint Pain and swelling in Joints. Neurologic: Neurologic: no numbness, tingling, or difficulty with balance.  Objective:  Physical Exam Patient is a 57 year old female.  Well nourished and well developed. General: Alert and oriented x3, cooperative and pleasant, no acute distress. Head: normocephalic, atraumatic, neck supple. Eyes: EOMI. Respiratory: breath sounds clear in all fields, no wheezing, rales, or rhonchi. Cardiovascular: Regular rate and rhythm, no murmurs, gallops or rubs. Abdomen: non-tender to palpation and soft, normoactive bowel sounds.  Musculoskeletal: Right Knee Exam: Slight varus alignment. No effusion present. No swelling present. The range of motion is: 5 to 125 degrees. Moderate crepitus on range of motion of the knee. Tender medial greater than lateral joint lines. The knee is stable.  Calves soft and nontender. Motor function intact in LE. Strength 5/5 LE bilaterally. Neuro: Distal pulses 2+. Sensation to light touch intact in LE.  Vital signs in last 24 hours: Labs:   Estimated body mass index is 39.68 kg/m as  calculated from the following:   Height as of 04/26/18: 5\' 8"  (1.727 m).   Weight as of 04/26/18: 118.4 kg.   Imaging Review Plain radiographs demonstrate severe degenerative joint disease of the right knee(s). The overall alignment isneutral. The bone quality appears to be adequate for age and reported activity level.  Assessment/Plan:  End stage arthritis, right knee   The patient history, physical examination, clinical judgment of the provider and imaging studies are consistent with end stage degenerative joint disease of the right knee(s) and total knee arthroplasty is deemed medically necessary. The treatment options including medical management, injection therapy arthroscopy and arthroplasty were discussed at length. The risks and benefits of total knee arthroplasty were presented and reviewed. The risks due to aseptic loosening, infection, stiffness, patella tracking problems, thromboembolic complications and other imponderables were discussed. The patient acknowledged the explanation, agreed to proceed with the plan and consent was signed. Patient is being admitted for inpatient treatment for surgery, pain control, PT, OT, prophylactic  antibiotics, VTE prophylaxis, progressive ambulation and ADL's and discharge planning. The patient is planning to be discharged home.   Therapy Plans: outpatient therapy at [facility] Disposition: Home with sister Planned DVT Prophylaxis: aspirin 325mg  BID DME needed: walker, 3-n-1 PCP: Dr. Jetta Lout TXA: IV Allergies: adhesive tape - itching Anesthesia Concerns: none BMI: 40.7 Last HgbA1c: 6.7%  Other: none  Instructed patient on which medications to discontinue 5 days prior to surgery. Will follow-up in office with Dr. Wynelle Link 2 weeks post-op.  Patient's anticipated LOS is less than 2 midnights, meeting these requirements: - Younger than 2 - Lives within 1 hour of care - Has a competent adult at home to recover with post-op recover - NO  history of  - Chronic pain requiring opiods  - Diabetes  - Coronary Artery Disease  - Heart failure  - Heart attack  - Stroke  - DVT/VTE  - Cardiac arrhythmia  - Respiratory Failure/COPD  - Renal failure  - Anemia  - Advanced Liver disease  - Patient was instructed on what medications to stop prior to surgery. - Follow-up visit in 2 weeks with Dr. Wynelle Link - Begin physical therapy following surgery - Pre-operative lab work as pre-surgical testing - Prescriptions will be provided in hospital at time of discharge  Griffith Citron, PA-C Orthopedic Surgery [facility] [place] [phone]

## 2019-07-19 NOTE — Patient Instructions (Addendum)
DUE TO COVID-19 ONLY ONE VISITOR IS ALLOWED TO COME WITH YOU AND STAY IN THE WAITING ROOM ONLY DURING PRE OP AND PROCEDURE. THE ONE VISITOR MAY VISIT WITH YOU IN YOUR PRIVATE ROOM DURING VISITING HOURS ONLY!!   COVID SWAB TESTING MUST BE COMPLETED ON:  Thursday, Sept. 24, 2020 at [place], [facility] enter pre surgical testing line (Must self quarantine after testing. Follow instructions on handout.)             Your procedure is scheduled on: Monday, Sept. 28, 2020   Report to [facility] Main  Entrance    Report to admitting at 9:00 AM   Call this number if you have problems the morning of surgery [phone]   Do not eat food:After Midnight.   May have liquids until 8:30AM day of surgery   CLEAR LIQUID DIET  Foods Allowed                                                                     Foods Excluded  Water, Black Coffee and tea, regular and decaf                             liquids that you cannot  Plain Jell-O in any flavor  (No red)                                           see through such as: Fruit ices (not with fruit pulp)                                     milk, soups, orange juice  Iced Popsicles (No red)                                    All solid food Carbonated beverages, regular and diet                                    Apple juices Sports drinks like Gatorade (No red) Lightly seasoned clear broth or consume(fat free) Sugar, honey syrup  Sample Menu Breakfast                                Lunch                                     Supper Cranberry juice                    Beef broth                            Chicken broth Jell-O  Grape juice                           Apple juice Coffee or tea                        Jell-O                                      Popsicle                                                Coffee or tea                        Coffee or  tea   Complete one Ensure drink the morning of surgery at 8:30AM the day of surgery.   Brush your teeth the morning of surgery.   Do NOT smoke after Midnight   Take these medicines the morning of surgery with A SIP OF WATER: Amlodipine, Bupropion, Gabapentin, Pantoprazole, Seroquel, Sertraline                               You may not have any metal on your body including hair pins, jewelry, and body piercings             Do not wear make-up, lotions, powders, perfumes/cologne, or deodorant             Do not wear nail polish.  Do not shave  48 hours prior to surgery.               Do not bring valuables to the hospital. [place].   Contacts, dentures or bridgework may not be worn into surgery.   Bring small overnight bag day of surgery.    Special Instructions: Bring a copy of your healthcare power of attorney and living will documents         the day of surgery if you haven't scanned them in before.              Please read over the following fact sheets you were given:  [facility] - Preparing for Surgery Before surgery, you can play an important role.  Because skin is not sterile, your skin needs to be as free of germs as possible.  You can reduce the number of germs on your skin by washing with CHG (chlorahexidine gluconate) soap before surgery.  CHG is an antiseptic cleaner which kills germs and bonds with the skin to continue killing germs even after washing. Please DO NOT use if you have an allergy to CHG or antibacterial soaps.  If your skin becomes reddened/irritated stop using the CHG and inform your nurse when you arrive at Short Stay. Do not shave (including legs and underarms) for at least 48 hours prior to the first CHG shower.  You may shave your face/neck.  Please follow these instructions carefully:  1.  Shower with CHG Soap the night before surgery and the  morning of surgery.  2.  If you  choose to wash your hair,  wash your hair first as usual with your normal  shampoo.  3.  After you shampoo, rinse your hair and body thoroughly to remove the shampoo.                             4.  Use CHG as you would any other liquid soap.  You can apply chg directly to the skin and wash.  Gently with a scrungie or clean washcloth.  5.  Apply the CHG Soap to your body ONLY FROM THE NECK DOWN.   Do   not use on face/ open                           Wound or open sores. Avoid contact with eyes, ears mouth and   genitals (private parts).                       Wash face,  Genitals (private parts) with your normal soap.             6.  Wash thoroughly, paying special attention to the area where your    surgery  will be performed.  7.  Thoroughly rinse your body with warm water from the neck down.  8.  DO NOT shower/wash with your normal soap after using and rinsing off the CHG Soap.                9.  Pat yourself dry with a clean towel.            10.  Wear clean pajamas.            11.  Place clean sheets on your bed the night of your first shower and do not  sleep with pets. Day of Surgery : Do not apply any lotions/deodorants the morning of surgery.  Please wear clean clothes to the hospital/surgery center.  FAILURE TO FOLLOW THESE INSTRUCTIONS MAY RESULT IN THE CANCELLATION OF YOUR SURGERY  PATIENT SIGNATURE_________________________________  NURSE SIGNATURE__________________________________  ________________________________________________________________________   Adam Phenix  An incentive spirometer is a tool that can help keep your lungs clear and active. This tool measures how well you are filling your lungs with each breath. Taking long deep breaths may help reverse or decrease the chance of developing breathing (pulmonary) problems (especially infection) following:  A long period of time when you are unable to move or be active. BEFORE THE PROCEDURE   If the spirometer includes an indicator to  show your best effort, your nurse or respiratory therapist will set it to a desired goal.  If possible, sit up straight or lean slightly forward. Try not to slouch.  Hold the incentive spirometer in an upright position. INSTRUCTIONS FOR USE  1. Sit on the edge of your bed if possible, or sit up as far as you can in bed or on a chair. 2. Hold the incentive spirometer in an upright position. 3. Breathe out normally. 4. Place the mouthpiece in your mouth and seal your lips tightly around it. 5. Breathe in slowly and as deeply as possible, raising the piston or the ball toward the top of the column. 6. Hold your breath for 3-5 seconds or for as long as possible. Allow the piston or ball to fall to the bottom of the column. 7. Remove the mouthpiece from your mouth and breathe  out normally. 8. Rest for a few seconds and repeat Steps 1 through 7 at least 10 times every 1-2 hours when you are awake. Take your time and take a few normal breaths between deep breaths. 9. The spirometer may include an indicator to show your best effort. Use the indicator as a goal to work toward during each repetition. 10. After each set of 10 deep breaths, practice coughing to be sure your lungs are clear. If you have an incision (the cut made at the time of surgery), support your incision when coughing by placing a pillow or rolled up towels firmly against it. Once you are able to get out of bed, walk around indoors and cough well. You may stop using the incentive spirometer when instructed by your caregiver.  RISKS AND COMPLICATIONS  Take your time so you do not get dizzy or light-headed.  If you are in pain, you may need to take or ask for pain medication before doing incentive spirometry. It is harder to take a deep breath if you are having pain. AFTER USE  Rest and breathe slowly and easily.  It can be helpful to keep track of a log of your progress. Your caregiver can provide you with a simple table to help with  this. If you are using the spirometer at home, follow these instructions: [place] IF:   You are having difficultly using the spirometer.  You have trouble using the spirometer as often as instructed.  Your pain medication is not giving enough relief while using the spirometer.  You develop fever of 100.5 F (38.1 C) or higher. SEEK IMMEDIATE MEDICAL CARE IF:   You cough up bloody sputum that had not been present before.  You develop fever of 102 F (38.9 C) or greater.  You develop worsening pain at or near the incision site. MAKE SURE YOU:   Understand these instructions.  Will watch your condition.  Will get help right away if you are not doing well or get worse. Document Released: 02/23/2007 Document Revised: 01/05/2012 Document Reviewed: 04/26/2007 ExitCare Patient Information 2014 ExitCare, [place].   ________________________________________________________________________  WHAT IS A BLOOD TRANSFUSION? Blood Transfusion Information  A transfusion is the replacement of blood or some of its parts. Blood is made up of multiple cells which provide different functions.  Red blood cells carry oxygen and are used for blood loss replacement.  White blood cells fight against infection.  Platelets control bleeding.  Plasma helps clot blood.  Other blood products are available for specialized needs, such as hemophilia or other clotting disorders. BEFORE THE TRANSFUSION  Who gives blood for transfusions?   Healthy volunteers who are fully evaluated to make sure their blood is safe. This is blood bank blood. Transfusion therapy is the safest it has ever been in the practice of medicine. Before blood is taken from a donor, a complete history is taken to make sure that person has no history of diseases nor engages in risky social behavior (examples are intravenous drug use or sexual activity with multiple partners). The donor's travel history is screened to minimize  risk of transmitting infections, such as malaria. The donated blood is tested for signs of infectious diseases, such as HIV and hepatitis. The blood is then tested to be sure it is compatible with you in order to minimize the chance of a transfusion reaction. If you or a relative donates blood, this is often done in anticipation of surgery and is not appropriate for emergency  situations. It takes many days to process the donated blood. RISKS AND COMPLICATIONS Although transfusion therapy is very safe and saves many lives, the main dangers of transfusion include:   Getting an infectious disease.  Developing a transfusion reaction. This is an allergic reaction to something in the blood you were given. Every precaution is taken to prevent this. The decision to have a blood transfusion has been considered carefully by your caregiver before blood is given. Blood is not given unless the benefits outweigh the risks. AFTER THE TRANSFUSION  Right after receiving a blood transfusion, you will usually feel much better and more energetic. This is especially true if your red blood cells have gotten low (anemic). The transfusion raises the level of the red blood cells which carry oxygen, and this usually causes an energy increase.  The nurse administering the transfusion will monitor you carefully for complications. HOME CARE INSTRUCTIONS  No special instructions are needed after a transfusion. You may find your energy is better. Speak with your caregiver about any limitations on activity for underlying diseases you may have. SEEK MEDICAL CARE IF:   Your condition is not improving after your transfusion.  You develop redness or irritation at the intravenous (IV) site. SEEK IMMEDIATE MEDICAL CARE IF:  Any of the following symptoms occur over the next 12 hours:  Shaking chills.  You have a temperature by mouth above 102 F (38.9 C), not controlled by medicine.  Chest, back, or muscle pain.  People  around you feel you are not acting correctly or are confused.  Shortness of breath or difficulty breathing.  Dizziness and fainting.  You get a rash or develop hives.  You have a decrease in urine output.  Your urine turns a dark color or changes to pink, red, or brown. Any of the following symptoms occur over the next 10 days:  You have a temperature by mouth above 102 F (38.9 C), not controlled by medicine.  Shortness of breath.  Weakness after normal activity.  The white part of the eye turns yellow (jaundice).  You have a decrease in the amount of urine or are urinating less often.  Your urine turns a dark color or changes to pink, red, or brown. Document Released: 10/10/2000 Document Revised: 01/05/2012 Document Reviewed: 05/29/2008 [facility] Patient Information 2014 [place], [place].  _______________________________________________________________________

## 2019-07-20 ENCOUNTER — Encounter (HOSPITAL_COMMUNITY)
Admission: RE | Admit: 2019-07-20 | Discharge: 2019-07-20 | Disposition: A | Discharge status: Home / Self Care | Payer: No Typology Code available for payment source | Source: Ambulatory Visit | Attending: Orthopedic Surgery | Admitting: Orthopedic Surgery

## 2019-07-20 ENCOUNTER — Encounter (HOSPITAL_COMMUNITY): Payer: Self-pay

## 2019-07-20 ENCOUNTER — Other Ambulatory Visit: Payer: Self-pay

## 2019-07-20 DIAGNOSIS — Z01818 Encounter for other preprocedural examination: Secondary | ICD-10-CM | POA: Insufficient documentation

## 2019-07-20 DIAGNOSIS — I498 Other specified cardiac arrhythmias: Secondary | ICD-10-CM | POA: Diagnosis not present

## 2019-07-20 HISTORY — DX: Prediabetes: R73.03

## 2019-07-20 HISTORY — DX: Personal history of diseases of the blood and blood-forming organs and certain disorders involving the immune mechanism: Z86.2

## 2019-07-20 HISTORY — DX: Unspecified osteoarthritis, unspecified site: M19.90

## 2019-07-20 HISTORY — DX: Personal history of Methicillin resistant Staphylococcus aureus infection: Z86.14

## 2019-07-20 HISTORY — DX: Personal history of other diseases of the nervous system and sense organs: Z86.69

## 2019-07-20 LAB — COMPREHENSIVE METABOLIC PANEL
ALT: 32 U/L (ref 0–44)
AST: 23 U/L (ref 15–41)
Albumin: 3.9 g/dL (ref 3.5–5.0)
Alkaline Phosphatase: 100 U/L (ref 38–126)
Anion gap: 9 (ref 5–15)
BUN: 15 mg/dL (ref 6–20)
CO2: 26 mmol/L (ref 22–32)
Calcium: 8.5 mg/dL — ABNORMAL LOW (ref 8.9–10.3)
Chloride: 106 mmol/L (ref 98–111)
Creatinine, Ser: 1.06 mg/dL — ABNORMAL HIGH (ref 0.44–1.00)
GFR calc Af Amer: >60 mL/min (ref >60)
GFR calc non Af Amer: 58 mL/min — ABNORMAL LOW (ref >60)
Glucose, Bld: 93 mg/dL (ref 70–99)
Potassium: 3.4 mmol/L — ABNORMAL LOW (ref 3.5–5.1)
Sodium: 141 mmol/L (ref 135–145)
Total Bilirubin: 0.5 mg/dL (ref 0.3–1.2)
Total Protein: 7.1 g/dL (ref 6.5–8.1)

## 2019-07-20 LAB — SURGICAL PCR SCREEN
MRSA, PCR: NEGATIVE
Staphylococcus aureus: NEGATIVE

## 2019-07-20 LAB — CBC
HCT: 40.4 % (ref 36.0–46.0)
Hemoglobin: 12.7 g/dL (ref 12.0–15.0)
MCH: 28 pg (ref 26.0–34.0)
MCHC: 31.4 g/dL (ref 30.0–36.0)
MCV: 89 fL (ref 80.0–100.0)
Platelets: 263 10*3/uL (ref 150–400)
RBC: 4.54 MIL/uL (ref 3.87–5.11)
RDW: 13.4 % (ref 11.5–15.5)
WBC: 4.2 10*3/uL (ref 4.0–10.5)
nRBC: 0 % (ref 0.0–0.2)

## 2019-07-20 LAB — PROTIME-INR
INR: 1 (ref 0.8–1.2)
Prothrombin Time: 13.1 seconds (ref 11.4–15.2)

## 2019-07-20 LAB — APTT: aPTT: 32 seconds (ref 24–36)

## 2019-07-20 LAB — ABO/RH: ABO/RH(D): A POS

## 2019-07-20 NOTE — Progress Notes (Signed)
PCP - Dr. Jetta Lout, [facility] hospital Cardiologist - Dr. Candee Furbish  Chest x-ray - N/A EKG - 07/20/2019 in epic Stress Test - N/A ECHO - N/A Cardiac Cath - N/A  Sleep Study [facility] 2020 CPAP - No CPAP  Fasting Blood Sugar - N/A Checks Blood Sugar _N/A____ times a day  Blood Thinner Instructions:N/A Aspirin Instructions: 5 days prior to surgery Last Dose:  Anesthesia review: CAD, HTN, Pre diabetes  Patient denies shortness of breath, fever, cough and chest pain at PAT appointment   Patient verbalized understanding of instructions that were given to them at the PAT appointment. Patient was also instructed that they will need to review over the PAT instructions again at home before surgery.

## 2019-07-20 NOTE — Progress Notes (Signed)
SPOKE W/  [name]     SCREENING SYMPTOMS OF COVID 19:   COUGH--NO  RUNNY NOSE--- NO  SORE THROAT---NO  NASAL CONGESTION----NO  SNEEZING----NO  SHORTNESS OF BREATH---NO  DIFFICULTY BREATHING---NO  TEMP >100.0 -----NO  UNEXPLAINED BODY ACHES------NO  CHILLS -------- NO  HEADACHES ---------NO  LOSS OF SMELL/ TASTE --------NO    HAVE YOU OR ANY FAMILY MEMBER TRAVELLED PAST 14 DAYS OUT OF THE   COUNTY---NO STATE----NO COUNTRY----NO  HAVE YOU OR ANY FAMILY MEMBER BEEN EXPOSED TO ANYONE WITH COVID 19? NO

## 2019-07-21 ENCOUNTER — Other Ambulatory Visit (HOSPITAL_COMMUNITY)
Admission: RE | Admit: 2019-07-21 | Discharge: 2019-07-21 | Disposition: A | Discharge status: Home / Self Care | Payer: No Typology Code available for payment source | Source: Ambulatory Visit | Attending: Orthopedic Surgery | Admitting: Orthopedic Surgery

## 2019-07-21 DIAGNOSIS — Z20828 Contact with and (suspected) exposure to other viral communicable diseases: Secondary | ICD-10-CM | POA: Diagnosis not present

## 2019-07-21 DIAGNOSIS — Z01812 Encounter for preprocedural laboratory examination: Secondary | ICD-10-CM | POA: Diagnosis present

## 2019-07-21 LAB — HEMOGLOBIN A1C
Hgb A1c MFr Bld: 6.7 % — ABNORMAL HIGH (ref 4.8–5.6)
Mean Plasma Glucose: 146 mg/dL

## 2019-07-22 LAB — NOVEL CORONAVIRUS, NAA (HOSP ORDER, SEND-OUT TO REF LAB; TAT 18-24 HRS): SARS-CoV-2, NAA: NOT DETECTED

## 2019-07-24 MED ORDER — BUPIVACAINE LIPOSOME 1.3 % IJ SUSP
20.0000 mL | Freq: Once | INTRAMUSCULAR | Status: DC
  Filled 2019-07-24: qty 20

## 2019-07-24 MED ORDER — DEXTROSE 5 % IV SOLN
3.0000 g | INTRAVENOUS | Status: AC
  Administered 2019-07-25: 3 g via INTRAVENOUS
  Filled 2019-07-24: qty 3

## 2019-07-25 ENCOUNTER — Other Ambulatory Visit: Payer: Self-pay

## 2019-07-25 ENCOUNTER — Inpatient Hospital Stay (HOSPITAL_COMMUNITY): Payer: No Typology Code available for payment source | Admitting: Physician Assistant

## 2019-07-25 ENCOUNTER — Inpatient Hospital Stay (HOSPITAL_COMMUNITY)
Admission: RE | Admit: 2019-07-25 | Discharge: 2019-07-26 | DRG: 470 | Disposition: A | Discharge status: Home / Self Care | Payer: No Typology Code available for payment source | Attending: Orthopedic Surgery | Admitting: Orthopedic Surgery

## 2019-07-25 ENCOUNTER — Encounter (HOSPITAL_COMMUNITY): Payer: Self-pay | Admitting: *Deleted

## 2019-07-25 ENCOUNTER — Encounter (HOSPITAL_COMMUNITY)
Admission: RE | Disposition: A | Discharge status: Home / Self Care | Payer: Self-pay | Source: Home / Self Care | Attending: Orthopedic Surgery

## 2019-07-25 ENCOUNTER — Inpatient Hospital Stay (HOSPITAL_COMMUNITY): Payer: No Typology Code available for payment source | Admitting: Certified Registered Nurse Anesthetist

## 2019-07-25 DIAGNOSIS — M25761 Osteophyte, right knee: Secondary | ICD-10-CM | POA: Diagnosis present

## 2019-07-25 DIAGNOSIS — E785 Hyperlipidemia, unspecified: Secondary | ICD-10-CM | POA: Diagnosis present

## 2019-07-25 DIAGNOSIS — K08109 Complete loss of teeth, unspecified cause, unspecified class: Secondary | ICD-10-CM | POA: Diagnosis present

## 2019-07-25 DIAGNOSIS — M47816 Spondylosis without myelopathy or radiculopathy, lumbar region: Secondary | ICD-10-CM | POA: Diagnosis present

## 2019-07-25 DIAGNOSIS — F329 Major depressive disorder, single episode, unspecified: Secondary | ICD-10-CM | POA: Diagnosis present

## 2019-07-25 DIAGNOSIS — Z79899 Other long term (current) drug therapy: Secondary | ICD-10-CM | POA: Diagnosis not present

## 2019-07-25 DIAGNOSIS — Z8614 Personal history of Methicillin resistant Staphylococcus aureus infection: Secondary | ICD-10-CM | POA: Diagnosis not present

## 2019-07-25 DIAGNOSIS — I2510 Atherosclerotic heart disease of native coronary artery without angina pectoris: Secondary | ICD-10-CM | POA: Diagnosis present

## 2019-07-25 DIAGNOSIS — K449 Diaphragmatic hernia without obstruction or gangrene: Secondary | ICD-10-CM | POA: Diagnosis present

## 2019-07-25 DIAGNOSIS — K219 Gastro-esophageal reflux disease without esophagitis: Secondary | ICD-10-CM | POA: Diagnosis present

## 2019-07-25 DIAGNOSIS — Z9079 Acquired absence of other genital organ(s): Secondary | ICD-10-CM

## 2019-07-25 DIAGNOSIS — I10 Essential (primary) hypertension: Secondary | ICD-10-CM | POA: Diagnosis present

## 2019-07-25 DIAGNOSIS — Z90722 Acquired absence of ovaries, bilateral: Secondary | ICD-10-CM | POA: Diagnosis not present

## 2019-07-25 DIAGNOSIS — M1711 Unilateral primary osteoarthritis, right knee: Secondary | ICD-10-CM | POA: Diagnosis present

## 2019-07-25 DIAGNOSIS — Z91048 Other nonmedicinal substance allergy status: Secondary | ICD-10-CM | POA: Diagnosis not present

## 2019-07-25 DIAGNOSIS — M1710 Unilateral primary osteoarthritis, unspecified knee: Secondary | ICD-10-CM

## 2019-07-25 DIAGNOSIS — Z90710 Acquired absence of both cervix and uterus: Secondary | ICD-10-CM

## 2019-07-25 DIAGNOSIS — M179 Osteoarthritis of knee, unspecified: Secondary | ICD-10-CM | POA: Diagnosis present

## 2019-07-25 DIAGNOSIS — Z8249 Family history of ischemic heart disease and other diseases of the circulatory system: Secondary | ICD-10-CM | POA: Diagnosis not present

## 2019-07-25 DIAGNOSIS — Z6841 Body Mass Index (BMI) 40.0 and over, adult: Secondary | ICD-10-CM

## 2019-07-25 DIAGNOSIS — Z7982 Long term (current) use of aspirin: Secondary | ICD-10-CM

## 2019-07-25 HISTORY — PX: TOTAL KNEE ARTHROPLASTY: SHX125

## 2019-07-25 LAB — TYPE AND SCREEN
ABO/RH(D): A POS
Antibody Screen: NEGATIVE

## 2019-07-25 SURGERY — ARTHROPLASTY, KNEE, TOTAL
Anesthesia: Spinal | Laterality: Right

## 2019-07-25 MED ORDER — ONDANSETRON HCL 4 MG/2ML IJ SOLN
INTRAMUSCULAR | Status: DC | PRN
  Administered 2019-07-25: 4 mg via INTRAVENOUS

## 2019-07-25 MED ORDER — METHOCARBAMOL 500 MG PO TABS
500.0000 mg | ORAL_TABLET | Freq: Four times a day (QID) | ORAL | Status: DC | PRN
  Administered 2019-07-25 – 2019-07-26 (×4): 500 mg via ORAL
  Filled 2019-07-25 (×4): qty 1

## 2019-07-25 MED ORDER — BUPIVACAINE LIPOSOME 1.3 % IJ SUSP
INTRAMUSCULAR | Status: DC | PRN
  Administered 2019-07-25: 20 mL

## 2019-07-25 MED ORDER — SODIUM CHLORIDE 0.9 % IR SOLN
Status: DC | PRN
  Administered 2019-07-25: 1000 mL

## 2019-07-25 MED ORDER — ACETAMINOPHEN 10 MG/ML IV SOLN
1000.0000 mg | Freq: Four times a day (QID) | INTRAVENOUS | Status: DC
  Administered 2019-07-25: 1000 mg via INTRAVENOUS
  Filled 2019-07-25: qty 100

## 2019-07-25 MED ORDER — CEFAZOLIN SODIUM-DEXTROSE 2-4 GM/100ML-% IV SOLN
2.0000 g | Freq: Four times a day (QID) | INTRAVENOUS | Status: AC
  Administered 2019-07-25 (×2): 2 g via INTRAVENOUS
  Filled 2019-07-25 (×2): qty 100

## 2019-07-25 MED ORDER — MENTHOL 3 MG MT LOZG: 1.0000 | LOZENGE | OROMUCOSAL | Status: DC | PRN

## 2019-07-25 MED ORDER — HYDROXYZINE HCL 25 MG PO TABS: 25.0000 mg | ORAL_TABLET | Freq: Two times a day (BID) | ORAL | Status: DC | PRN

## 2019-07-25 MED ORDER — PROMETHAZINE HCL 25 MG/ML IJ SOLN: 6.2500 mg | INTRAMUSCULAR | Status: DC | PRN

## 2019-07-25 MED ORDER — ONDANSETRON HCL 4 MG PO TABS: 4.0000 mg | ORAL_TABLET | Freq: Four times a day (QID) | ORAL | Status: DC | PRN

## 2019-07-25 MED ORDER — STERILE WATER FOR IRRIGATION IR SOLN
Status: DC | PRN
  Administered 2019-07-25: 2000 mL

## 2019-07-25 MED ORDER — BUPIVACAINE IN DEXTROSE 0.75-8.25 % IT SOLN
INTRATHECAL | Status: DC | PRN
  Administered 2019-07-25: 1.6 mL via INTRATHECAL

## 2019-07-25 MED ORDER — HYDROMORPHONE HCL 1 MG/ML IJ SOLN: 0.2500 mg | INTRAMUSCULAR | Status: DC | PRN

## 2019-07-25 MED ORDER — ASPIRIN EC 325 MG PO TBEC
325.0000 mg | DELAYED_RELEASE_TABLET | Freq: Two times a day (BID) | ORAL | Status: DC
  Administered 2019-07-26: 325 mg via ORAL
  Filled 2019-07-25: qty 1

## 2019-07-25 MED ORDER — METOCLOPRAMIDE HCL 5 MG/ML IJ SOLN: 5.0000 mg | Freq: Three times a day (TID) | INTRAMUSCULAR | Status: DC | PRN

## 2019-07-25 MED ORDER — PANTOPRAZOLE SODIUM 40 MG PO TBEC
40.0000 mg | DELAYED_RELEASE_TABLET | Freq: Every day | ORAL | Status: DC
  Administered 2019-07-26: 40 mg via ORAL
  Filled 2019-07-25: qty 1

## 2019-07-25 MED ORDER — POVIDONE-IODINE 10 % EX SWAB
2.0000 "application " | Freq: Once | CUTANEOUS | Status: AC
  Administered 2019-07-25: 2 via TOPICAL

## 2019-07-25 MED ORDER — DEXAMETHASONE SODIUM PHOSPHATE 10 MG/ML IJ SOLN
8.0000 mg | Freq: Once | INTRAMUSCULAR | Status: AC
  Administered 2019-07-25: 8 mg via INTRAVENOUS

## 2019-07-25 MED ORDER — SERTRALINE HCL 50 MG PO TABS
150.0000 mg | ORAL_TABLET | Freq: Every day | ORAL | Status: DC
  Administered 2019-07-26: 50 mg via ORAL
  Filled 2019-07-25 (×2): qty 3

## 2019-07-25 MED ORDER — PROPOFOL 10 MG/ML IV BOLUS
INTRAVENOUS | Status: DC | PRN
  Administered 2019-07-25 (×6): 10 mg via INTRAVENOUS
  Administered 2019-07-25: 20 mg via INTRAVENOUS
  Administered 2019-07-25 (×3): 10 mg via INTRAVENOUS
  Administered 2019-07-25: 20 mg via INTRAVENOUS
  Administered 2019-07-25 (×2): 10 mg via INTRAVENOUS

## 2019-07-25 MED ORDER — LIDOCAINE 2% (20 MG/ML) 5 ML SYRINGE
INTRAMUSCULAR | Status: AC
  Filled 2019-07-25: qty 5

## 2019-07-25 MED ORDER — SODIUM CHLORIDE (PF) 0.9 % IJ SOLN
INTRAMUSCULAR | Status: AC
  Filled 2019-07-25: qty 50

## 2019-07-25 MED ORDER — LACTATED RINGERS IV SOLN
INTRAVENOUS | Status: DC
  Administered 2019-07-25 (×2): via INTRAVENOUS

## 2019-07-25 MED ORDER — DOCUSATE SODIUM 100 MG PO CAPS
100.0000 mg | ORAL_CAPSULE | Freq: Two times a day (BID) | ORAL | Status: DC
  Administered 2019-07-25 – 2019-07-26 (×2): 100 mg via ORAL
  Filled 2019-07-25 (×2): qty 1

## 2019-07-25 MED ORDER — ACETAMINOPHEN 325 MG PO TABS: 325.0000 mg | ORAL_TABLET | Freq: Once | ORAL | Status: DC | PRN

## 2019-07-25 MED ORDER — BUPROPION HCL ER (XL) 150 MG PO TB24
150.0000 mg | ORAL_TABLET | Freq: Three times a day (TID) | ORAL | Status: DC
  Administered 2019-07-25 – 2019-07-26 (×3): 150 mg via ORAL
  Filled 2019-07-25 (×3): qty 1

## 2019-07-25 MED ORDER — DEXAMETHASONE SODIUM PHOSPHATE 10 MG/ML IJ SOLN
10.0000 mg | Freq: Once | INTRAMUSCULAR | Status: AC
  Administered 2019-07-26: 10 mg via INTRAVENOUS
  Filled 2019-07-25: qty 1

## 2019-07-25 MED ORDER — ROPIVACAINE HCL 7.5 MG/ML IJ SOLN
INTRAMUSCULAR | Status: DC | PRN
  Administered 2019-07-25: 20 mL via PERINEURAL

## 2019-07-25 MED ORDER — PRAZOSIN HCL 1 MG PO CAPS
1.0000 mg | ORAL_CAPSULE | Freq: Every day | ORAL | Status: DC
  Administered 2019-07-25: 1 mg via ORAL
  Filled 2019-07-25 (×2): qty 1

## 2019-07-25 MED ORDER — CHLORHEXIDINE GLUCONATE 4 % EX LIQD: 60.0000 mL | Freq: Once | CUTANEOUS | Status: DC

## 2019-07-25 MED ORDER — PROPOFOL 10 MG/ML IV BOLUS
INTRAVENOUS | Status: AC
  Filled 2019-07-25: qty 20

## 2019-07-25 MED ORDER — MORPHINE SULFATE (PF) 2 MG/ML IV SOLN: 1.0000 mg | INTRAVENOUS | Status: DC | PRN

## 2019-07-25 MED ORDER — QUETIAPINE FUMARATE 50 MG PO TABS
50.0000 mg | ORAL_TABLET | Freq: Every day | ORAL | Status: DC
  Administered 2019-07-26: 50 mg via ORAL
  Filled 2019-07-25: qty 1

## 2019-07-25 MED ORDER — SODIUM CHLORIDE (PF) 0.9 % IJ SOLN
INTRAMUSCULAR | Status: AC
  Filled 2019-07-25: qty 10

## 2019-07-25 MED ORDER — AMLODIPINE BESYLATE 5 MG PO TABS
5.0000 mg | ORAL_TABLET | Freq: Every day | ORAL | Status: DC
  Administered 2019-07-26: 5 mg via ORAL
  Filled 2019-07-25: qty 1

## 2019-07-25 MED ORDER — ACETAMINOPHEN 10 MG/ML IV SOLN: 1000.0000 mg | Freq: Once | INTRAVENOUS | Status: DC | PRN

## 2019-07-25 MED ORDER — PROPOFOL 500 MG/50ML IV EMUL
INTRAVENOUS | Status: DC | PRN
  Administered 2019-07-25: 50 ug/kg/min via INTRAVENOUS

## 2019-07-25 MED ORDER — ACETAMINOPHEN 500 MG PO TABS
1000.0000 mg | ORAL_TABLET | Freq: Four times a day (QID) | ORAL | Status: AC
  Administered 2019-07-25 – 2019-07-26 (×4): 1000 mg via ORAL
  Filled 2019-07-25 (×4): qty 2

## 2019-07-25 MED ORDER — SODIUM CHLORIDE (PF) 0.9 % IJ SOLN
INTRAMUSCULAR | Status: DC | PRN
  Administered 2019-07-25: 60 mL

## 2019-07-25 MED ORDER — FLEET ENEMA 7-19 GM/118ML RE ENEM: 1.0000 | ENEMA | Freq: Once | RECTAL | Status: DC | PRN

## 2019-07-25 MED ORDER — QUETIAPINE FUMARATE 50 MG PO TABS
200.0000 mg | ORAL_TABLET | Freq: Every day | ORAL | Status: DC
  Administered 2019-07-25: 200 mg via ORAL
  Filled 2019-07-25: qty 4

## 2019-07-25 MED ORDER — BISACODYL 10 MG RE SUPP: 10.0000 mg | Freq: Every day | RECTAL | Status: DC | PRN

## 2019-07-25 MED ORDER — ATORVASTATIN CALCIUM 40 MG PO TABS
80.0000 mg | ORAL_TABLET | Freq: Every day | ORAL | Status: DC
  Filled 2019-07-25: qty 2

## 2019-07-25 MED ORDER — DIPHENHYDRAMINE HCL 12.5 MG/5ML PO ELIX: 12.5000 mg | ORAL_SOLUTION | ORAL | Status: DC | PRN

## 2019-07-25 MED ORDER — 0.9 % SODIUM CHLORIDE (POUR BTL) OPTIME
TOPICAL | Status: DC | PRN
  Administered 2019-07-25: 1000 mL

## 2019-07-25 MED ORDER — ACETAMINOPHEN 160 MG/5ML PO SOLN: 325.0000 mg | Freq: Once | ORAL | Status: DC | PRN

## 2019-07-25 MED ORDER — MIDAZOLAM HCL 2 MG/2ML IJ SOLN
1.0000 mg | INTRAMUSCULAR | Status: DC
  Administered 2019-07-25: 2 mg via INTRAVENOUS
  Filled 2019-07-25: qty 2

## 2019-07-25 MED ORDER — PHENYLEPHRINE HCL (PRESSORS) 10 MG/ML IV SOLN
INTRAVENOUS | Status: AC
  Filled 2019-07-25: qty 1

## 2019-07-25 MED ORDER — DEXAMETHASONE SODIUM PHOSPHATE 10 MG/ML IJ SOLN
INTRAMUSCULAR | Status: AC
  Filled 2019-07-25: qty 1

## 2019-07-25 MED ORDER — METOCLOPRAMIDE HCL 5 MG PO TABS: 5.0000 mg | ORAL_TABLET | Freq: Three times a day (TID) | ORAL | Status: DC | PRN

## 2019-07-25 MED ORDER — PROPOFOL 10 MG/ML IV BOLUS
INTRAVENOUS | Status: AC
  Filled 2019-07-25: qty 60

## 2019-07-25 MED ORDER — PHENOL 1.4 % MT LIQD
1.0000 | OROMUCOSAL | Status: DC | PRN
  Filled 2019-07-25: qty 177

## 2019-07-25 MED ORDER — OXYCODONE HCL 5 MG PO TABS
5.0000 mg | ORAL_TABLET | ORAL | Status: DC | PRN
  Administered 2019-07-25 – 2019-07-26 (×3): 5 mg via ORAL
  Filled 2019-07-25 (×3): qty 1

## 2019-07-25 MED ORDER — MEPERIDINE HCL 50 MG/ML IJ SOLN: 6.2500 mg | INTRAMUSCULAR | Status: DC | PRN

## 2019-07-25 MED ORDER — LACTATED RINGERS IV SOLN: INTRAVENOUS | Status: DC

## 2019-07-25 MED ORDER — SUCRALFATE 1 G PO TABS: 1.0000 g | ORAL_TABLET | Freq: Four times a day (QID) | ORAL | Status: DC | PRN

## 2019-07-25 MED ORDER — POLYETHYLENE GLYCOL 3350 17 G PO PACK: 17.0000 g | PACK | Freq: Every day | ORAL | Status: DC | PRN

## 2019-07-25 MED ORDER — OXYCODONE HCL 5 MG PO TABS: 10.0000 mg | ORAL_TABLET | ORAL | Status: DC | PRN

## 2019-07-25 MED ORDER — FENTANYL CITRATE (PF) 100 MCG/2ML IJ SOLN
50.0000 ug | INTRAMUSCULAR | Status: DC
  Administered 2019-07-25: 50 ug via INTRAVENOUS
  Filled 2019-07-25: qty 2

## 2019-07-25 MED ORDER — GABAPENTIN 300 MG PO CAPS
300.0000 mg | ORAL_CAPSULE | Freq: Two times a day (BID) | ORAL | Status: DC
  Administered 2019-07-25 – 2019-07-26 (×3): 300 mg via ORAL
  Filled 2019-07-25 (×3): qty 1

## 2019-07-25 MED ORDER — ONDANSETRON HCL 4 MG/2ML IJ SOLN
INTRAMUSCULAR | Status: AC
  Filled 2019-07-25: qty 2

## 2019-07-25 MED ORDER — SODIUM CHLORIDE 0.9 % IV SOLN
INTRAVENOUS | Status: DC
  Administered 2019-07-25: 14:00:00 via INTRAVENOUS

## 2019-07-25 MED ORDER — ONDANSETRON HCL 4 MG/2ML IJ SOLN: 4.0000 mg | Freq: Four times a day (QID) | INTRAMUSCULAR | Status: DC | PRN

## 2019-07-25 MED ORDER — PHENYLEPHRINE 40 MCG/ML (10ML) SYRINGE FOR IV PUSH (FOR BLOOD PRESSURE SUPPORT)
PREFILLED_SYRINGE | INTRAVENOUS | Status: AC
  Filled 2019-07-25: qty 10

## 2019-07-25 MED ORDER — METHOCARBAMOL 500 MG IVPB - SIMPLE MED
500.0000 mg | Freq: Four times a day (QID) | INTRAVENOUS | Status: DC | PRN
  Filled 2019-07-25: qty 50

## 2019-07-25 MED ORDER — TRANEXAMIC ACID-NACL 1000-0.7 MG/100ML-% IV SOLN
1000.0000 mg | INTRAVENOUS | Status: AC
  Administered 2019-07-25: 12:00:00 1000 mg via INTRAVENOUS
  Filled 2019-07-25: qty 100

## 2019-07-25 SURGICAL SUPPLY — 62 items
ATTUNE MED DOME PAT 38 KNEE (Knees) ×1 IMPLANT
ATTUNE PSFEM RTSZ6 NARCEM KNEE (Femur) ×1 IMPLANT
ATTUNE PSRP INSR SZ6 8 KNEE (Insert) ×1 IMPLANT
BAG SPEC THK2 15X12 ZIP CLS (MISCELLANEOUS) ×1
BAG ZIPLOCK 12X15 (MISCELLANEOUS) ×2 IMPLANT
BASE TIBIAL ROT PLAT SZ 5 KNEE (Knees) IMPLANT
BLADE SAG 18X100X1.27 (BLADE) ×2 IMPLANT
BLADE SAW SGTL 11.0X1.19X90.0M (BLADE) ×2 IMPLANT
BLADE SURG SZ10 CARB STEEL (BLADE) ×4 IMPLANT
BNDG CMPR MED 15X6 ELC VLCR LF (GAUZE/BANDAGES/DRESSINGS) ×1
BNDG ELASTIC 6X15 VLCR STRL LF (GAUZE/BANDAGES/DRESSINGS) ×1 IMPLANT
BNDG ELASTIC 6X5.8 VLCR STR LF (GAUZE/BANDAGES/DRESSINGS) ×2 IMPLANT
BOWL SMART MIX CTS (DISPOSABLE) ×2 IMPLANT
BSPLAT TIB 5 CMNT ROT PLAT STR (Knees) ×1 IMPLANT
CEMENT HV SMART SET (Cement) ×4 IMPLANT
COVER SURGICAL LIGHT HANDLE (MISCELLANEOUS) ×2 IMPLANT
COVER WAND RF STERILE (DRAPES) IMPLANT
CUFF TOURN SGL QUICK 34 (TOURNIQUET CUFF) ×2
CUFF TRNQT CYL 34X4.125X (TOURNIQUET CUFF) ×1 IMPLANT
DECANTER SPIKE VIAL GLASS SM (MISCELLANEOUS) ×2 IMPLANT
DRAPE U-SHAPE 47X51 STRL (DRAPES) ×2 IMPLANT
DRSG ADAPTIC 3X8 NADH LF (GAUZE/BANDAGES/DRESSINGS) ×2 IMPLANT
DRSG PAD ABDOMINAL 8X10 ST (GAUZE/BANDAGES/DRESSINGS) ×2 IMPLANT
DURAPREP 26ML APPLICATOR (WOUND CARE) ×2 IMPLANT
ELECT REM PT RETURN 15FT ADLT (MISCELLANEOUS) ×2 IMPLANT
EVACUATOR 1/8 PVC DRAIN (DRAIN) ×2 IMPLANT
GAUZE SPONGE 4X4 12PLY STRL (GAUZE/BANDAGES/DRESSINGS) ×2 IMPLANT
GLOVE BIO SURGEON STRL SZ7 (GLOVE) ×2 IMPLANT
GLOVE BIO SURGEON STRL SZ8 (GLOVE) ×2 IMPLANT
GLOVE BIOGEL PI IND STRL 6.5 (GLOVE) ×1 IMPLANT
GLOVE BIOGEL PI IND STRL 7.0 (GLOVE) ×1 IMPLANT
GLOVE BIOGEL PI IND STRL 8 (GLOVE) ×1 IMPLANT
GLOVE BIOGEL PI INDICATOR 6.5 (GLOVE) ×1
GLOVE BIOGEL PI INDICATOR 7.0 (GLOVE) ×1
GLOVE BIOGEL PI INDICATOR 8 (GLOVE) ×1
GLOVE SURG SS PI 6.5 STRL IVOR (GLOVE) ×2 IMPLANT
GOWN STRL REUS W/TWL LRG LVL3 (GOWN DISPOSABLE) ×6 IMPLANT
HANDPIECE INTERPULSE COAX TIP (DISPOSABLE) ×2
HOLDER FOLEY CATH W/STRAP (MISCELLANEOUS) ×1 IMPLANT
IMMOBILIZER KNEE 20 (SOFTGOODS) ×2
IMMOBILIZER KNEE 20 THIGH 36 (SOFTGOODS) ×1 IMPLANT
KIT TURNOVER KIT A (KITS) IMPLANT
MANIFOLD NEPTUNE II (INSTRUMENTS) ×2 IMPLANT
NS IRRIG 1000ML POUR BTL (IV SOLUTION) ×2 IMPLANT
PACK TOTAL KNEE CUSTOM (KITS) ×2 IMPLANT
PADDING CAST COTTON 6X4 STRL (CAST SUPPLIES) ×4 IMPLANT
PIN DRILL FIX HALF THREAD (BIT) ×1 IMPLANT
PIN STEINMAN FIXATION KNEE (PIN) ×1 IMPLANT
PROTECTOR NERVE ULNAR (MISCELLANEOUS) ×2 IMPLANT
SET HNDPC FAN SPRY TIP SCT (DISPOSABLE) ×1 IMPLANT
STRIP CLOSURE SKIN 1/2X4 (GAUZE/BANDAGES/DRESSINGS) ×4 IMPLANT
SUT MNCRL AB 4-0 PS2 18 (SUTURE) ×2 IMPLANT
SUT STRATAFIX 0 PDS 27 VIOLET (SUTURE) ×2
SUT VIC AB 2-0 CT1 27 (SUTURE) ×6
SUT VIC AB 2-0 CT1 TAPERPNT 27 (SUTURE) ×3 IMPLANT
SUTURE STRATFX 0 PDS 27 VIOLET (SUTURE) ×1 IMPLANT
TAPE STRIPS DRAPE STRL (GAUZE/BANDAGES/DRESSINGS) ×1 IMPLANT
TIBIAL BASE ROT PLAT SZ 5 KNEE (Knees) ×2 IMPLANT
TRAY FOLEY MTR SLVR 16FR STAT (SET/KITS/TRAYS/PACK) ×2 IMPLANT
WATER STERILE IRR 1000ML POUR (IV SOLUTION) ×4 IMPLANT
WRAP KNEE MAXI GEL POST OP (GAUZE/BANDAGES/DRESSINGS) ×2 IMPLANT
YANKAUER SUCT BULB TIP 10FT TU (MISCELLANEOUS) ×2 IMPLANT

## 2019-07-25 NOTE — Anesthesia Procedure Notes (Signed)
Anesthesia Regional Block: Adductor canal block   Pre-Anesthetic Checklist: ,, timeout performed, Correct Patient, Correct Site, Correct Laterality, Correct Procedure, Correct Position, site marked, Risks and benefits discussed,  Surgical consent,  Pre-op evaluation,  At surgeon's request and post-op pain management  Laterality: Right  Prep: chloraprep       Needles:  Injection technique: Single-shot  Needle Type: Echogenic Stimulator Needle     Needle Length: 9cm  Needle Gauge: 21     Additional Needles:   Procedures:,,,, ultrasound used (permanent image in chart),,,,  Narrative:  Start time: 07/25/2019 10:20 AM End time: 07/25/2019 10:30 AM Injection made incrementally with aspirations every 5 mL.  Performed by: Personally  Anesthesiologist: Effie Berkshire, MD  Additional Notes: Patient tolerated the procedure well. Local anesthetic introduced in an incremental fashion under minimal resistance after negative aspirations. No paresthesias were elicited. After completion of the procedure, no acute issues were identified and patient continued to be monitored by RN.

## 2019-07-25 NOTE — Transfer of Care (Signed)
Immediate Anesthesia Transfer of Care Note  Patient: ANICKA GOWEN  Procedure(s) Performed: TOTAL KNEE ARTHROPLASTY (Right )  Patient Location: PACU  Anesthesia Type:Spinal  Level of Consciousness: drowsy and patient cooperative  Airway & Oxygen Therapy: Patient Spontanous Breathing and Patient connected to face mask oxygen  Post-op Assessment: Report given to RN and Post -op Vital signs reviewed and stable  Post vital signs: Reviewed and stable  Last Vitals:  Vitals Value Taken Time  BP 113/79 07/25/19 1310  Temp    Pulse 79 07/25/19 1311  Resp    SpO2 99 % 07/25/19 1311  Vitals shown include unvalidated device data.  Last Pain:  Vitals:   07/25/19 0942  TempSrc:   PainSc: 8       Patients Stated Pain Goal: 6 ([id number])  Complications: No apparent anesthesia complications

## 2019-07-25 NOTE — Op Note (Signed)
OPERATIVE REPORT-TOTAL KNEE ARTHROPLASTY   Pre-operative diagnosis- Osteoarthritis  Right knee(s)  Post-operative diagnosis- Osteoarthritis Right knee(s)  Procedure-  Right  Total Knee Arthroplasty (Depuy Attune)  Surgeon- Dione Plover. Yordin Rhoda, MD  Assistant- Molli Barrows, PA-C   Anesthesia-  Adductor canal block and spinal  EBL- 25 ml   Drains Hemovac  Tourniquet time- 40 minutes @ [id number] mm Hg  Complications- None  Condition-PACU - hemodynamically stable.   Brief Clinical Note  [name] is a 58 y.o. year old female with end stage OA of her right knee with progressively worsening pain and dysfunction. She has constant pain, with activity and at rest and significant functional deficits with difficulties even with ADLs. She has had extensive non-op management including analgesics, injections of cortisone and viscosupplements, and home exercise program, but remains in significant pain with significant dysfunction.Radiographs show bone on bone arthritis medial. She presents now for right Total Knee Arthroplasty.    Procedure in detail---   The patient is brought into the operating room and positioned supine on the operating table. After successful administration of  Adductor canal block and spinal,   a tourniquet is placed high on the  Right thigh(s) and the lower extremity is prepped and draped in the usual sterile fashion. Time out is performed by the operating team and then the  Right lower extremity is wrapped in Esmarch, knee flexed and the tourniquet inflated to 300 mmHg.       A midline incision is made with a ten blade through the subcutaneous tissue to the level of the extensor mechanism. A fresh blade is used to make a medial parapatellar arthrotomy. Soft tissue over the proximal medial tibia is subperiosteally elevated to the joint line with a knife and into the semimembranosus bursa with a Cobb elevator. Soft tissue over the proximal lateral tibia is elevated with attention  being paid to avoiding the patellar tendon on the tibial tubercle. The patella is everted, knee flexed 90 degrees and the ACL and PCL are removed. Findings are exposed bone medial and patellofemoral compartments with large osteophytes.        The drill is used to create a starting hole in the distal femur and the canal is thoroughly irrigated with sterile saline to remove the fatty contents. The 5 degree Right  valgus alignment guide is placed into the femoral canal and the distal femoral cutting block is pinned to remove 9 mm off the distal femur. Resection is made with an oscillating saw.      The tibia is subluxed forward and the menisci are removed. The extramedullary alignment guide is placed referencing proximally at the medial aspect of the tibial tubercle and distally along the second metatarsal axis and tibial crest. The block is pinned to remove 55mm off the more deficient medial  side. Resection is made with an oscillating saw. Size 5is the most appropriate size for the tibia and the proximal tibia is prepared with the modular drill and keel punch for that size.      The femoral sizing guide is placed and size 6 is most appropriate. Rotation is marked off the epicondylar axis and confirmed by creating a rectangular flexion gap at 90 degrees. The size 6 cutting block is pinned in this rotation and the anterior, posterior and chamfer cuts are made with the oscillating saw. The intercondylar block is then placed and that cut is made.      Trial size 5 tibial component, trial size 6 narrow posterior  stabilized femur and a 8  mm posterior stabilized rotating platform insert trial is placed. Full extension is achieved with excellent varus/valgus and anterior/posterior balance throughout full range of motion. The patella is everted and thickness measured to be 22  mm. Free hand resection is taken to 12 mm, a 38 template is placed, lug holes are drilled, trial patella is placed, and it tracks normally.  Osteophytes are removed off the posterior femur with the trial in place. All trials are removed and the cut bone surfaces prepared with pulsatile lavage. Cement is mixed and once ready for implantation, the size 5 tibial implant, size  6 narrow posterior stabilized femoral component, and the size 38 patella are cemented in place and the patella is held with the clamp. The trial insert is placed and the knee held in full extension. The Exparel (20 ml mixed with 60 ml saline) is injected into the extensor mechanism, posterior capsule, medial and lateral gutters and subcutaneous tissues.  All extruded cement is removed and once the cement is hard the permanent 8 mm posterior stabilized rotating platform insert is placed into the tibial tray.      The wound is copiously irrigated with saline solution and the extensor mechanism closed over a hemovac drain with #1 V-loc suture. The tourniquet is released for a total tourniquet time of 40  minutes. Flexion against gravity is 140 degrees and the patella tracks normally. Subcutaneous tissue is closed with 2.0 vicryl and subcuticular with running 4.0 Monocryl. The incision is cleaned and dried and steri-strips and a bulky sterile dressing are applied. The limb is placed into a knee immobilizer and the patient is awakened and transported to recovery in stable condition.      Please note that a surgical assistant was a medical necessity for this procedure in order to perform it in a safe and expeditious manner. Surgical assistant was necessary to retract the ligaments and vital neurovascular structures to prevent injury to them and also necessary for proper positioning of the limb to allow for anatomic placement of the prosthesis.   Dione Plover Latavious Bitter, MD    07/25/2019, 12:39 PM

## 2019-07-25 NOTE — Discharge Instructions (Signed)
° °Dr. Frank Aluisio °Total Joint Specialist °[facility] °[address] °[place], [place] [address] °[phone] ° °TOTAL KNEE REPLACEMENT POSTOPERATIVE DIRECTIONS ° °Knee Rehabilitation, Guidelines Following Surgery  °Results after knee surgery are often greatly improved when you follow the exercise, range of motion and muscle strengthening exercises prescribed by your doctor. Safety measures are also important to protect the knee from further injury. Any time any of these exercises cause you to have increased pain or swelling in your knee joint, decrease the amount until you are comfortable again and slowly increase them. If you have problems or questions, call your caregiver or physical therapist for advice.  ° °HOME CARE INSTRUCTIONS  °• Remove items at home which could result in a fall. This includes throw rugs or furniture in walking pathways.  °· ICE to the affected knee every three hours for 30 minutes at a time and then as needed for pain and swelling.  Continue to use ice on the knee for pain and swelling from surgery. You may notice swelling that will progress down to the foot and ankle.  This is normal after surgery.  Elevate the leg when you are not up walking on it.   °· Continue to use the breathing machine which will help keep your temperature down.  It is common for your temperature to cycle up and down following surgery, especially at night when you are not up moving around and exerting yourself.  The breathing machine keeps your lungs expanded and your temperature down. °· Do not place pillow under knee, focus on keeping the knee straight while resting ° °DIET °You may resume your previous home diet once your are discharged from the hospital. ° °DRESSING / WOUND CARE / SHOWERING °You may shower 3 days after surgery, but keep the wounds dry during showering.  You may use an occlusive plastic wrap (Press'n Seal for example), NO SOAKING/SUBMERGING IN THE BATHTUB.  If the bandage  gets wet, change with a clean dry gauze.  If the incision gets wet, pat the wound dry with a clean towel. °You may start showering once you are discharged home but do not submerge the incision under water. Just pat the incision dry and apply a dry gauze dressing on daily. °Change the surgical dressing daily and reapply a dry dressing each time. ° °ACTIVITY °Walk with your walker as instructed. °Use walker as long as suggested by your caregivers. °Avoid periods of inactivity such as sitting longer than an hour when not asleep. This helps prevent blood clots.  °You may resume a sexual relationship in one month or when given the OK by your doctor.  °You may return to work once you are cleared by your doctor.  °Do not drive a car for 6 weeks or until released by you surgeon.  °Do not drive while taking narcotics. ° °WEIGHT BEARING °Weight bearing as tolerated with assist device (walker, cane, etc) as directed, use it as long as suggested by your surgeon or therapist, typically at least 4-6 weeks. ° °POSTOPERATIVE CONSTIPATION PROTOCOL °Constipation - defined medically as fewer than three stools per week and severe constipation as less than one stool per week. ° °One of the most common issues patients have following surgery is constipation.  Even if you have a regular bowel pattern at home, your normal regimen is likely to be disrupted due to multiple reasons following surgery.  Combination of anesthesia, postoperative narcotics, change in appetite and fluid intake all can affect your bowels.    In order to avoid complications following surgery, here are some recommendations in order to help you during your recovery period. ° °Colace (docusate) - Pick up an over-the-counter form of Colace or another stool softener and take twice a day as long as you are requiring postoperative pain medications.  Take with a full glass of water daily.  If you experience loose stools or diarrhea, hold the colace until you stool forms back  up.  If your symptoms do not get better within 1 week or if they get worse, check with your doctor. ° °Dulcolax (bisacodyl) - Pick up over-the-counter and take as directed by the product packaging as needed to assist with the movement of your bowels.  Take with a full glass of water.  Use this product as needed if not relieved by Colace only.  ° °MiraLax (polyethylene glycol) - Pick up over-the-counter to have on hand.  MiraLax is a solution that will increase the amount of water in your bowels to assist with bowel movements.  Take as directed and can mix with a glass of water, juice, soda, coffee, or tea.  Take if you go more than two days without a movement. °Do not use MiraLax more than once per day. Call your doctor if you are still constipated or irregular after using this medication for 7 days in a row. ° °If you continue to have problems with postoperative constipation, please contact the office for further assistance and recommendations.  If you experience "the worst abdominal pain ever" or develop nausea or vomiting, please contact the office immediatly for further recommendations for treatment. ° °ITCHING ° If you experience itching with your medications, try taking only a single pain pill, or even half a pain pill at a time.  You can also use Benadryl over the counter for itching or also to help with sleep.  ° °TED HOSE STOCKINGS °Wear the elastic stockings on both legs for three weeks following surgery during the day but you may remove then at night for sleeping. ° °MEDICATIONS °See your medication summary on the “After Visit Summary” that the nursing staff will review with you prior to discharge.  You may have some home medications which will be placed on hold until you complete the course of blood thinner medication.  It is important for you to complete the blood thinner medication as prescribed by your surgeon.  Continue your approved medications as instructed at time of discharge. ° °PRECAUTIONS °If  you experience chest pain or shortness of breath - call 911 immediately for transfer to the hospital emergency department.  °If you develop a fever greater that 101 F, purulent drainage from wound, increased redness or drainage from wound, foul odor from the wound/dressing, or calf pain - CONTACT YOUR SURGEON.   °                                                °FOLLOW-UP APPOINTMENTS °Make sure you keep all of your appointments after your operation with your surgeon and caregivers. You should call the office at the above phone number and make an appointment for approximately two weeks after the date of your surgery or on the date instructed by your surgeon outlined in the "After Visit Summary". ° ° °RANGE OF MOTION AND STRENGTHENING EXERCISES  °Rehabilitation of the knee is important following a knee injury or   an operation. After just a few days of immobilization, the muscles of the thigh which control the knee become weakened and shrink (atrophy). Knee exercises are designed to build up the tone and strength of the thigh muscles and to improve knee motion. Often times heat used for twenty to thirty minutes before working out will loosen up your tissues and help with improving the range of motion but do not use heat for the first two weeks following surgery. These exercises can be done on a training (exercise) mat, on the floor, on a table or on a bed. Use what ever works the best and is most comfortable for you Knee exercises include:  °• Leg Lifts - While your knee is still immobilized in a splint or cast, you can do straight leg raises. Lift the leg to 60 degrees, hold for 3 sec, and slowly lower the leg. Repeat 10-20 times 2-3 times daily. Perform this exercise against resistance later as your knee gets better.  °• Quad and Hamstring Sets - Tighten up the muscle on the front of the thigh (Quad) and hold for 5-10 sec. Repeat this 10-20 times hourly. Hamstring sets are done by pushing the foot backward against an  object and holding for 5-10 sec. Repeat as with quad sets.  °· Leg Slides: Lying on your back, slowly slide your foot toward your buttocks, bending your knee up off the floor (only go as far as is comfortable). Then slowly slide your foot back down until your leg is flat on the floor again. °· Angel Wings: Lying on your back spread your legs to the side as far apart as you can without causing discomfort.  °A rehabilitation program following serious knee injuries can speed recovery and prevent re-injury in the future due to weakened muscles. Contact your doctor or a physical therapist for more information on knee rehabilitation.  ° °IF YOU ARE TRANSFERRED TO A SKILLED REHAB FACILITY °If the patient is transferred to a skilled rehab facility following release from the hospital, a list of the current medications will be sent to the facility for the patient to continue.  When discharged from the skilled rehab facility, please have the facility set up the patient's Home Health Physical Therapy prior to being released. Also, the skilled facility will be responsible for providing the patient with their medications at time of release from the facility to include their pain medication, the muscle relaxants, and their blood thinner medication. If the patient is still at the rehab facility at time of the two week follow up appointment, the skilled rehab facility will also need to assist the patient in arranging follow up appointment in our office and any transportation needs. ° °MAKE SURE YOU:  °• Understand these instructions.  °• Get help right away if you are not doing well or get worse.  ° ° °Pick up stool softner and laxative for home use following surgery while on pain medications. °Do not submerge incision under water. °Please use good hand washing techniques while changing dressing each day. °May shower starting three days after surgery. °Please use a clean towel to pat the incision dry following showers. °Continue to  use ice for pain and swelling after surgery. °Do not use any lotions or creams on the incision until instructed by your surgeon. ° °

## 2019-07-25 NOTE — Anesthesia Preprocedure Evaluation (Addendum)
Anesthesia Evaluation  Patient identified by MRN, date of birth, ID band Patient awake    Reviewed: Allergy & Precautions, NPO status , Patient's Chart, lab work & pertinent test results  Airway Mallampati: II  TM Distance: >3 FB Neck ROM: Full    Dental  (+) Edentulous Upper, Edentulous Lower   Pulmonary neg pulmonary ROS,    breath sounds clear to auscultation       Cardiovascular hypertension, Pt. on medications + CAD   Rhythm:Regular Rate:Normal     Neuro/Psych Depression negative neurological ROS     GI/Hepatic Neg liver ROS, hiatal hernia, GERD  Medicated,  Endo/Other  negative endocrine ROS  Renal/GU negative Renal ROS     Musculoskeletal  (+) Arthritis ,   Abdominal (+) + obese,   Peds  Hematology negative hematology ROS (+)   Anesthesia Other Findings   Reproductive/Obstetrics                            Lab Results  Component Value Date   WBC 4.2 07/20/2019   HGB 12.7 07/20/2019   HCT 40.4 07/20/2019   MCV 89.0 07/20/2019   PLT 263 07/20/2019   Lab Results  Component Value Date   CREATININE 1.06 (H) 07/20/2019   BUN 15 07/20/2019   NA 141 07/20/2019   K 3.4 (L) 07/20/2019   CL 106 07/20/2019   CO2 26 07/20/2019   Lab Results  Component Value Date   INR 1.0 07/20/2019   INR 1.0 01/20/2008     Anesthesia Physical Anesthesia Plan  ASA: II  Anesthesia Plan: Spinal   Post-op Pain Management:  Regional for Post-op pain   Induction: Intravenous  PONV Risk Score and Plan:   Airway Management Planned: Natural Airway and Simple Face Mask  Additional Equipment: None  Intra-op Plan:   Post-operative Plan:   Informed Consent: I have reviewed the patients History and Physical, chart, labs and discussed the procedure including the risks, benefits and alternatives for the proposed anesthesia with the patient or authorized representative who has indicated his/her  understanding and acceptance.       Plan Discussed with: CRNA  Anesthesia Plan Comments:        Anesthesia Quick Evaluation

## 2019-07-25 NOTE — Evaluation (Addendum)
Physical Therapy Evaluation Patient Details Name: [name] MRN: [id number] DOB: Apr 19, 1961 Today's Date: 07/25/2019   History of Present Illness  Patient is 58 y.o. female s/p Rt TKA on 07/25/19 with PMH significant for HTN, CAD, GERD, OA, depression, and previosu Lt shoulder surgery and bil carpal tunnel release.    Clinical Impression  [name] is a 58 y.o. female POD 0 s/p Rt TKA. Patient reports modified independence with mobility at baseline using SPC to ambulate. Patient is now limited by functional impairments (see PT problem list below) and requires min assist for transfers and gait with RW. Patient was able to ambulate ~60 feet with RW and min assist with blocking to facilitate Rt knee extension in stance phase by therapist. Patient instructed in exercise to facilitate ROM and circulation to manage edema. Patient will benefit from continued skilled PT interventions to address impairments and progress towards PLOF. Acute PT will follow to progress mobility and stair training in preparation for safe discharge home.     Follow Up Recommendations Follow surgeon's recommendation for DC plan and follow-up therapies    Equipment Recommendations  None recommended by PT    Recommendations for Other Services       Precautions / Restrictions Precautions Precautions: Fall Restrictions Weight Bearing Restrictions: No      Mobility  Bed Mobility Overal bed mobility: Needs Assistance Bed Mobility: Supine to Sit     Supine to sit: Min assist;HOB elevated     General bed mobility comments: pt using bed rails, HOB slightly elevated, min assist required for cues to seqeunce full transfer to sit EOB, assist for Rt LE mobility  Transfers Overall transfer level: Needs assistance Equipment used: Rolling walker (2 wheeled) Transfers: Sit to/from Stand Sit to Stand: Min assist         General transfer comment: cues for hand palcement and technique, min assist to initiate  power up from EOB  Ambulation/Gait Ambulation/Gait assistance: Min assist Gait Distance (Feet): 60 Feet Assistive device: Rolling walker (2 wheeled) Gait Pattern/deviations: Step-to pattern;Decreased step length - left;Decreased stance time - right;Decreased stride length Gait velocity: decreased   General Gait Details: pt reliant on UE support to unweight Rt LE to prevent knee buckling, 1 rest break required for UE's, therapist facilitate knee extension on Rt LE durign stance phase of gait, cues for RW management to improve pt's position to [employer]    Modified Rankin (Stroke Patients Only)       Balance Overall balance assessment: Needs assistance Sitting-balance support: No upper extremity supported;Feet supported Sitting balance-Leahy Scale: Good     Standing balance support: During functional activity;Bilateral upper extremity supported Standing balance-Leahy Scale: Poor Standing balance comment: UE support required for all standing            Pertinent Vitals/Pain Pain Assessment: No/denies pain    Home Living Family/patient expects to be discharged to:: Private residence Living Arrangements: Other relatives(pt lives with her sister [name]) Available Help at Discharge: Family Type of Home: House Home Access: Stairs to enter Entrance Stairs-Rails: Left Entrance Stairs-Number of Steps: wtih Lt hand rail going up Home Layout: Two level;Bed/bath upstairs Home Equipment: Walker - 2 wheels;Bedside commode;Cane - single point(she is supposed to be getting a RW from the [place]; sister is checking if they have a shower seat) Additional Comments: pt could sleep on couch if has to stay on main floor but this would  bother her back and be more difficult to get up from than her bed upstairs    Prior Function Level of Independence: Independent with assistive device(s)         Comments: pt ambulated with [facility] prior to surgery, reports her  distance with walking was limited by pain and if she went to food store or to walk somewhere far she used the electric scooters available     Hand Dominance        Extremity/Trunk Assessment   Upper Extremity Assessment Upper Extremity Assessment: Overall WFL for tasks assessed    Lower Extremity Assessment Lower Extremity Assessment: RLE deficits/detail RLE Deficits / Details: pt with good quad set in supine, light touch appeared intact at the posteriolateral hip and in the plantar surface of Rt foot, pt with Rt knee buckling in WB but able to [employer] LE using UE's on RW       Communication   Communication: No difficulties  Cognition Arousal/Alertness: Awake/alert Behavior During Therapy: WFL for tasks assessed/performed Overall Cognitive Status: Within Functional Limits for tasks assessed             General Comments      Exercises Total Joint Exercises Ankle Circles/Pumps: AROM;10 reps;Seated;Both Quad Sets: AROM;10 reps;Seated;Supine(2 sets (1x 5 supine, 1x 5 seated))   Assessment/Plan    PT Assessment Patient needs continued PT services  PT Problem List Decreased strength;Decreased balance;Decreased range of motion;Decreased mobility;Decreased knowledge of use of DME;Decreased activity tolerance       PT Treatment Interventions DME instruction;Functional mobility training;Patient/family education;Balance training;Modalities;Gait training;Therapeutic activities;Stair training;Therapeutic exercise    PT Goals (Current goals can be found in the Care Plan section)  Acute Rehab PT Goals Patient Stated Goal: to return home and walk with less pain PT Goal Formulation: With patient Time For Goal Achievement: 08/01/19 Potential to Achieve Goals: Good    Frequency 7X/week    AM-PAC PT "6 Clicks" Mobility  Outcome Measure Help needed turning from your back to your side while in a flat bed without using bedrails?: A Little Help needed moving from lying on your  back to sitting on the side of a flat bed without using bedrails?: A Little Help needed moving to and from a bed to a chair (including a wheelchair)?: A Little Help needed standing up from a chair using your arms (e.g., wheelchair or bedside chair)?: A Little Help needed to walk in hospital room?: A Little Help needed climbing 3-5 steps with a railing? : A Lot 6 Click Score: 17    End of Session Equipment Utilized During Treatment: Gait belt Activity Tolerance: Patient tolerated treatment well Patient left: in chair;with call bell/phone within reach;with chair alarm set(posey box out of batteries, RN notified) Nurse Communication: Mobility status PT Visit Diagnosis: Unsteadiness on feet (R26.81);Other abnormalities of gait and mobility (R26.89);Muscle weakness (generalized) (M62.81);Difficulty in walking, not elsewhere classified (R26.2)    Time: [id number] PT Time Calculation (min) (ACUTE ONLY): 34 min   Charges:   PT Evaluation $PT Eval Low Complexity: 1 Low PT Treatments $Gait Training: 8-22 mins       Kipp Brood, PT, DPT, [facility] Physical Therapist with [facility]  07/25/2019 5:37 PM

## 2019-07-25 NOTE — Anesthesia Postprocedure Evaluation (Signed)
Anesthesia Post Note  Patient: [name]  Procedure(s) Performed: TOTAL KNEE ARTHROPLASTY (Right )     Patient location during evaluation: PACU Anesthesia Type: Spinal Level of consciousness: oriented and awake and alert Pain management: pain level controlled Vital Signs Assessment: post-procedure vital signs reviewed and stable Respiratory status: spontaneous breathing, respiratory function stable and patient connected to nasal cannula oxygen Cardiovascular status: blood pressure returned to baseline and stable Postop Assessment: no headache, no backache, no apparent nausea or vomiting and spinal receding Anesthetic complications: no    Last Vitals:  Vitals:   07/25/19 1417 07/25/19 1534  BP: 118/77 134/80  Pulse: 62 70  Resp: 15 15  Temp: 36.5 C 36.5 C  SpO2: 98% 100%    Last Pain:  Vitals:   07/25/19 1417  TempSrc: Oral  PainSc: 0-No pain                 Effie Berkshire

## 2019-07-25 NOTE — Anesthesia Procedure Notes (Signed)
Spinal  Start time: 07/25/2019 11:30 AM End time: 07/25/2019 11:35 AM Staffing Anesthesiologist: Effie Berkshire, MD Performed: anesthesiologist  Preanesthetic Checklist Completed: patient identified, site marked, surgical consent, pre-op evaluation, timeout performed, IV checked, risks and benefits discussed and monitors and equipment checked Spinal Block Patient position: sitting Prep: site prepped and draped and DuraPrep Location: L3-4 Injection technique: single-shot Needle Needle type: Pencan  Needle gauge: 24 G Needle length: 10 cm Needle insertion depth: 10 cm Additional Notes Patient tolerated well. No immediate complications.

## 2019-07-25 NOTE — Interval H&P Note (Signed)
History and Physical Interval Note:  07/25/2019 9:24 AM  [name]  has presented today for surgery, with the diagnosis of right knee osteoarthritis.  The various methods of treatment have been discussed with the patient and family. After consideration of risks, benefits and other options for treatment, the patient has consented to  Procedure(s) with comments: TOTAL KNEE ARTHROPLASTY (Right) - 91min as a surgical intervention.  The patient's history has been reviewed, patient examined, no change in status, stable for surgery.  I have reviewed the patient's chart and labs.  Questions were answered to the patient's satisfaction.     Pilar Plate Angie Piercey

## 2019-07-25 NOTE — Progress Notes (Signed)
Assisted Dr. Hollis with right, ultrasound guided, adductor canal block. Side rails up, monitors on throughout procedure. See vital signs in flow sheet. Tolerated Procedure well.  

## 2019-07-26 ENCOUNTER — Encounter (HOSPITAL_COMMUNITY): Payer: Self-pay | Admitting: Orthopedic Surgery

## 2019-07-26 LAB — CBC
HCT: 37 % (ref 36.0–46.0)
Hemoglobin: 11.5 g/dL — ABNORMAL LOW (ref 12.0–15.0)
MCH: 28 pg (ref 26.0–34.0)
MCHC: 31.1 g/dL (ref 30.0–36.0)
MCV: 90 fL (ref 80.0–100.0)
Platelets: 292 10*3/uL (ref 150–400)
RBC: 4.11 MIL/uL (ref 3.87–5.11)
RDW: 13.3 % (ref 11.5–15.5)
WBC: 9.8 10*3/uL (ref 4.0–10.5)
nRBC: 0 % (ref 0.0–0.2)

## 2019-07-26 LAB — BASIC METABOLIC PANEL
Anion gap: 8 (ref 5–15)
BUN: 14 mg/dL (ref 6–20)
CO2: 20 mmol/L — ABNORMAL LOW (ref 22–32)
Calcium: 7.9 mg/dL — ABNORMAL LOW (ref 8.9–10.3)
Chloride: 107 mmol/L (ref 98–111)
Creatinine, Ser: 0.97 mg/dL (ref 0.44–1.00)
GFR calc Af Amer: >60 mL/min (ref >60)
GFR calc non Af Amer: >60 mL/min (ref >60)
Glucose, Bld: 180 mg/dL — ABNORMAL HIGH (ref 70–99)
Potassium: 3.9 mmol/L (ref 3.5–5.1)
Sodium: 135 mmol/L (ref 135–145)

## 2019-07-26 MED ORDER — GABAPENTIN 300 MG PO CAPS: 300.0000 mg | ORAL_CAPSULE | Freq: Two times a day (BID) | ORAL | 0 refills | Status: AC

## 2019-07-26 MED ORDER — OXYCODONE HCL 5 MG PO TABS: 5.0000 mg | ORAL_TABLET | Freq: Four times a day (QID) | ORAL | 0 refills | Status: DC | PRN

## 2019-07-26 MED ORDER — ASPIRIN 325 MG PO TBEC: 325.0000 mg | DELAYED_RELEASE_TABLET | Freq: Two times a day (BID) | ORAL | 0 refills | Status: AC

## 2019-07-26 MED ORDER — GABAPENTIN 300 MG PO CAPS: 300.0000 mg | ORAL_CAPSULE | Freq: Two times a day (BID) | ORAL | 0 refills | Status: DC

## 2019-07-26 MED ORDER — METHOCARBAMOL 500 MG PO TABS: 500.0000 mg | ORAL_TABLET | Freq: Four times a day (QID) | ORAL | 0 refills | Status: DC | PRN

## 2019-07-26 NOTE — Progress Notes (Addendum)
Therapy Plan: Outpatient at [facility] DME: Patient arranged with the [facility] to have her RW sent to [facility]. A family member will pick up the RW today and bring to the patient.  Patient has a 3 in1.   CSW sent requested PT notes to [place].

## 2019-07-26 NOTE — Progress Notes (Signed)
Physical Therapy Treatment Patient Details Name: [name] MRN: [id number] DOB: 1960-12-21 Today's Date: 07/26/2019    History of Present Illness Patient is 58 y.o. female s/p Rt TKA on 07/25/19 with PMH significant for HTN, CAD, GERD, OA, depression, and previosu Lt shoulder surgery and bil carpal tunnel release.    PT Comments    Pt ambulated in hallway and performed LE exercises.  Pt provided with HEP handout. Will return this afternoon to practice steps prior to d/c.    Follow Up Recommendations  Follow surgeon's recommendation for DC plan and follow-up therapies     Equipment Recommendations  None recommended by PT    Recommendations for Other Services       Precautions / Restrictions Precautions Precautions: Fall;Knee    Mobility  Bed Mobility Overal bed mobility: Needs Assistance Bed Mobility: Supine to Sit     Supine to sit: Min guard        Transfers Overall transfer level: Needs assistance Equipment used: Rolling walker (2 wheeled) Transfers: Sit to/from Stand Sit to Stand: Min guard         General transfer comment: verbal cues for UE and LE positioning  Ambulation/Gait Ambulation/Gait assistance: Min guard Gait Distance (Feet): 120 Feet Assistive device: Rolling walker (2 wheeled) Gait Pattern/deviations: Step-to pattern;Decreased stance time - right;Antalgic Gait velocity: decreased   General Gait Details: verbal cues for sequence, RW positioning, step length, posture   Stairs             Wheelchair Mobility    Modified Rankin (Stroke Patients Only)       Balance                                            Cognition Arousal/Alertness: Awake/alert Behavior During Therapy: WFL for tasks assessed/performed Overall Cognitive Status: Within Functional Limits for tasks assessed                                        Exercises Total Joint Exercises Ankle Circles/Pumps: AROM;10  reps;Both Quad Sets: AROM;Both;10 reps Short Arc Quad: AROM;Right;10 reps Heel Slides: Right;10 reps;AAROM Hip ABduction/ADduction: AROM;Right;10 reps Straight Leg Raises: AROM;10 reps;Right Knee Flexion: AAROM;Seated;Right;10 reps    General Comments        Pertinent Vitals/Pain Pain Assessment: 0-10 Pain Score: 3  Pain Location: R knee Pain Descriptors / Indicators: Aching;Sore;Tightness Pain Intervention(s): Limited activity within patient's tolerance;Premedicated before session;Repositioned    Home Living                      Prior Function            PT Goals (current goals can now be found in the care plan section) Progress towards PT goals: Progressing toward goals    Frequency    7X/week      PT Plan Current plan remains appropriate    Co-evaluation              AM-PAC PT "6 Clicks" Mobility   Outcome Measure  Help needed turning from your back to your side while in a flat bed without using bedrails?: A Little Help needed moving from lying on your back to sitting on the side of a flat bed without using bedrails?: A Little Help needed moving  to and from a bed to a chair (including a wheelchair)?: A Little Help needed standing up from a chair using your arms (e.g., wheelchair or bedside chair)?: A Little Help needed to walk in hospital room?: A Little Help needed climbing 3-5 steps with a railing? : A Little 6 Click Score: 18    End of Session Equipment Utilized During Treatment: Gait belt Activity Tolerance: Patient tolerated treatment well Patient left: in chair;with call bell/phone within reach;with chair alarm set Nurse Communication: Mobility status PT Visit Diagnosis: Other abnormalities of gait and mobility (R26.89);Muscle weakness (generalized) (M62.81)     Time: [id number] PT Time Calculation (min) (ACUTE ONLY): 30 min  Charges:  $Gait Training: 8-22 mins $Therapeutic Exercise: 8-22 mins                    [name], PT,  DPT Acute Rehabilitation Services Office: [phone] Pager: [phone]   [name] 07/26/2019, 3:43 PM

## 2019-07-26 NOTE — Progress Notes (Signed)
Physical Therapy Treatment Patient Details Name: [name] MRN: [id number] DOB: [birth year] Today's Date: 07/26/2019    History of Present Illness Patient is 58 y.o. female s/p Rt TKA on 07/25/19 with PMH significant for HTN, CAD, GERD, OA, depression, and previous Lt shoulder surgery and bil carpal tunnel release.    PT Comments    Pt ambulated in hallway and practiced safe stair technique.  Pt reports understanding and had no further questions.  Pt feels ready for d/c home today.    Follow Up Recommendations  Follow surgeon's recommendation for DC plan and follow-up therapies     Equipment Recommendations  None recommended by PT    Recommendations for Other Services       Precautions / Restrictions Precautions Precautions: Fall;Knee    Mobility  Bed Mobility Overal bed mobility: Needs Assistance Bed Mobility: Sit to Supine     Supine to sit: Min guard Sit to supine: Supervision      Transfers Overall transfer level: Needs assistance Equipment used: Rolling walker (2 wheeled) Transfers: Sit to/from Stand Sit to Stand: Min guard         General transfer comment: verbal cues for UE and LE positioning  Ambulation/Gait Ambulation/Gait assistance: Min guard Gait Distance (Feet): 200 Feet Assistive device: Rolling walker (2 wheeled) Gait Pattern/deviations: Step-to pattern;Decreased stance time - right;Antalgic Gait velocity: decreased   General Gait Details: verbal cues for sequence, RW positioning, step length, posture   Stairs Stairs: Yes Stairs assistance: Supervision Stair Management: Step to pattern;Forwards;One rail Left;With cane Number of Stairs: 3 General stair comments: verbal cues for sequence, safety; pt reports understanding, sister present and observed   Wheelchair Mobility    Modified Rankin (Stroke Patients Only)       Balance                                            Cognition Arousal/Alertness:  Awake/alert Behavior During Therapy: WFL for tasks assessed/performed Overall Cognitive Status: Within Functional Limits for tasks assessed                                        Exercises     General Comments        Pertinent Vitals/Pain Pain Assessment: 0-10 Pain Score: 4  Pain Location: R knee Pain Descriptors / Indicators: Aching;Sore;Tightness Pain Intervention(s): Monitored during session;Repositioned;Premedicated before session    Home Living                      Prior Function            PT Goals (current goals can now be found in the care plan section) Progress towards PT goals: Progressing toward goals    Frequency    7X/week      PT Plan Current plan remains appropriate    Co-evaluation              AM-PAC PT "6 Clicks" Mobility   Outcome Measure  Help needed turning from your back to your side while in a flat bed without using bedrails?: A Little Help needed moving from lying on your back to sitting on the side of a flat bed without using bedrails?: A Little Help needed moving to and from a bed to a chair (  including a wheelchair)?: A Little Help needed standing up from a chair using your arms (e.g., wheelchair or bedside chair)?: A Little Help needed to walk in hospital room?: A Little Help needed climbing 3-5 steps with a railing? : A Little 6 Click Score: 18    End of Session Equipment Utilized During Treatment: Gait belt Activity Tolerance: Patient tolerated treatment well Patient left: with call bell/phone within reach;in bed Nurse Communication: Mobility status PT Visit Diagnosis: Other abnormalities of gait and mobility (R26.89);Muscle weakness (generalized) (M62.81)     Time: [id number] PT Time Calculation (min) (ACUTE ONLY): 22 min  Charges:  $Gait Training: 8-22 mins                    [name], PT, [place] Office: [phone] Pager: [phone]  [name] 07/26/2019, 3:55 PM

## 2019-07-26 NOTE — Progress Notes (Signed)
Subjective: 1 Day Post-Op Procedure(s) (LRB): TOTAL KNEE ARTHROPLASTY (Right) Patient reports pain as mild.   Patient seen in rounds by Dr. Wynelle Link. Patient is well, and has had no acute complaints or problems other than discomfort in the right knee. No acute events overnight. Foley catheter removed, positive flatus. Patient states she is ready to go home today. Denies CP, SHOB. We will continue therapy today.   Objective: Vital signs in last 24 hours: Temp:  [97.3 F (36.3 C)-98.6 F (37 C)] 98.4 F (36.9 C) (09/29 0446) Pulse Rate:  [62-96] 69 (09/29 0446) Resp:  [10-21] 16 (09/29 0446) BP: (109-140)/(64-87) 132/74 (09/29 0446) SpO2:  [93 %-100 %] 97 % (09/29 0446) Weight:  [122.7 kg] 122.7 kg (09/28 0942)  Intake/Output from previous day:  Intake/Output Summary (Last 24 hours) at 07/26/2019 0713 Last data filed at 07/26/2019 0600 Gross per 24 hour  Intake 3667.77 ml  Output 3615 ml  Net 52.77 ml     Intake/Output this shift: No intake/output data recorded.  Labs: Recent Labs    07/26/19 0247  HGB 11.5*   Recent Labs    07/26/19 0247  WBC 9.8  RBC 4.11  HCT 37.0  PLT 292   Recent Labs    07/26/19 0247  NA 135  K 3.9  CL 107  CO2 20*  BUN 14  CREATININE 0.97  GLUCOSE 180*  CALCIUM 7.9*   No results for input(s): LABPT, INR in the last 72 hours.  Exam: General - Patient is Alert and Oriented Extremity - Neurologically intact Sensation intact distally Intact pulses distally Dorsiflexion/Plantar flexion intact Dressing - dressing C/D/I Motor Function - intact, moving foot and toes well on exam.   Past Medical History:  Diagnosis Date  . Coronary artery disease CARDIOLOGIST-- DR Marlou Porch--  WILL REQUEST NOTE   MILD NON-OBSTRUCTIVE CAD  . GERD (gastroesophageal reflux disease)   . H/O hiatal hernia   . History of carpal tunnel syndrome    Bilateral  . History of iron deficiency anemia   . History of MRSA infection   . Hypertension   .  Impingement syndrome of left shoulder   . Knee pain    r/knee  . OA (osteoarthritis)   . Pre-diabetes   . Severe major depression with psychotic features (HCC)     Assessment/Plan: 1 Day Post-Op Procedure(s) (LRB): TOTAL KNEE ARTHROPLASTY (Right) Principal Problem:   OA (osteoarthritis) of knee  Estimated body mass index is 41.14 kg/m as calculated from the following:   Height as of this encounter: 5\' 8"  (1.727 m).   Weight as of this encounter: 122.7 kg. Advance diet Up with therapy D/C IV fluids   Patient's anticipated LOS is less than 2 midnights, meeting these requirements: - Younger than 77 - Lives within 1 hour of care - Has a competent adult at home to recover with post-op recover - NO history of  - Chronic pain requiring opiods  - Diabetes  - Coronary Artery Disease  - Heart failure  - Heart attack  - Stroke  - DVT/VTE  - Cardiac arrhythmia  - Respiratory Failure/COPD  - Renal failure  - Anemia  - Advanced Liver disease  DVT Prophylaxis - Aspirin Weight bearing as tolerated. D/C O2 and pulse ox and try on room air. Hemovac pulled without difficulty, will begin therapy today.  Plan is to go Home after hospital stay. Plan for discharge today following 1-2 sessions of therapy as long as she continues to meet her goals. Scheduled  for OPPT at [facility]. Follow up in the office in 2 weeks.   Griffith Citron, PA-C Orthopedic Surgery [phone] 07/26/2019, 7:13 AM

## 2019-07-27 NOTE — Discharge Summary (Signed)
Physician Discharge Summary   Patient ID: [name] MRN: [id number] DOB/AGE: Jun 29, 1961 58 y.o.  Admit date: 07/25/2019 Discharge date: 07/26/2019  Primary Diagnosis: Osteoarthritis, right knee  Admission Diagnoses:  Past Medical History:  Diagnosis Date  . Coronary artery disease CARDIOLOGIST-- DR Marlou Porch--  WILL REQUEST NOTE   MILD NON-OBSTRUCTIVE CAD  . GERD (gastroesophageal reflux disease)   . H/O hiatal hernia   . History of carpal tunnel syndrome    Bilateral  . History of iron deficiency anemia   . History of MRSA infection   . Hypertension   . Impingement syndrome of left shoulder   . Knee pain    r/knee  . OA (osteoarthritis)   . Pre-diabetes   . Severe major depression with psychotic features [facility])    Discharge Diagnoses:   Principal Problem:   OA (osteoarthritis) of knee  Estimated body mass index is 41.14 kg/m as calculated from the following:   Height as of this encounter: 5\' 8"  (1.727 m).   Weight as of this encounter: 122.7 kg.  Procedure:  Procedure(s) (LRB): TOTAL KNEE ARTHROPLASTY (Right)   Consults: None  HPI:  [name] is a 58 y.o. year old female with end stage OA of her right knee with progressively worsening pain and dysfunction. She has constant pain, with activity and at rest and significant functional deficits with difficulties even with ADLs. She has had extensive non-op management including analgesics, injections of cortisone and viscosupplements, and home exercise program, but remains in significant pain with significant dysfunction.Radiographs show bone on bone arthritis medial. She presents now for right Total Knee Arthroplasty.     Laboratory Data: Admission on 07/25/2019, Discharged on 07/26/2019  Component Date Value Ref Range Status  . WBC 07/26/2019 9.8  4.0 - 10.5 K/uL Final  . RBC 07/26/2019 4.11  3.87 - 5.11 MIL/uL Final  . Hemoglobin 07/26/2019 11.5* 12.0 - 15.0 g/dL Final  . HCT 07/26/2019 37.0  36.0 - 46.0 % Final   . MCV 07/26/2019 90.0  80.0 - 100.0 fL Final  . MCH 07/26/2019 28.0  26.0 - 34.0 pg Final  . MCHC 07/26/2019 31.1  30.0 - 36.0 g/dL Final  . RDW 07/26/2019 13.3  11.5 - 15.5 % Final  . Platelets 07/26/2019 292  150 - 400 K/uL Final  . nRBC 07/26/2019 0.0  0.0 - 0.2 % Final   Performed at [facility], [place] [address], [place], [place] [address]  . Sodium 07/26/2019 135  135 - 145 mmol/L Final  . Potassium 07/26/2019 3.9  3.5 - 5.1 mmol/L Final  . Chloride 07/26/2019 107  98 - 111 mmol/L Final  . CO2 07/26/2019 20* 22 - 32 mmol/L Final  . Glucose, Bld 07/26/2019 180* 70 - 99 mg/dL Final  . BUN 07/26/2019 14  6 - 20 mg/dL Final  . Creatinine, Ser 07/26/2019 0.97  0.44 - 1.00 mg/dL Final  . Calcium 07/26/2019 7.9* 8.9 - 10.3 mg/dL Final  . GFR calc non Af Amer 07/26/2019 >60  >60 mL/min Final  . GFR calc Af Amer 07/26/2019 >60  >60 mL/min Final  . Anion gap 07/26/2019 8  5 - 15 Final   Performed at [facility], [place] [address] [place], [place] [address]  Hospital Outpatient Visit on 07/21/2019  Component Date Value Ref Range Status  . SARS-CoV-2, NAA 07/21/2019 NOT DETECTED  NOT DETECTED Final   Comment: (NOTE) This nucleic acid amplification test was developed and its performance characteristics determined  by [employer]. Nucleic acid amplification tests include PCR and TMA. This test has not been FDA cleared or approved. This test has been authorized by FDA under an Emergency Use Authorization (EUA). This test is only authorized for the duration of time the declaration that circumstances exist justifying the authorization of the emergency use of in vitro diagnostic tests for detection of SARS-CoV-2 virus and/or diagnosis of COVID-19 infection under section 564(b)(1) of the Act, 21 U.S.C. [id number]) (1), unless the authorization is terminated or revoked sooner. When diagnostic testing is negative, the possibility of a false negative  result should be considered in the context of a patient's recent exposures and the presence of clinical signs and symptoms consistent with COVID-19. An individual without symptoms of COVID- 19 and who is not shedding SARS-CoV-2 vi                          rus would expect to have a negative (not detected) result in this assay. Performed At: [facility] [place], [place] [id number] Rush Farmer MD [id number]   . Coronavirus Source 07/21/2019 NASOPHARYNGEAL   Final   Performed at [facility], [place] [address], [place], [place] [address]  Hospital Outpatient Visit on 07/20/2019  Component Date Value Ref Range Status  . aPTT 07/20/2019 32  24 - 36 seconds Final   Performed at [facility], [place] [address] [place], [place] [address]  . WBC 07/20/2019 4.2  4.0 - 10.5 K/uL Final  . RBC 07/20/2019 4.54  3.87 - 5.11 MIL/uL Final  . Hemoglobin 07/20/2019 12.7  12.0 - 15.0 g/dL Final  . HCT 07/20/2019 40.4  36.0 - 46.0 % Final  . MCV 07/20/2019 89.0  80.0 - 100.0 fL Final  . MCH 07/20/2019 28.0  26.0 - 34.0 pg Final  . MCHC 07/20/2019 31.4  30.0 - 36.0 g/dL Final  . RDW 07/20/2019 13.4  11.5 - 15.5 % Final  . Platelets 07/20/2019 263  150 - 400 K/uL Final  . nRBC 07/20/2019 0.0  0.0 - 0.2 % Final   Performed at [facility], [place] [address], [place], [place] [address]  . Sodium 07/20/2019 141  135 - 145 mmol/L Final  . Potassium 07/20/2019 3.4* 3.5 - 5.1 mmol/L Final  . Chloride 07/20/2019 106  98 - 111 mmol/L Final  . CO2 07/20/2019 26  22 - 32 mmol/L Final  . Glucose, Bld 07/20/2019 93  70 - 99 mg/dL Final  . BUN 07/20/2019 15  6 - 20 mg/dL Final  . Creatinine, Ser 07/20/2019 1.06* 0.44 - 1.00 mg/dL Final  . Calcium 07/20/2019 8.5* 8.9 - 10.3 mg/dL Final  . Total Protein 07/20/2019 7.1  6.5 - 8.1 g/dL Final  . Albumin 07/20/2019 3.9  3.5 - 5.0 g/dL Final  . AST 07/20/2019 23  15 - 41 U/L Final  . ALT 07/20/2019 32  0 -  44 U/L Final  . Alkaline Phosphatase 07/20/2019 100  38 - 126 U/L Final  . Total Bilirubin 07/20/2019 0.5  0.3 - 1.2 mg/dL Final  . GFR calc non Af Amer 07/20/2019 58* >60 mL/min Final  . GFR calc Af Amer 07/20/2019 >60  >60 mL/min Final  . Anion gap 07/20/2019 9  5 - 15 Final   Performed at [facility], [place] [address], [place], [place] [address]  . Prothrombin Time 07/20/2019 13.1  11.4 - 15.2 seconds Final  .  INR 07/20/2019 1.0  0.8 - 1.2 Final   Comment: (NOTE) INR goal varies based on device and disease states. Performed at [facility], [place] [address] [place], [place] [address]   . ABO/RH(D) 07/20/2019 A POS   Final  . Antibody Screen 07/20/2019 NEG   Final  . Sample Expiration 07/20/2019 07/28/2019,2359   Final  . Extend sample reason 07/20/2019    Final                   Value:NO TRANSFUSIONS OR PREGNANCY IN THE PAST 3 MONTHS Performed at [place] [address] [place], [place] [address]   . MRSA, PCR 07/20/2019 NEGATIVE  NEGATIVE Final  . Staphylococcus aureus 07/20/2019 NEGATIVE  NEGATIVE Final   Comment: (NOTE) The Xpert SA Assay (FDA approved for NASAL specimens in patients 41 years of age and older), is one component of a comprehensive surveillance program. It is not intended to diagnose infection nor to guide or monitor treatment. Performed at [facility], [place] [address], [place], [place] [address]   . Hgb A1c MFr Bld 07/20/2019 6.7* 4.8 - 5.6 % Final   Comment: (NOTE)         Prediabetes: 5.7 - 6.4         Diabetes: >6.4         Glycemic control for adults with diabetes: <7.0   . Mean Plasma Glucose 07/20/2019 146  mg/dL Final   Comment: (NOTE) Performed At: [facility] [place], [place] [id number] Rush Farmer MD [id number]   . ABO/RH(D) 07/20/2019    Final                   Value:A POS Performed at [facility], [place]  [address], [place],  [address]      X-Rays:No results found.  EKG: Orders placed or performed during the hospital encounter of 07/20/19  . EKG 12 lead  . EKG 12 lead     Hospital Course: [name] is a 58 y.o. who was admitted to [facility]. They were brought to the operating room on 07/25/2019 and underwent Procedure(s): TOTAL KNEE ARTHROPLASTY.  Patient tolerated the procedure well and was later transferred to the recovery room and then to the orthopaedic floor for postoperative care. They were given PO and IV analgesics for pain control following their surgery. They were given 24 hours of postoperative antibiotics of  Anti-infectives (From admission, onward)   Start     Dose/Rate Route Frequency Ordered Stop   07/25/19 1730  ceFAZolin (ANCEF) IVPB 2g/100 mL premix     2 g 200 mL/hr over 30 Minutes Intravenous Every 6 hours 07/25/19 1413 07/25/19 2350   07/25/19 0600  ceFAZolin (ANCEF) 3 g in dextrose 5 % 50 mL IVPB     3 g 100 mL/hr over 30 Minutes Intravenous On call to O.R. 07/24/19 1222 07/25/19 1206     and started on DVT prophylaxis in the form of Aspirin.   PT and OT were ordered for total joint protocol. Discharge planning consulted to help with postop disposition and equipment needs.  Patient had a good night on the evening of surgery. They started to get up OOB with therapy on POD #0. Pt was seen during rounds and was ready to go home pending progress with therapy. Hemovac drain was pulled without difficulty. She worked with therapy on POD #1 and was meeting her goals. Pt was  discharged to home later that day in stable condition.  Diet: Regular diet Activity: WBAT Follow-up: in 2 weeks Disposition: Home Discharged Condition: good   Discharge Instructions    Call MD / Call 911   Complete by: As directed    If you experience chest pain or shortness of breath, CALL 911 and be transported to the hospital emergency room.  If you develope a fever above 101  F, pus (white drainage) or increased drainage or redness at the wound, or calf pain, call your surgeon's office.   Change dressing   Complete by: As directed    Change dressing on Wednesday, then change the dressing daily with sterile 4 x 4 inch gauze dressing and apply TED hose.   Constipation Prevention   Complete by: As directed    Drink plenty of fluids.  Prune juice may be helpful.  You may use a stool softener, such as Colace (over the counter) 100 mg twice a day.  Use MiraLax (over the counter) for constipation as needed.   Diet - low sodium heart healthy   Complete by: As directed    Discharge instructions   Complete by: As directed    Dr. Gaynelle Arabian Total Joint Specialist [facility] [address], [place], [place] [address] [phone]  TOTAL KNEE REPLACEMENT POSTOPERATIVE DIRECTIONS  Knee Rehabilitation, Guidelines Following Surgery  Results after knee surgery are often greatly improved when you follow the exercise, range of motion and muscle strengthening exercises prescribed by your doctor. Safety measures are also important to protect the knee from further injury. Any time any of these exercises cause you to have increased pain or swelling in your knee joint, decrease the amount until you are comfortable again and slowly increase them. If you have problems or questions, call your caregiver or physical therapist for advice.   HOME CARE INSTRUCTIONS  Remove items at home which could result in a fall. This includes throw rugs or furniture in walking pathways.  ICE to the affected knee every three hours for 30 minutes at a time and then as needed for pain and swelling.  Continue to use ice on the knee for pain and swelling from surgery. You may notice swelling that will progress down to the foot and ankle.  This is normal after surgery.  Elevate the leg when you are not up walking on it.   Continue to use the breathing machine which will help keep your temperature  down.  It is common for your temperature to cycle up and down following surgery, especially at night when you are not up moving around and exerting yourself.  The breathing machine keeps your lungs expanded and your temperature down. Do not place pillow under knee, focus on keeping the knee straight while resting   DIET You may resume your previous home diet once your are discharged from the hospital.  DRESSING / WOUND CARE / SHOWERING You may change your dressing 3-5 days after surgery.  Then change the dressing every day with sterile gauze.  Please use good hand washing techniques before changing the dressing.  Do not use any lotions or creams on the incision until instructed by your surgeon. You may start showering once you are discharged home but do not submerge the incision under water. Just pat the incision dry and apply a dry gauze dressing on daily. Change the surgical dressing daily and reapply a dry dressing each time.  ACTIVITY Walk with your walker as instructed. Use walker  as long as suggested by your caregivers. Avoid periods of inactivity such as sitting longer than an hour when not asleep. This helps prevent blood clots.  You may resume a sexual relationship in one month or when given the OK by your doctor.  You may return to work once you are cleared by your doctor.  Do not drive a car for 6 weeks or until released by you surgeon.  Do not drive while taking narcotics.  WEIGHT BEARING Weight bearing as tolerated with assist device (walker, cane, etc) as directed, use it as long as suggested by your surgeon or therapist, typically at least 4-6 weeks.  POSTOPERATIVE CONSTIPATION PROTOCOL Constipation - defined medically as fewer than three stools per week and severe constipation as less than one stool per week.  One of the most common issues patients have following surgery is constipation.  Even if you have a regular bowel pattern at home, your normal regimen is likely to be  disrupted due to multiple reasons following surgery.  Combination of anesthesia, postoperative narcotics, change in appetite and fluid intake all can affect your bowels.  In order to avoid complications following surgery, here are some recommendations in order to help you during your recovery period.  Colace (docusate) - Pick up an over-the-counter form of Colace or another stool softener and take twice a day as long as you are requiring postoperative pain medications.  Take with a full glass of water daily.  If you experience loose stools or diarrhea, hold the colace until you stool forms back up.  If your symptoms do not get better within 1 week or if they get worse, check with your doctor.  Dulcolax (bisacodyl) - Pick up over-the-counter and take as directed by the product packaging as needed to assist with the movement of your bowels.  Take with a full glass of water.  Use this product as needed if not relieved by Colace only.   MiraLax (polyethylene glycol) - Pick up over-the-counter to have on hand.  MiraLax is a solution that will increase the amount of water in your bowels to assist with bowel movements.  Take as directed and can mix with a glass of water, juice, soda, coffee, or tea.  Take if you go more than two days without a movement. Do not use MiraLax more than once per day. Call your doctor if you are still constipated or irregular after using this medication for 7 days in a row.  If you continue to have problems with postoperative constipation, please contact the office for further assistance and recommendations.  If you experience "the worst abdominal pain ever" or develop nausea or vomiting, please contact the office immediatly for further recommendations for treatment.  ITCHING  If you experience itching with your medications, try taking only a single pain pill, or even half a pain pill at a time.  You can also use Benadryl over the counter for itching or also to help with sleep.    TED HOSE STOCKINGS Wear the elastic stockings on both legs for three weeks following surgery during the day but you may remove then at night for sleeping.  MEDICATIONS See your medication summary on the "After Visit Summary" that the nursing staff will review with you prior to discharge.  You may have some home medications which will be placed on hold until you complete the course of blood thinner medication.  It is important for you to complete the blood thinner medication as prescribed by your surgeon.  Continue your approved medications as instructed at time of discharge.  PRECAUTIONS If you experience chest pain or shortness of breath - call 911 immediately for transfer to the hospital emergency department.  If you develop a fever greater that 101 F, purulent drainage from wound, increased redness or drainage from wound, foul odor from the wound/dressing, or calf pain - CONTACT YOUR SURGEON.                                                   FOLLOW-UP APPOINTMENTS Make sure you keep all of your appointments after your operation with your surgeon and caregivers. You should call the office at the above phone number and make an appointment for approximately two weeks after the date of your surgery or on the date instructed by your surgeon outlined in the "After Visit Summary".   RANGE OF MOTION AND STRENGTHENING EXERCISES  Rehabilitation of the knee is important following a knee injury or an operation. After just a few days of immobilization, the muscles of the thigh which control the knee become weakened and shrink (atrophy). Knee exercises are designed to build up the tone and strength of the thigh muscles and to improve knee motion. Often times heat used for twenty to thirty minutes before working out will loosen up your tissues and help with improving the range of motion but do not use heat for the first two weeks following surgery. These exercises can be done on a training (exercise) mat, on the  floor, on a table or on a bed. Use what ever works the best and is most comfortable for you Knee exercises include:  Leg Lifts - While your knee is still immobilized in a splint or cast, you can do straight leg raises. Lift the leg to 60 degrees, hold for 3 sec, and slowly lower the leg. Repeat 10-20 times 2-3 times daily. Perform this exercise against resistance later as your knee gets better.  Quad and Hamstring Sets - Tighten up the muscle on the front of the thigh (Quad) and hold for 5-10 sec. Repeat this 10-20 times hourly. Hamstring sets are done by pushing the foot backward against an object and holding for 5-10 sec. Repeat as with quad sets.  Leg Slides: Lying on your back, slowly slide your foot toward your buttocks, bending your knee up off the floor (only go as far as is comfortable). Then slowly slide your foot back down until your leg is flat on the floor again. Angel Wings: Lying on your back spread your legs to the side as far apart as you can without causing discomfort.  A rehabilitation program following serious knee injuries can speed recovery and prevent re-injury in the future due to weakened muscles. Contact your doctor or a physical therapist for more information on knee rehabilitation.   IF YOU ARE TRANSFERRED TO A SKILLED REHAB FACILITY If the patient is transferred to a skilled rehab facility following release from the hospital, a list of the current medications will be sent to the facility for the patient to continue.  When discharged from the skilled rehab facility, please have the facility set up the patient's [place] prior to being released. Also, the skilled facility will be responsible for providing the patient with their medications at time of release from the facility to include their pain medication,  the muscle relaxants, and their blood thinner medication. If the patient is still at the rehab facility at time of the two week follow up appointment, the  skilled rehab facility will also need to assist the patient in arranging follow up appointment in our office and any transportation needs.  MAKE SURE YOU:  Understand these instructions.  Get help right away if you are not doing well or get worse.    Pick up stool softner and laxative for home use following surgery while on pain medications. Do not submerge incision under water. Please use good hand washing techniques while changing dressing each day. May shower starting three days after surgery. Please use a clean towel to pat the incision dry following showers. Continue to use ice for pain and swelling after surgery. Do not use any lotions or creams on the incision until instructed by your surgeon.   Do not put a pillow under the knee. Place it under the heel.   Complete by: As directed    Driving restrictions   Complete by: As directed    No driving for two weeks   TED hose   Complete by: As directed    Use stockings (TED hose) for three weeks on both leg(s).  You may remove them at night for sleeping.   Weight bearing as tolerated   Complete by: As directed      Allergies as of 07/26/2019      Reactions   Adhesive [tape] Other (See Comments)   Irritation and tears skin      Medication List    TAKE these medications   amLODipine 10 MG tablet Commonly known as: NORVASC Take 5 mg by mouth daily.   aspirin 325 MG EC tablet Take 1 tablet (325 mg total) by mouth 2 (two) times daily for 20 days. Take one tablet (325 mg) Aspirin two times a day for three weeks following surgery.Then resume home aspirin dose. What changed:   medication strength  how much to take  when to take this  additional instructions   atorvastatin 80 MG tablet Commonly known as: LIPITOR Take 80 mg by mouth at bedtime.   buPROPion 150 MG 24 hr tablet Commonly known as: WELLBUTRIN XL Take 150 mg by mouth 3 (three) times daily.   cholecalciferol 25 MCG (1000 UT) tablet Commonly known as:  VITAMIN D Take 1,000 Units by mouth daily.   cyclobenzaprine 10 MG tablet Commonly known as: FLEXERIL Take 10 mg by mouth 2 (two) times daily as needed. Muscle spasms   gabapentin 300 MG capsule Commonly known as: NEURONTIN Take 1 capsule (300 mg total) by mouth 2 (two) times daily.   hydrOXYzine 25 MG tablet Commonly known as: ATARAX/VISTARIL Take 25 mg by mouth 2 (two) times daily as needed for anxiety.   methocarbamol 500 MG tablet Commonly known as: ROBAXIN Take 1 tablet (500 mg total) by mouth every 6 (six) hours as needed for muscle spasms.   oxyCODONE 5 MG immediate release tablet Commonly known as: Oxy IR/ROXICODONE Take 1-2 tablets (5-10 mg total) by mouth every 6 (six) hours as needed for moderate pain (pain score 4-6).   pantoprazole 40 MG tablet Commonly known as: PROTONIX Take 40 mg by mouth daily.   prazosin 1 MG capsule Commonly known as: MINIPRESS Take 1 mg by mouth at bedtime.   QUEtiapine 200 MG tablet Commonly known as: SEROQUEL Take 200 mg by mouth at bedtime.   QUEtiapine 50 MG tablet Commonly known as: SEROQUEL Take  50 mg by mouth daily.   sertraline 50 MG tablet Commonly known as: ZOLOFT Take 50 mg by mouth 3 (three) times daily.   sucralfate 1 g tablet Commonly known as: CARAFATE Take 1 g by mouth 4 (four) times daily as needed (acid reflux).            Discharge Care Instructions  (From admission, onward)         Start     Ordered   07/26/19 0000  Weight bearing as tolerated     07/26/19 0720   07/26/19 0000  Change dressing    Comments: Change dressing on Wednesday, then change the dressing daily with sterile 4 x 4 inch gauze dressing and apply TED hose.   07/26/19 0720         Follow-up Information    Gaynelle Arabian, MD. Schedule an appointment as soon as possible for a visit on 08/09/2019.   Specialty: Orthopedic Surgery Contact information: [address] [place] [address] [id number]            Signed: Griffith Citron, PA-C Orthopedic Surgery 07/27/2019, 8:25 AM

## 2019-08-15 ENCOUNTER — Other Ambulatory Visit: Payer: Self-pay

## 2019-08-15 DIAGNOSIS — Z20822 Contact with and (suspected) exposure to covid-19: Secondary | ICD-10-CM

## 2019-08-17 LAB — NOVEL CORONAVIRUS, NAA: SARS-CoV-2, NAA: NOT DETECTED

## 2020-06-22 ENCOUNTER — Other Ambulatory Visit: Payer: Self-pay

## 2020-06-22 DIAGNOSIS — Z20822 Contact with and (suspected) exposure to covid-19: Secondary | ICD-10-CM

## 2020-06-23 LAB — NOVEL CORONAVIRUS, NAA: SARS-CoV-2, NAA: NOT DETECTED

## 2020-06-23 LAB — SARS-COV-2, NAA 2 DAY TAT

## 2020-11-01 ENCOUNTER — Other Ambulatory Visit: Payer: No Typology Code available for payment source

## 2020-11-01 ENCOUNTER — Other Ambulatory Visit (HOSPITAL_COMMUNITY): Payer: No Typology Code available for payment source

## 2020-11-01 DIAGNOSIS — Z20822 Contact with and (suspected) exposure to covid-19: Secondary | ICD-10-CM

## 2020-11-03 LAB — SARS-COV-2, NAA 2 DAY TAT

## 2020-11-03 LAB — NOVEL CORONAVIRUS, NAA: SARS-CoV-2, NAA: NOT DETECTED

## 2020-11-23 ENCOUNTER — Other Ambulatory Visit: Payer: No Typology Code available for payment source

## 2020-12-07 NOTE — Patient Instructions (Addendum)
DUE TO COVID-19 ONLY ONE VISITOR IS ALLOWED TO COME WITH YOU AND STAY IN THE WAITING ROOM ONLY DURING PRE OP AND PROCEDURE DAY OF SURGERY. THE 1 VISITOR  MAY VISIT WITH YOU AFTER SURGERY IN YOUR PRIVATE ROOM DURING VISITING HOURS ONLY!  YOU NEED TO HAVE A COVID 19 TEST ON: 12/15/20 @ 11:00 AM , THIS TEST MUST BE DONE BEFORE SURGERY,  COVID TESTING SITE [place] [id number], IT IS ON THE RIGHT GOING OUT [address] APPROXIMATELY  2 MINUTES PAST [employer] ON THE RIGHT. ONCE YOUR COVID TEST IS COMPLETED,  PLEASE BEGIN THE QUARANTINE INSTRUCTIONS AS OUTLINED IN YOUR HANDOUT.                MAEDELL HEDGER   Your procedure is scheduled on: 12/19/20   Report to [facility] Main  Entrance   Report to admitting at: 1:10 PM     Call this number if you have problems the morning of surgery [phone]    Remember:  NO SOLID FOOD AFTER MIDNIGHT THE NIGHT PRIOR TO SURGERY. NOTHING BY MOUTH EXCEPT CLEAR LIQUIDS UNTIL: 12:40 PM . PLEASE FINISH ENSURE DRINK PER SURGEON ORDER  WHICH NEEDS TO BE COMPLETED AT: 12:40 PM .  CLEAR LIQUID DIET  Foods Allowed                                                                     Foods Excluded  Coffee and tea, regular and decaf                             liquids that you cannot  Plain Jell-O any favor except red or purple                                           see through such as: Fruit ices (not with fruit pulp)                                     milk, soups, orange juice  Iced Popsicles                                    All solid food Carbonated beverages, regular and diet                                    Cranberry, grape and apple juices Sports drinks like Gatorade Lightly seasoned clear broth or consume(fat free) Sugar, honey syrup  Sample Menu Breakfast                                Lunch  Supper Cranberry juice                    Beef broth                             Chicken broth Jell-O                                     Grape juice                           Apple juice Coffee or tea                        Jell-O                                      Popsicle                                                Coffee or tea                        Coffee or tea  _____________________________________________________________________   BRUSH YOUR TEETH MORNING OF SURGERY AND RINSE YOUR MOUTH OUT, NO CHEWING GUM CANDY OR MINTS.    Take these medicines the morning of surgery with A SIP OF WATER:gabapentin,amlodipine,bupropion,pantoprazole,sertraline.                                 You may not have any metal on your body including hair pins and              piercings  Do not wear jewelry, make-up, lotions, powders or perfumes, deodorant             Do not wear nail polish on your fingernails.  Do not shave  48 hours prior to surgery.      Do not bring valuables to the hospital. [place].  Contacts, dentures or bridgework may not be worn into surgery.  Leave suitcase in the car. After surgery it may be brought to your room.     Patients discharged the day of surgery will not be allowed to drive home. IF YOU ARE HAVING SURGERY AND GOING HOME THE SAME DAY, YOU MUST HAVE AN ADULT TO DRIVE YOU HOME AND BE WITH YOU FOR 24 HOURS. YOU MAY GO HOME BY TAXI OR UBER OR ORTHERWISE, BUT AN ADULT MUST ACCOMPANY YOU HOME AND STAY WITH YOU FOR 24 HOURS.  Name and phone number of your driver:  Special Instructions: N/A              Please read over the following fact sheets you were given: _____________________________________________________________________          [facility] - Preparing for Surgery Before surgery, you can play an important role.  Because skin is not sterile, your skin needs to be as free of germs as  possible.  You can reduce the number of germs on your skin by washing with CHG (chlorahexidine gluconate)  soap before surgery.  CHG is an antiseptic cleaner which kills germs and bonds with the skin to continue killing germs even after washing. Please DO NOT use if you have an allergy to CHG or antibacterial soaps.  If your skin becomes reddened/irritated stop using the CHG and inform your nurse when you arrive at Short Stay. Do not shave (including legs and underarms) for at least 48 hours prior to the first CHG shower.  You may shave your face/neck. Please follow these instructions carefully:  1.  Shower with CHG Soap the night before surgery and the  morning of Surgery.  2.  If you choose to wash your hair, wash your hair first as usual with your  normal  shampoo.  3.  After you shampoo, rinse your hair and body thoroughly to remove the  shampoo.                           4.  Use CHG as you would any other liquid soap.  You can apply chg directly  to the skin and wash                       Gently with a scrungie or clean washcloth.  5.  Apply the CHG Soap to your body ONLY FROM THE NECK DOWN.   Do not use on face/ open                           Wound or open sores. Avoid contact with eyes, ears mouth and genitals (private parts).                       Wash face,  Genitals (private parts) with your normal soap.             6.  Wash thoroughly, paying special attention to the area where your surgery  will be performed.  7.  Thoroughly rinse your body with warm water from the neck down.  8.  DO NOT shower/wash with your normal soap after using and rinsing off  the CHG Soap.                9.  Pat yourself dry with a clean towel.            10.  Wear clean pajamas.            11.  Place clean sheets on your bed the night of your first shower and do not  sleep with pets. Day of Surgery : Do not apply any lotions/deodorants the morning of surgery.  Please wear clean clothes to the hospital/surgery center.  FAILURE TO FOLLOW THESE INSTRUCTIONS MAY RESULT IN THE CANCELLATION OF YOUR SURGERY PATIENT  SIGNATURE_________________________________  NURSE SIGNATURE__________________________________  ________________________________________________________________________   Adam Phenix  An incentive spirometer is a tool that can help keep your lungs clear and active. This tool measures how well you are filling your lungs with each breath. Taking long deep breaths may help reverse or decrease the chance of developing breathing (pulmonary) problems (especially infection) following:  A long period of time when you are unable to move or be active. BEFORE THE PROCEDURE   If the spirometer includes an indicator to show your best effort, your nurse or respiratory  therapist will set it to a desired goal.  If possible, sit up straight or lean slightly forward. Try not to slouch.  Hold the incentive spirometer in an upright position. INSTRUCTIONS FOR USE  1. Sit on the edge of your bed if possible, or sit up as far as you can in bed or on a chair. 2. Hold the incentive spirometer in an upright position. 3. Breathe out normally. 4. Place the mouthpiece in your mouth and seal your lips tightly around it. 5. Breathe in slowly and as deeply as possible, raising the piston or the ball toward the top of the column. 6. Hold your breath for 3-5 seconds or for as long as possible. Allow the piston or ball to fall to the bottom of the column. 7. Remove the mouthpiece from your mouth and breathe out normally. 8. Rest for a few seconds and repeat Steps 1 through 7 at least 10 times every 1-2 hours when you are awake. Take your time and take a few normal breaths between deep breaths. 9. The spirometer may include an indicator to show your best effort. Use the indicator as a goal to work toward during each repetition. 10. After each set of 10 deep breaths, practice coughing to be sure your lungs are clear. If you have an incision (the cut made at the time of surgery), support your incision when coughing  by placing a pillow or rolled up towels firmly against it. Once you are able to get out of bed, walk around indoors and cough well. You may stop using the incentive spirometer when instructed by your caregiver.  RISKS AND COMPLICATIONS  Take your time so you do not get dizzy or light-headed.  If you are in pain, you may need to take or ask for pain medication before doing incentive spirometry. It is harder to take a deep breath if you are having pain. AFTER USE  Rest and breathe slowly and easily.  It can be helpful to keep track of a log of your progress. Your caregiver can provide you with a simple table to help with this. If you are using the spirometer at home, follow these instructions: [place] IF:   You are having difficultly using the spirometer.  You have trouble using the spirometer as often as instructed.  Your pain medication is not giving enough relief while using the spirometer.  You develop fever of 100.5 F (38.1 C) or higher. SEEK IMMEDIATE MEDICAL CARE IF:   You cough up bloody sputum that had not been present before.  You develop fever of 102 F (38.9 C) or greater.  You develop worsening pain at or near the incision site. MAKE SURE YOU:   Understand these instructions.  Will watch your condition.  Will get help right away if you are not doing well or get worse. Document Released: 02/23/2007 Document Revised: 01/05/2012 Document Reviewed: 04/26/2007 [facility] Patient Information 2014 [place], [place].   ________________________________________________________________________

## 2020-12-10 ENCOUNTER — Encounter (HOSPITAL_COMMUNITY)
Admission: RE | Admit: 2020-12-10 | Discharge: 2020-12-10 | Disposition: A | Discharge status: Home / Self Care | Payer: No Typology Code available for payment source | Source: Ambulatory Visit | Attending: Orthopedic Surgery | Admitting: Orthopedic Surgery

## 2020-12-10 ENCOUNTER — Other Ambulatory Visit: Payer: Self-pay

## 2020-12-10 ENCOUNTER — Encounter (HOSPITAL_COMMUNITY): Payer: Self-pay

## 2020-12-10 DIAGNOSIS — Z01818 Encounter for other preprocedural examination: Secondary | ICD-10-CM | POA: Diagnosis present

## 2020-12-10 HISTORY — DX: Post-traumatic stress disorder, unspecified: F43.10

## 2020-12-10 LAB — APTT: aPTT: 31 seconds (ref 24–36)

## 2020-12-10 LAB — COMPREHENSIVE METABOLIC PANEL
ALT: 37 U/L (ref 0–44)
AST: 26 U/L (ref 15–41)
Albumin: 4.2 g/dL (ref 3.5–5.0)
Alkaline Phosphatase: 88 U/L (ref 38–126)
Anion gap: 7 (ref 5–15)
BUN: 24 mg/dL — ABNORMAL HIGH (ref 6–20)
CO2: 26 mmol/L (ref 22–32)
Calcium: 9 mg/dL (ref 8.9–10.3)
Chloride: 110 mmol/L (ref 98–111)
Creatinine, Ser: 1.02 mg/dL — ABNORMAL HIGH (ref 0.44–1.00)
GFR, Estimated: >60 mL/min (ref >60)
Glucose, Bld: 127 mg/dL — ABNORMAL HIGH (ref 70–99)
Potassium: 3.9 mmol/L (ref 3.5–5.1)
Sodium: 143 mmol/L (ref 135–145)
Total Bilirubin: 0.7 mg/dL (ref 0.3–1.2)
Total Protein: 7 g/dL (ref 6.5–8.1)

## 2020-12-10 LAB — SURGICAL PCR SCREEN
MRSA, PCR: NEGATIVE
Staphylococcus aureus: NEGATIVE

## 2020-12-10 LAB — CBC
HCT: 39.2 % (ref 36.0–46.0)
Hemoglobin: 12.3 g/dL (ref 12.0–15.0)
MCH: 27.3 pg (ref 26.0–34.0)
MCHC: 31.4 g/dL (ref 30.0–36.0)
MCV: 86.9 fL (ref 80.0–100.0)
Platelets: 289 10*3/uL (ref 150–400)
RBC: 4.51 MIL/uL (ref 3.87–5.11)
RDW: 14.3 % (ref 11.5–15.5)
WBC: 5.3 10*3/uL (ref 4.0–10.5)
nRBC: 0 % (ref 0.0–0.2)

## 2020-12-10 LAB — PROTIME-INR
INR: 1 (ref 0.8–1.2)
Prothrombin Time: 13.2 seconds (ref 11.4–15.2)

## 2020-12-10 NOTE — Progress Notes (Signed)
COVID Vaccine Completed: Yes Date COVID Vaccine completed: 07/2020 [place] COVID vaccine manufacturer: Pfizer      PCP - Dr. Osker Mason. Cardiologist -   Chest x-ray -  EKG -  Stress Test -  ECHO -  Cardiac Cath -  Pacemaker/ICD device last checked:  Sleep Study -  CPAP -   Fasting Blood Sugar -  Checks Blood Sugar _____ times a day  Blood Thinner Instructions: Aspirin Instructions: Last Dose:  Anesthesia review: Hx: CAD,HTN,Pre-DIA.  Patient denies shortness of breath, fever, cough and chest pain at PAT appointment   Patient verbalized understanding of instructions that were given to them at the PAT appointment. Patient was also instructed that they will need to review over the PAT instructions again at home before surgery.

## 2020-12-11 LAB — HEMOGLOBIN A1C
Hgb A1c MFr Bld: 6.8 % — ABNORMAL HIGH (ref 4.8–5.6)
Mean Plasma Glucose: 148 mg/dL

## 2020-12-12 NOTE — Progress Notes (Incomplete)
Anesthesia Chart Review   Case: [id number] Date/Time: 12/19/20 1526   Procedure: TOTAL HIP ARTHROPLASTY ANTERIOR APPROACH (Left Hip) - 156min   Anesthesia type: Choice   Pre-op diagnosis: left hip osteoarthritis   [facility] ROOM 09 / [facility]   Surgeons: Gaynelle Arabian, MD      DISCUSSION:60 y.o. never smoker with h/o GERD, HTN, CAD, pre-diabetes, left hip OA scheduled for above procedure 12/19/20 with Dr. Gaynelle Arabian.   VS: BP (!) 141/78   Pulse 99   Temp 37.1 C (Oral)   Ht 5\' 8"  (1.727 m)   Wt 122.5 kg   SpO2 98%   BMI 41.05 kg/m   PROVIDERS: Osker Mason, MD is PCP    LABS: Labs reviewed: Acceptable for surgery. (all labs ordered are listed, but only abnormal results are displayed)  Labs Reviewed  HEMOGLOBIN A1C - Abnormal; Notable for the following components:      Result Value   Hgb A1c MFr Bld 6.8 (*)    All other components within normal limits  COMPREHENSIVE METABOLIC PANEL - Abnormal; Notable for the following components:   Glucose, Bld 127 (*)    BUN 24 (*)    Creatinine, Ser 1.02 (*)    All other components within normal limits  SURGICAL PCR SCREEN  CBC  PROTIME-INR  APTT  TYPE AND SCREEN     IMAGES:   EKG: 12/10/2020 Rate 91 bpm  Normal sinus rhythm Possible Left atrial enlargement Nonspecific T wave abnormality Abnormal ECG Since last tracing rate faster  CV:  Past Medical History:  Diagnosis Date  . Coronary artery disease CARDIOLOGIST-- DR Marlou Porch--  WILL REQUEST NOTE   MILD NON-OBSTRUCTIVE CAD  . GERD (gastroesophageal reflux disease)   . H/O hiatal hernia   . History of carpal tunnel syndrome    Bilateral  . History of iron deficiency anemia   . History of MRSA infection   . Hypertension   . Impingement syndrome of left shoulder   . Knee pain    r/knee  . OA (osteoarthritis)   . Pre-diabetes   . PTSD (post-traumatic stress disorder)   . Severe major depression with psychotic features [facility])     Past Surgical History:   Procedure Laterality Date  . BILATERAL CARPAL TUNNEL RELEASE  2006  &  2009  . CARDIAC CATHETERIZATION  01-20-2008   DR Marlou Porch   MILD CAD/ RCA WITH UP TO 50% PROXIMAL /  DIFFUSE DISTAL IRREGULARITIES UP TO 30%/ NORMAL LVEF  . COLONOSCOPY    . epidural steroid injections    . SHOULDER SURGERY     Left  . TONSILLECTOMY AND ADENOIDECTOMY  AS CHILD   AND PALETTE REPAIR  . TOTAL ABDOMINAL HYSTERECTOMY W/ BILATERAL SALPINGOOPHORECTOMY  2001  . TOTAL KNEE ARTHROPLASTY Right 07/25/2019   Procedure: TOTAL KNEE ARTHROPLASTY;  Surgeon: Gaynelle Arabian, MD;  Location[facility];  Service: Orthopedics;  Laterality: Right;  17min  . UPPER GI ENDOSCOPY      MEDICATIONS: . amLODipine (NORVASC) 10 MG tablet  . aspirin EC 81 MG tablet  . atorvastatin (LIPITOR) 80 MG tablet  . buPROPion (WELLBUTRIN XL) 150 MG 24 hr tablet  . cholecalciferol (VITAMIN D) 25 MCG (1000 UT) tablet  . cyclobenzaprine (FLEXERIL) 10 MG tablet  . gabapentin (NEURONTIN) 300 MG capsule  . hydrOXYzine (ATARAX/VISTARIL) 25 MG tablet  . methocarbamol (ROBAXIN) 500 MG tablet  . oxyCODONE (OXY IR/ROXICODONE) 5 MG immediate release tablet  . pantoprazole (PROTONIX) 40 MG tablet  . Phentermine-Topiramate (  QSYMIA) 15-92 MG CP24  . prazosin (MINIPRESS) 1 MG capsule  . QUEtiapine (SEROQUEL) 200 MG tablet  . QUEtiapine (SEROQUEL) 50 MG tablet  . sertraline (ZOLOFT) 50 MG tablet  . sucralfate (CARAFATE) 1 g tablet   No current facility-administered medications for this encounter.

## 2020-12-15 ENCOUNTER — Other Ambulatory Visit (HOSPITAL_COMMUNITY)
Admission: RE | Admit: 2020-12-15 | Discharge: 2020-12-15 | Disposition: A | Discharge status: Home / Self Care | Payer: No Typology Code available for payment source | Source: Ambulatory Visit | Attending: Orthopedic Surgery | Admitting: Orthopedic Surgery

## 2020-12-15 DIAGNOSIS — Z01812 Encounter for preprocedural laboratory examination: Secondary | ICD-10-CM | POA: Diagnosis present

## 2020-12-15 DIAGNOSIS — Z20822 Contact with and (suspected) exposure to covid-19: Secondary | ICD-10-CM | POA: Insufficient documentation

## 2020-12-15 LAB — SARS CORONAVIRUS 2 (TAT 6-24 HRS): SARS Coronavirus 2: NEGATIVE

## 2020-12-18 MED ORDER — DEXTROSE 5 % IV SOLN
3.0000 g | INTRAVENOUS | Status: AC
  Administered 2020-12-19: 3 g via INTRAVENOUS
  Filled 2020-12-18: qty 3

## 2020-12-19 ENCOUNTER — Encounter (HOSPITAL_COMMUNITY): Payer: Self-pay | Admitting: Orthopedic Surgery

## 2020-12-19 ENCOUNTER — Ambulatory Visit (HOSPITAL_COMMUNITY): Payer: No Typology Code available for payment source | Admitting: Anesthesiology

## 2020-12-19 ENCOUNTER — Ambulatory Visit (HOSPITAL_COMMUNITY): Payer: No Typology Code available for payment source | Admitting: Physician Assistant

## 2020-12-19 ENCOUNTER — Observation Stay (HOSPITAL_COMMUNITY): Payer: No Typology Code available for payment source

## 2020-12-19 ENCOUNTER — Observation Stay (HOSPITAL_COMMUNITY)
Admission: RE | Admit: 2020-12-19 | Discharge: 2020-12-20 | Disposition: A | Discharge status: Home / Self Care | Payer: No Typology Code available for payment source | Attending: Orthopedic Surgery | Admitting: Orthopedic Surgery

## 2020-12-19 ENCOUNTER — Encounter (HOSPITAL_COMMUNITY)
Admission: RE | Disposition: A | Discharge status: Home / Self Care | Payer: Self-pay | Source: Home / Self Care | Attending: Orthopedic Surgery

## 2020-12-19 ENCOUNTER — Ambulatory Visit (HOSPITAL_COMMUNITY): Payer: No Typology Code available for payment source

## 2020-12-19 DIAGNOSIS — I10 Essential (primary) hypertension: Secondary | ICD-10-CM | POA: Insufficient documentation

## 2020-12-19 DIAGNOSIS — Z79899 Other long term (current) drug therapy: Secondary | ICD-10-CM | POA: Diagnosis not present

## 2020-12-19 DIAGNOSIS — M25552 Pain in left hip: Secondary | ICD-10-CM | POA: Diagnosis present

## 2020-12-19 DIAGNOSIS — Z7982 Long term (current) use of aspirin: Secondary | ICD-10-CM | POA: Diagnosis not present

## 2020-12-19 DIAGNOSIS — Z96651 Presence of right artificial knee joint: Secondary | ICD-10-CM | POA: Insufficient documentation

## 2020-12-19 DIAGNOSIS — M1612 Unilateral primary osteoarthritis, left hip: Principal | ICD-10-CM | POA: Diagnosis present

## 2020-12-19 DIAGNOSIS — I2510 Atherosclerotic heart disease of native coronary artery without angina pectoris: Secondary | ICD-10-CM | POA: Diagnosis not present

## 2020-12-19 DIAGNOSIS — Z96649 Presence of unspecified artificial hip joint: Secondary | ICD-10-CM

## 2020-12-19 DIAGNOSIS — Z419 Encounter for procedure for purposes other than remedying health state, unspecified: Secondary | ICD-10-CM

## 2020-12-19 DIAGNOSIS — M169 Osteoarthritis of hip, unspecified: Secondary | ICD-10-CM | POA: Diagnosis present

## 2020-12-19 HISTORY — PX: TOTAL HIP ARTHROPLASTY: SHX124

## 2020-12-19 LAB — TYPE AND SCREEN
ABO/RH(D): A POS
Antibody Screen: NEGATIVE

## 2020-12-19 SURGERY — ARTHROPLASTY, HIP, TOTAL, ANTERIOR APPROACH
Anesthesia: Monitor Anesthesia Care | Site: Hip | Laterality: Left

## 2020-12-19 MED ORDER — LACTATED RINGERS IV SOLN: INTRAVENOUS | Status: DC

## 2020-12-19 MED ORDER — FENTANYL CITRATE (PF) 100 MCG/2ML IJ SOLN
INTRAMUSCULAR | Status: AC
  Filled 2020-12-19: qty 2

## 2020-12-19 MED ORDER — TRANEXAMIC ACID-NACL 1000-0.7 MG/100ML-% IV SOLN
1000.0000 mg | INTRAVENOUS | Status: AC
  Administered 2020-12-19: 1000 mg via INTRAVENOUS
  Filled 2020-12-19: qty 100

## 2020-12-19 MED ORDER — WATER FOR IRRIGATION, STERILE IR SOLN
Status: DC | PRN
  Administered 2020-12-19: 1000 mL

## 2020-12-19 MED ORDER — ASPIRIN EC 325 MG PO TBEC
325.0000 mg | DELAYED_RELEASE_TABLET | Freq: Two times a day (BID) | ORAL | Status: DC
  Administered 2020-12-20: 325 mg via ORAL
  Filled 2020-12-19: qty 1

## 2020-12-19 MED ORDER — METHOCARBAMOL 500 MG PO TABS
500.0000 mg | ORAL_TABLET | Freq: Four times a day (QID) | ORAL | Status: DC | PRN
  Administered 2020-12-20: 500 mg via ORAL
  Filled 2020-12-19 (×2): qty 1

## 2020-12-19 MED ORDER — SUGAMMADEX SODIUM 200 MG/2ML IV SOLN
INTRAVENOUS | Status: DC | PRN
  Administered 2020-12-19: 200 mg via INTRAVENOUS

## 2020-12-19 MED ORDER — MIDAZOLAM HCL 2 MG/2ML IJ SOLN
INTRAMUSCULAR | Status: AC
  Filled 2020-12-19: qty 2

## 2020-12-19 MED ORDER — HYDROCODONE-ACETAMINOPHEN 5-325 MG PO TABS
1.0000 | ORAL_TABLET | ORAL | Status: DC | PRN
  Administered 2020-12-19 – 2020-12-20 (×4): 2 via ORAL
  Filled 2020-12-19 (×4): qty 2

## 2020-12-19 MED ORDER — MENTHOL 3 MG MT LOZG: 1.0000 | LOZENGE | OROMUCOSAL | Status: DC | PRN

## 2020-12-19 MED ORDER — CHLORHEXIDINE GLUCONATE 0.12 % MT SOLN
15.0000 mL | Freq: Once | OROMUCOSAL | Status: AC
  Administered 2020-12-19: 15 mL via OROMUCOSAL

## 2020-12-19 MED ORDER — ORAL CARE MOUTH RINSE: 15.0000 mL | Freq: Once | OROMUCOSAL | Status: AC

## 2020-12-19 MED ORDER — FENTANYL CITRATE (PF) 100 MCG/2ML IJ SOLN
INTRAMUSCULAR | Status: DC | PRN
  Administered 2020-12-19 (×6): 50 ug via INTRAVENOUS

## 2020-12-19 MED ORDER — POLYETHYLENE GLYCOL 3350 17 G PO PACK: 17.0000 g | PACK | Freq: Every day | ORAL | Status: DC | PRN

## 2020-12-19 MED ORDER — SODIUM CHLORIDE 0.9 % IV SOLN: INTRAVENOUS | Status: DC

## 2020-12-19 MED ORDER — PROPOFOL 500 MG/50ML IV EMUL
INTRAVENOUS | Status: DC | PRN
  Administered 2020-12-19: 40 ug/kg/min via INTRAVENOUS

## 2020-12-19 MED ORDER — ONDANSETRON HCL 4 MG/2ML IJ SOLN: 4.0000 mg | Freq: Four times a day (QID) | INTRAMUSCULAR | Status: DC | PRN

## 2020-12-19 MED ORDER — ACETAMINOPHEN 325 MG PO TABS: 325.0000 mg | ORAL_TABLET | Freq: Four times a day (QID) | ORAL | Status: DC | PRN

## 2020-12-19 MED ORDER — HYDROXYZINE HCL 25 MG PO TABS: 25.0000 mg | ORAL_TABLET | Freq: Two times a day (BID) | ORAL | Status: DC | PRN

## 2020-12-19 MED ORDER — BUPIVACAINE-EPINEPHRINE (PF) 0.25% -1:200000 IJ SOLN
INTRAMUSCULAR | Status: AC
  Filled 2020-12-19: qty 30

## 2020-12-19 MED ORDER — BUPROPION HCL ER (XL) 150 MG PO TB24
450.0000 mg | ORAL_TABLET | Freq: Every day | ORAL | Status: DC
  Administered 2020-12-20: 450 mg via ORAL
  Filled 2020-12-19: qty 3

## 2020-12-19 MED ORDER — GABAPENTIN 300 MG PO CAPS
300.0000 mg | ORAL_CAPSULE | Freq: Two times a day (BID) | ORAL | Status: DC
  Administered 2020-12-20: 300 mg via ORAL
  Filled 2020-12-19 (×2): qty 1

## 2020-12-19 MED ORDER — MORPHINE SULFATE (PF) 2 MG/ML IV SOLN: 0.5000 mg | INTRAVENOUS | Status: DC | PRN

## 2020-12-19 MED ORDER — BUPIVACAINE-EPINEPHRINE (PF) 0.25% -1:200000 IJ SOLN
INTRAMUSCULAR | Status: DC | PRN
  Administered 2020-12-19: 30 mL via PERINEURAL

## 2020-12-19 MED ORDER — ATORVASTATIN CALCIUM 40 MG PO TABS: 80.0000 mg | ORAL_TABLET | Freq: Every day | ORAL | Status: DC

## 2020-12-19 MED ORDER — PHENOL 1.4 % MT LIQD: 1.0000 | OROMUCOSAL | Status: DC | PRN

## 2020-12-19 MED ORDER — ONDANSETRON HCL 4 MG PO TABS
4.0000 mg | ORAL_TABLET | Freq: Four times a day (QID) | ORAL | Status: DC | PRN
  Administered 2020-12-20: 4 mg via ORAL
  Filled 2020-12-19: qty 1

## 2020-12-19 MED ORDER — PANTOPRAZOLE SODIUM 40 MG PO TBEC
40.0000 mg | DELAYED_RELEASE_TABLET | Freq: Every day | ORAL | Status: DC
  Administered 2020-12-20: 40 mg via ORAL
  Filled 2020-12-19: qty 1

## 2020-12-19 MED ORDER — BISACODYL 10 MG RE SUPP: 10.0000 mg | Freq: Every day | RECTAL | Status: DC | PRN

## 2020-12-19 MED ORDER — OXYCODONE HCL 5 MG PO TABS: 5.0000 mg | ORAL_TABLET | Freq: Once | ORAL | Status: DC | PRN

## 2020-12-19 MED ORDER — PROMETHAZINE HCL 25 MG/ML IJ SOLN: 6.2500 mg | INTRAMUSCULAR | Status: DC | PRN

## 2020-12-19 MED ORDER — OXYCODONE HCL 5 MG/5ML PO SOLN: 5.0000 mg | Freq: Once | ORAL | Status: DC | PRN

## 2020-12-19 MED ORDER — DEXAMETHASONE SODIUM PHOSPHATE 10 MG/ML IJ SOLN
10.0000 mg | Freq: Once | INTRAMUSCULAR | Status: AC
  Administered 2020-12-20: 10 mg via INTRAVENOUS
  Filled 2020-12-19: qty 1

## 2020-12-19 MED ORDER — ATORVASTATIN CALCIUM 40 MG PO TABS
80.0000 mg | ORAL_TABLET | Freq: Every day | ORAL | Status: DC
  Administered 2020-12-19: 80 mg via ORAL
  Filled 2020-12-19: qty 2

## 2020-12-19 MED ORDER — METOCLOPRAMIDE HCL 5 MG PO TABS: 5.0000 mg | ORAL_TABLET | Freq: Three times a day (TID) | ORAL | Status: DC | PRN

## 2020-12-19 MED ORDER — MIDAZOLAM HCL 5 MG/5ML IJ SOLN
INTRAMUSCULAR | Status: DC | PRN
  Administered 2020-12-19: 2 mg via INTRAVENOUS

## 2020-12-19 MED ORDER — PROPOFOL 1000 MG/100ML IV EMUL
INTRAVENOUS | Status: AC
  Filled 2020-12-19: qty 100

## 2020-12-19 MED ORDER — DOCUSATE SODIUM 100 MG PO CAPS
100.0000 mg | ORAL_CAPSULE | Freq: Two times a day (BID) | ORAL | Status: DC
  Administered 2020-12-19 – 2020-12-20 (×2): 100 mg via ORAL
  Filled 2020-12-19 (×2): qty 1

## 2020-12-19 MED ORDER — PROPOFOL 10 MG/ML IV BOLUS
INTRAVENOUS | Status: DC | PRN
  Administered 2020-12-19: 150 mg via INTRAVENOUS

## 2020-12-19 MED ORDER — SERTRALINE HCL 50 MG PO TABS
150.0000 mg | ORAL_TABLET | Freq: Every day | ORAL | Status: DC
Start: 2020-12-20 — End: 2020-12-20
  Administered 2020-12-20: 150 mg via ORAL
  Filled 2020-12-19: qty 3

## 2020-12-19 MED ORDER — MAGNESIUM CITRATE PO SOLN: 1.0000 | Freq: Once | ORAL | Status: DC | PRN

## 2020-12-19 MED ORDER — ROCURONIUM BROMIDE 100 MG/10ML IV SOLN
INTRAVENOUS | Status: DC | PRN
  Administered 2020-12-19: 70 mg via INTRAVENOUS

## 2020-12-19 MED ORDER — 0.9 % SODIUM CHLORIDE (POUR BTL) OPTIME
TOPICAL | Status: DC | PRN
  Administered 2020-12-19: 1000 mL

## 2020-12-19 MED ORDER — METHOCARBAMOL 500 MG IVPB - SIMPLE MED
500.0000 mg | Freq: Four times a day (QID) | INTRAVENOUS | Status: DC | PRN
  Filled 2020-12-19: qty 50

## 2020-12-19 MED ORDER — PRAZOSIN HCL 1 MG PO CAPS
1.0000 mg | ORAL_CAPSULE | Freq: Every day | ORAL | Status: DC
  Filled 2020-12-19: qty 1

## 2020-12-19 MED ORDER — FENTANYL CITRATE (PF) 100 MCG/2ML IJ SOLN
25.0000 ug | INTRAMUSCULAR | Status: DC | PRN
  Administered 2020-12-19: 25 ug via INTRAVENOUS

## 2020-12-19 MED ORDER — ACETAMINOPHEN 10 MG/ML IV SOLN
1000.0000 mg | Freq: Four times a day (QID) | INTRAVENOUS | Status: DC
  Administered 2020-12-19: 1000 mg via INTRAVENOUS
  Filled 2020-12-19: qty 100

## 2020-12-19 MED ORDER — AMLODIPINE BESYLATE 5 MG PO TABS
5.0000 mg | ORAL_TABLET | Freq: Every day | ORAL | Status: DC
  Administered 2020-12-20: 5 mg via ORAL
  Filled 2020-12-19: qty 1

## 2020-12-19 MED ORDER — ONDANSETRON HCL 4 MG/2ML IJ SOLN
INTRAMUSCULAR | Status: DC | PRN
  Administered 2020-12-19: 4 mg via INTRAVENOUS

## 2020-12-19 MED ORDER — QUETIAPINE FUMARATE 50 MG PO TABS
250.0000 mg | ORAL_TABLET | Freq: Every day | ORAL | Status: DC
  Filled 2020-12-19: qty 5

## 2020-12-19 MED ORDER — DEXAMETHASONE SODIUM PHOSPHATE 10 MG/ML IJ SOLN
8.0000 mg | Freq: Once | INTRAMUSCULAR | Status: AC
  Administered 2020-12-19: 8 mg via INTRAVENOUS

## 2020-12-19 MED ORDER — HYDROCODONE-ACETAMINOPHEN 7.5-325 MG PO TABS
1.0000 | ORAL_TABLET | ORAL | Status: DC | PRN
  Filled 2020-12-19: qty 2

## 2020-12-19 MED ORDER — METOCLOPRAMIDE HCL 5 MG/ML IJ SOLN: 5.0000 mg | Freq: Three times a day (TID) | INTRAMUSCULAR | Status: DC | PRN

## 2020-12-19 MED ORDER — POVIDONE-IODINE 10 % EX SWAB
2.0000 "application " | Freq: Once | CUTANEOUS | Status: AC
  Administered 2020-12-19: 2 via TOPICAL

## 2020-12-19 MED ORDER — AMISULPRIDE (ANTIEMETIC) 5 MG/2ML IV SOLN: 10.0000 mg | Freq: Once | INTRAVENOUS | Status: DC | PRN

## 2020-12-19 MED ORDER — QUETIAPINE FUMARATE 50 MG PO TABS: 50.0000 mg | ORAL_TABLET | Freq: Every day | ORAL | Status: DC

## 2020-12-19 MED ORDER — ACETAMINOPHEN 500 MG PO TABS: 1000.0000 mg | ORAL_TABLET | Freq: Once | ORAL | Status: DC

## 2020-12-19 MED ORDER — CEFAZOLIN SODIUM-DEXTROSE 2-4 GM/100ML-% IV SOLN
2.0000 g | Freq: Four times a day (QID) | INTRAVENOUS | Status: AC
  Administered 2020-12-19 – 2020-12-20 (×2): 2 g via INTRAVENOUS
  Filled 2020-12-19 (×2): qty 100

## 2020-12-19 MED ORDER — METHOCARBAMOL 500 MG IVPB - SIMPLE MED
INTRAVENOUS | Status: AC
  Administered 2020-12-19: 500 mg via INTRAVENOUS
  Filled 2020-12-19: qty 50

## 2020-12-19 SURGICAL SUPPLY — 48 items
BAG DECANTER FOR FLEXI CONT (MISCELLANEOUS) IMPLANT
BAG SPEC THK2 15X12 ZIP CLS (MISCELLANEOUS)
BAG ZIPLOCK 12X15 (MISCELLANEOUS) IMPLANT
BLADE SAG 18X100X1.27 (BLADE) ×2 IMPLANT
CLSR STERI-STRIP ANTIMIC 1/2X4 (GAUZE/BANDAGES/DRESSINGS) ×1 IMPLANT
COVER PERINEAL POST (MISCELLANEOUS) ×2 IMPLANT
COVER SURGICAL LIGHT HANDLE (MISCELLANEOUS) ×2 IMPLANT
COVER WAND RF STERILE (DRAPES) IMPLANT
CUP ACET PINNACLE SECTR 50MM (Hips) IMPLANT
DECANTER SPIKE VIAL GLASS SM (MISCELLANEOUS) ×2 IMPLANT
DRAPE STERI IOBAN 125X83 (DRAPES) ×2 IMPLANT
DRAPE U-SHAPE 47X51 STRL (DRAPES) ×4 IMPLANT
DRESSING AQUACEL AG SP 3.5X10 (GAUZE/BANDAGES/DRESSINGS) IMPLANT
DRSG AQUACEL AG ADV 3.5X10 (GAUZE/BANDAGES/DRESSINGS) ×2 IMPLANT
DRSG AQUACEL AG SP 3.5X10 (GAUZE/BANDAGES/DRESSINGS) ×2
DURAPREP 26ML APPLICATOR (WOUND CARE) ×2 IMPLANT
ELECT REM PT RETURN 15FT ADLT (MISCELLANEOUS) ×2 IMPLANT
EVACUATOR 1/8 PVC DRAIN (DRAIN) IMPLANT
FEM STEM 12/14 TAPER SZ 4 HIP (Orthopedic Implant) ×2 IMPLANT
FEMORAL STEM 12/14 TPR SZ4 HIP (Orthopedic Implant) IMPLANT
GLOVE SRG 8 PF TXTR STRL LF DI (GLOVE) ×1 IMPLANT
GLOVE SURG ENC MOIS LTX SZ6 (GLOVE) IMPLANT
GLOVE SURG ENC MOIS LTX SZ7 (GLOVE) ×2 IMPLANT
GLOVE SURG ENC MOIS LTX SZ8 (GLOVE) ×2 IMPLANT
GLOVE SURG ENC TEXT LTX SZ7 (GLOVE) IMPLANT
GLOVE SURG UNDER POLY LF SZ6.5 (GLOVE) IMPLANT
GLOVE SURG UNDER POLY LF SZ8 (GLOVE) ×2
GLOVE SURG UNDER POLY LF SZ8.5 (GLOVE) IMPLANT
GOWN STRL REUS W/TWL LRG LVL3 (GOWN DISPOSABLE) ×2 IMPLANT
GOWN STRL REUS W/TWL XL LVL3 (GOWN DISPOSABLE) IMPLANT
HEAD FEMORAL 32 CERAMIC (Hips) ×1 IMPLANT
HOLDER FOLEY CATH W/STRAP (MISCELLANEOUS) ×2 IMPLANT
KIT TURNOVER KIT A (KITS) ×2 IMPLANT
LINER ACET PNNCL PLUS4 NEUTRAL (Hips) IMPLANT
MANIFOLD NEPTUNE II (INSTRUMENTS) ×2 IMPLANT
PACK ANTERIOR HIP CUSTOM (KITS) ×2 IMPLANT
PENCIL SMOKE EVACUATOR COATED (MISCELLANEOUS) ×2 IMPLANT
PINNACLE PLUS 4 NEUTRAL (Hips) ×2 IMPLANT
PINNACLE SECTOR CUP 50MM (Hips) ×2 IMPLANT
STRIP CLOSURE SKIN 1/2X4 (GAUZE/BANDAGES/DRESSINGS) ×2 IMPLANT
SUT ETHIBOND NAB CT1 #1 30IN (SUTURE) ×2 IMPLANT
SUT MNCRL AB 4-0 PS2 18 (SUTURE) ×2 IMPLANT
SUT STRATAFIX 0 PDS 27 VIOLET (SUTURE) ×2
SUT VIC AB 2-0 CT1 27 (SUTURE) ×4
SUT VIC AB 2-0 CT1 TAPERPNT 27 (SUTURE) ×2 IMPLANT
SUTURE STRATFX 0 PDS 27 VIOLET (SUTURE) ×1 IMPLANT
SYR 50ML LL SCALE MARK (SYRINGE) IMPLANT
TRAY FOLEY MTR SLVR 16FR STAT (SET/KITS/TRAYS/PACK) ×2 IMPLANT

## 2020-12-19 NOTE — Anesthesia Procedure Notes (Signed)
Procedure Name: Intubation Date/Time: 12/19/2020 1:44 PM Performed by: Victoriano Lain, CRNA Pre-anesthesia Checklist: Patient identified, Emergency Drugs available, Suction available, Patient being monitored and Timeout performed Patient Re-evaluated:Patient Re-evaluated prior to induction Oxygen Delivery Method: Circle system utilized Preoxygenation: Pre-oxygenation with 100% oxygen Induction Type: IV induction Ventilation: Mask ventilation without difficulty and Oral airway inserted - appropriate to patient size Laryngoscope Size: Mac and 4 Tube type: Oral Tube size: 7.5 mm Number of attempts: 1 Airway Equipment and Method: Stylet Placement Confirmation: ETT inserted through vocal cords under direct vision,  positive ETCO2 and breath sounds checked- equal and bilateral Secured at: 22 cm Tube secured with: Tape Dental Injury: Teeth and Oropharynx as per pre-operative assessment

## 2020-12-19 NOTE — H&P (Signed)
TOTAL HIP ADMISSION H&P  Patient is admitted for left total hip arthroplasty.  Subjective:  Chief Complaint: left hip pain  HPI: [name], 60 y.o. female, has a history of pain and functional disability in the left hip(s) due to arthritis and patient has failed non-surgical conservative treatments for greater than 12 weeks to include NSAID's and/or analgesics, weight reduction as appropriate and activity modification.  Onset of symptoms was gradual starting 1 years ago with gradually worsening course since that time.The patient noted no past surgery on the left hip(s).  Patient currently rates pain in the left hip at 9 out of 10 with activity. Patient has worsening of pain with activity and weight bearing, pain that interfers with activities of daily living and pain with passive range of motion. Patient has evidence of joint space narrowing by imaging studies. This condition presents safety issues increasing the risk of falls.   There is no current active infection.  Patient Active Problem List   Diagnosis Date Noted  . OA (osteoarthritis) of knee 07/25/2019  . Degenerative spondylolisthesis 07/10/2017  . Spondylosis without myelopathy or radiculopathy, lumbar region 07/10/2017  . Morbid obesity with BMI of 40.0-44.9, adult ([place]) 05/01/2016  . Osteoarthritis of lumbar spine 05/01/2016  . Disturbance in sleep behavior 12/20/2015  . Atherosclerosis of native coronary artery of transplanted heart 12/20/2015  . Cardiac chest pain 12/20/2015  . Encounter for screening for malignant neoplasm of colon 12/03/2015  . Impingement syndrome of shoulder 12/03/2015  . HLD (hyperlipidemia) 12/03/2015  . History of abdominal hernia 12/03/2015  . Clinical depression 12/03/2015  . Arteriosclerosis of coronary artery 12/03/2015  . Allergic rhinitis 12/03/2015  . Hypertension   . GERD (gastroesophageal reflux disease)   . H/O hiatal hernia   . Severe major depression with psychotic features ([place])   .  Impingement syndrome of left shoulder   . Coronary artery disease    Past Medical History:  Diagnosis Date  . Coronary artery disease CARDIOLOGIST-- DR Marlou Porch--  WILL REQUEST NOTE   MILD NON-OBSTRUCTIVE CAD  . GERD (gastroesophageal reflux disease)   . H/O hiatal hernia   . History of carpal tunnel syndrome    Bilateral  . History of iron deficiency anemia   . History of MRSA infection   . Hypertension   . Impingement syndrome of left shoulder   . Knee pain    r/knee  . OA (osteoarthritis)   . Pre-diabetes   . PTSD (post-traumatic stress disorder)   . Severe major depression with psychotic features [facility])     Past Surgical History:  Procedure Laterality Date  . BILATERAL CARPAL TUNNEL RELEASE  2006  &  2009  . CARDIAC CATHETERIZATION  01-20-2008   DR Marlou Porch   MILD CAD/ RCA WITH UP TO 50% PROXIMAL /  DIFFUSE DISTAL IRREGULARITIES UP TO 30%/ NORMAL LVEF  . COLONOSCOPY    . epidural steroid injections    . SHOULDER SURGERY     Left  . TONSILLECTOMY AND ADENOIDECTOMY  AS CHILD   AND PALETTE REPAIR  . TOTAL ABDOMINAL HYSTERECTOMY W/ BILATERAL SALPINGOOPHORECTOMY  2001  . TOTAL KNEE ARTHROPLASTY Right 07/25/2019   Procedure: TOTAL KNEE ARTHROPLASTY;  Surgeon: Gaynelle Arabian, MD;  [facility];  Service: Orthopedics;  Laterality: Right;  35min  . UPPER GI ENDOSCOPY      Current Facility-Administered Medications  Medication Dose Route Frequency Provider Last Rate Last Admin  . ceFAZolin (ANCEF) 3 g in dextrose 5 % 50 mL IVPB  3  g Intravenous On Call to OR Gaynelle Arabian, MD       Current Outpatient Medications  Medication Sig Dispense Refill Last Dose  . amLODipine (NORVASC) 10 MG tablet Take 5 mg by mouth daily.     Marland Kitchen aspirin EC 81 MG tablet Take 81 mg by mouth daily. Swallow whole.     Marland Kitchen atorvastatin (LIPITOR) 80 MG tablet Take 80 mg by mouth at bedtime.   1   . buPROPion (WELLBUTRIN XL) 150 MG 24 hr tablet Take 150 mg by mouth 3 (three) times daily.      . cholecalciferol  (VITAMIN D) 25 MCG (1000 UT) tablet Take 1,000 Units by mouth daily.     . cyclobenzaprine (FLEXERIL) 10 MG tablet Take 10 mg by mouth 2 (two) times daily as needed. Muscle spasms  1   . gabapentin (NEURONTIN) 300 MG capsule Take 1 capsule (300 mg total) by mouth 2 (two) times daily.  0   . hydrOXYzine (ATARAX/VISTARIL) 25 MG tablet Take 25 mg by mouth 2 (two) times daily as needed for anxiety.     . methocarbamol (ROBAXIN) 500 MG tablet Take 1 tablet (500 mg total) by mouth every 6 (six) hours as needed for muscle spasms. 40 tablet 0   . pantoprazole (PROTONIX) 40 MG tablet Take 40 mg by mouth daily.     . Phentermine-Topiramate (QSYMIA) 15-92 MG CP24 Take 1 tablet by mouth daily.     . prazosin (MINIPRESS) 1 MG capsule Take 1 mg by mouth at bedtime.     Marland Kitchen QUEtiapine (SEROQUEL) 200 MG tablet Take 200 mg by mouth at bedtime.     Marland Kitchen QUEtiapine (SEROQUEL) 50 MG tablet Take 50 mg by mouth at bedtime.     . sertraline (ZOLOFT) 50 MG tablet Take 50 mg by mouth 3 (three) times daily.     . sucralfate (CARAFATE) 1 g tablet Take 1 g by mouth 4 (four) times daily as needed (acid reflux).      Marland Kitchen oxyCODONE (OXY IR/ROXICODONE) 5 MG immediate release tablet Take 1-2 tablets (5-10 mg total) by mouth every 6 (six) hours as needed for moderate pain (pain score 4-6). (Patient not taking: No sig reported) 56 tablet 0 Not Taking at Unknown time   Allergies  Allergen Reactions  . Adhesive [Tape] Other (See Comments)    Irritation and tears skin    Social History   Tobacco Use  . Smoking status: Never Smoker  . Smokeless tobacco: Never Used  Substance Use Topics  . Alcohol use: No    Family History  Problem Relation Age of Onset  . Hypertension Mother      Review of Systems  Constitutional: Negative for chills and fever.  Respiratory: Negative for cough and shortness of breath.   Cardiovascular: Negative for chest pain.  Gastrointestinal: Negative for nausea and vomiting.  Musculoskeletal: Positive  for arthralgias.    Objective:  Physical Exam Patient is a 60 year old female.  Well nourished and well developed. General: Alert and oriented x3, cooperative and pleasant, no acute distress. Head: normocephalic, atraumatic, neck supple. Eyes: EOMI. Respiratory: breath sounds clear in all fields, no wheezing, rales, or rhonchi.  Musculoskeletal:  Left Hip Exam: The range of motion: Flexion to 100 degrees, Internal Rotation is minimal, External Rotation to 20 degrees, and abduction to 20 degrees with pain on internal rotation, external rotation, and abduction. There is no tenderness over the greater trochanteric bursa.  Calves soft and nontender. Motor function intact in LE. Strength  5/5 LE bilaterally. Neuro: Distal pulses 2+. Sensation to light touch intact in LE.  Vital signs in last 24 hours:    Labs:   Estimated body mass index is 41.05 kg/m as calculated from the following:   Height as of 12/10/20: 5\' 8"  (1.727 m).   Weight as of 12/10/20: 122.5 kg.   Imaging Review Plain radiographs demonstrate severe degenerative joint disease of the left hip(s). The bone quality appears to be adequate for age and reported activity level.      Assessment/Plan:  End stage arthritis, left hip(s)  The patient history, physical examination, clinical judgement of the provider and imaging studies are consistent with end stage degenerative joint disease of the left hip(s) and total hip arthroplasty is deemed medically necessary. The treatment options including medical management, injection therapy, arthroscopy and arthroplasty were discussed at length. The risks and benefits of total hip arthroplasty were presented and reviewed. The risks due to aseptic loosening, infection, stiffness, dislocation/subluxation,  thromboembolic complications and other imponderables were discussed.  The patient acknowledged the explanation, agreed to proceed with the plan and consent was signed. Patient is  being admitted for inpatient treatment for surgery, pain control, PT, OT, prophylactic antibiotics, VTE prophylaxis, progressive ambulation and ADL's and discharge planning.The patient is planning to be discharged home.  Therapy Plans: HEP Disposition: Home with sister Planned DVT Prophylaxis: aspirin 325mg  BID DME needed: none PCP: Dr. Jetta Lout, awaiting clearance TXA: IV Allergies: adhesive tape - rash Anesthesia Concerns: none BMI: 41 Borderline diabetic  Other:  Instructed patient on which medications to discontinue 5 days prior to surgery. Will follow-up in office with Dr. Wynelle Link 2 weeks post-op.   - Patient was instructed on what medications to stop prior to surgery. - Follow-up visit in 2 weeks with Dr. Wynelle Link - Begin physical therapy following surgery - Pre-operative lab work as pre-surgical testing - Prescriptions will be provided in hospital at time of discharge  Griffith Citron, PA-C Orthopedic Surgery [facility] [place] [phone]

## 2020-12-19 NOTE — Op Note (Signed)
OPERATIVE REPORT- TOTAL HIP ARTHROPLASTY   PREOPERATIVE DIAGNOSIS: Osteoarthritis of the Left hip.   POSTOPERATIVE DIAGNOSIS: Osteoarthritis of the Left  hip.   PROCEDURE: Left total hip arthroplasty, anterior approach.   SURGEON: Gaynelle Arabian, MD   ASSISTANT: Fenton Foy, PA-C  ANESTHESIA:  General  ESTIMATED BLOOD LOSS:-450 mL    DRAINS: Hemovac x1.   COMPLICATIONS: None   CONDITION: PACU - hemodynamically stable.   BRIEF CLINICAL NOTE: [name] is a 60 y.o. female who has advanced end-  stage arthritis of their Left  hip with progressively worsening pain and  dysfunction.The patient has failed nonoperative management and presents for  total hip arthroplasty.   PROCEDURE IN DETAIL: After successful administration of spinal  anesthetic, the traction boots for the [facility] bed were placed on both  feet and the patient was placed onto the [facility] bed, boots placed into the leg  holders. The Left hip was then isolated from the perineum with plastic  drapes and prepped and draped in the usual sterile fashion. ASIS and  greater trochanter were marked and a oblique incision was made, starting  at about 1 cm lateral and 2 cm distal to the ASIS and coursing towards  the anterior cortex of the femur. The skin was cut with a 10 blade  through subcutaneous tissue to the level of the fascia overlying the  tensor fascia lata muscle. The fascia was then incised in line with the  incision at the junction of the anterior third and posterior 2/3rd. The  muscle was teased off the fascia and then the interval between the TFL  and the rectus was developed. The Hohmann retractor was then placed at  the top of the femoral neck over the capsule. The vessels overlying the  capsule were cauterized and the fat on top of the capsule was removed.  A Hohmann retractor was then placed anterior underneath the rectus  femoris to give exposure to the entire anterior capsule. A T-shaped   capsulotomy was performed. The edges were tagged and the femoral head  was identified.       Osteophytes are removed off the superior acetabulum.  The femoral neck was then cut in situ with an oscillating saw. Traction  was then applied to the left lower extremity utilizing the [facility]  traction. The femoral head was then removed. Retractors were placed  around the acetabulum and then circumferential removal of the labrum was  performed. Osteophytes were also removed. Reaming starts at 45 mm to  medialize and  Increased in 2 mm increments to 49 mm. We reamed in  approximately 40 degrees of abduction, 20 degrees anteversion. A 50 mm  pinnacle acetabular shell was then impacted in anatomic position under  fluoroscopic guidance with excellent purchase. We did not need to place  any additional dome screws. A 32 mm neutral + 4 marathon liner was then  placed into the acetabular shell.       The femoral lift was then placed along the lateral aspect of the femur  just distal to the vastus ridge. The leg was  externally rotated and capsule  was stripped off the inferior aspect of the femoral neck down to the  level of the lesser trochanter, this was done with electrocautery. The femur was lifted after this was performed. The  leg was then placed in an extended and adducted position essentially delivering the femur. We also removed the capsule superiorly and the piriformis from the piriformis  fossa to gain excellent exposure of the  proximal femur. Rongeur was used to remove some cancellous bone to get  into the lateral portion of the proximal femur for placement of the  initial starter reamer. The starter broaches was placed  the starter broach  and was shown to go down the center of the canal. Broaching  with the Actis system was then performed starting at size 0  coursing  Up to size 4. A size 4 had excellent torsional and rotational  and axial stability. The trial high offset neck was then placed   with a 32 + 1 trial head. The hip was then reduced. We confirmed that  the stem was in the canal both on AP and lateral x-rays. It also has excellent sizing. The hip was reduced with outstanding stability through full extension and full external rotation.. AP pelvis was taken and the leg lengths were measured and found to be equal. Hip was then dislocated again and the femoral head and neck removed. The  femoral broach was removed. Size 4 Actis stem with a high offset  neck was then impacted into the femur following native anteversion. Has  excellent purchase in the canal. Excellent torsional and rotational and  axial stability. It is confirmed to be in the canal on AP and lateral  fluoroscopic views. The 32 + 1 ceramic head was placed and the hip  reduced with outstanding stability. Again AP pelvis was taken and it  confirmed that the leg lengths were equal. The wound was then copiously  irrigated with saline solution and the capsule reattached and repaired  with Ethibond suture. 30 ml of .25% Bupivicaine was  injected into the capsule and into the edge of the tensor fascia lata as well as subcutaneous tissue. The fascia overlying the tensor fascia lata was then closed with a running #1 V-Loc. Subcu was closed with interrupted 2-0 Vicryl and subcuticular running 4-0 Monocryl. Incision was cleaned  and dried. Steri-Strips and a bulky sterile dressing applied. Hemovac  drain was hooked to suction and then the patient was awakened and transported to  recovery in stable condition.        Please note that a surgical assistant was a medical necessity for this procedure to perform it in a safe and expeditious manner. Assistant was necessary to provide appropriate retraction of vital neurovascular structures and to prevent femoral fracture and allow for anatomic placement of the prosthesis.  Gaynelle Arabian, M.D.

## 2020-12-19 NOTE — Interval H&P Note (Signed)
History and Physical Interval Note:  12/19/2020 12:27 PM  [name]  has presented today for surgery, with the diagnosis of left hip osteoarthritis.  The various methods of treatment have been discussed with the patient and family. After consideration of risks, benefits and other options for treatment, the patient has consented to  Procedure(s) with comments: [place] (Left) - 177min as a surgical intervention.  The patient's history has been reviewed, patient examined, no change in status, stable for surgery.  I have reviewed the patient's chart and labs.  Questions were answered to the patient's satisfaction.     Pilar Plate [name]

## 2020-12-19 NOTE — Anesthesia Postprocedure Evaluation (Signed)
Anesthesia Post Note  Patient: [name]  Procedure(s) Performed: TOTAL HIP ARTHROPLASTY ANTERIOR APPROACH (Left Hip)     Patient location during evaluation: PACU Anesthesia Type: General Level of consciousness: awake Pain management: pain level controlled Vital Signs Assessment: post-procedure vital signs reviewed and stable Respiratory status: spontaneous breathing and respiratory function stable Cardiovascular status: stable Postop Assessment: no apparent nausea or vomiting Anesthetic complications: no   No complications documented.  Last Vitals:  Vitals:   12/19/20 1654 12/19/20 1715  BP: 126/60 (!) 119/55  Pulse: 85 80  Resp: (!) 23 20  Temp:  36.4 C  SpO2: 95% 100%    Last Pain:  Vitals:   12/19/20 1745  TempSrc:   PainSc: 5                  Candra R Grasiela Jonsson

## 2020-12-19 NOTE — Anesthesia Preprocedure Evaluation (Addendum)
Anesthesia Evaluation  Patient identified by MRN, date of birth, ID band Patient awake    Reviewed: Allergy & Precautions, NPO status , Patient's Chart, lab work & pertinent test results  Airway Mallampati: II  TM Distance: >3 FB Neck ROM: Full    Dental  (+) Upper Dentures, Lower Dentures   Pulmonary neg pulmonary ROS,    breath sounds clear to auscultation       Cardiovascular Exercise Tolerance: Good hypertension, Pt. on medications + CAD   Rhythm:Regular Rate:Normal     Neuro/Psych PSYCHIATRIC DISORDERS Anxiety Depression negative neurological ROS     GI/Hepatic Neg liver ROS, hiatal hernia, GERD  Medicated,  Endo/Other  negative endocrine ROS  Renal/GU negative Renal ROS  negative genitourinary   Musculoskeletal  (+) Arthritis , Osteoarthritis,    Abdominal (+) + obese,   Peds negative pediatric ROS (+)  Hematology negative hematology ROS (+)   Anesthesia Other Findings   Reproductive/Obstetrics negative OB ROS                            Lab Results  Component Value Date   WBC 5.3 12/10/2020   HGB 12.3 12/10/2020   HCT 39.2 12/10/2020   MCV 86.9 12/10/2020   PLT 289 12/10/2020   Lab Results  Component Value Date   CREATININE 1.02 (H) 12/10/2020   BUN 24 (H) 12/10/2020   NA 143 12/10/2020   K 3.9 12/10/2020   CL 110 12/10/2020   CO2 26 12/10/2020   Lab Results  Component Value Date   INR 1.0 12/10/2020   INR 1.0 07/20/2019   INR 1.0 01/20/2008     Anesthesia Physical  Anesthesia Plan  ASA: III  Anesthesia Plan: Spinal and MAC   Post-op Pain Management:    Induction: Intravenous  PONV Risk Score and Plan: Propofol infusion, Midazolam, TIVA, Treatment may vary due to age or medical condition and Ondansetron  Airway Management Planned: Natural Airway and Simple Face Mask  Additional Equipment: None  Intra-op Plan:   Post-operative Plan:   Informed  Consent: I have reviewed the patients History and Physical, chart, labs and discussed the procedure including the risks, benefits and alternatives for the proposed anesthesia with the patient or authorized representative who has indicated his/her understanding and acceptance.       Plan Discussed with: CRNA and Anesthesiologist  Anesthesia Plan Comments:        Anesthesia Quick Evaluation

## 2020-12-19 NOTE — Discharge Instructions (Signed)
Frank Aluisio, MD Total Joint Specialist [facility] [address] [place], [place] [address] [phone]  ANTERIOR APPROACH TOTAL HIP REPLACEMENT POSTOPERATIVE DIRECTIONS     Hip Rehabilitation, Guidelines Following Surgery  The results of a hip operation are greatly improved after range of motion and muscle strengthening exercises. Follow all safety measures which are given to protect your hip. If any of these exercises cause increased pain or swelling in your joint, decrease the amount until you are comfortable again. Then slowly increase the exercises. Call your caregiver if you have problems or questions.   BLOOD CLOT PREVENTION . Take a 325 mg Aspirin two times a day for three weeks following surgery. Then take an 81 mg Aspirin once a day for three weeks. Then discontinue Aspirin. . You may resume your vitamins/supplements upon discharge from the hospital. . Do not take any NSAIDs (Advil, Aleve, Ibuprofen, Meloxicam, etc.) until you have discontinued the 325 mg Aspirin.  HOME CARE INSTRUCTIONS  . Remove items at home which could result in a fall. This includes throw rugs or furniture in walking pathways.   ICE to the affected hip as frequently as 20-30 minutes an hour and then as needed for pain and swelling. Continue to use ice on the hip for pain and swelling from surgery. You may notice swelling that will progress down to the foot and ankle. This is normal after surgery. Elevate the leg when you are not up walking on it.    Continue to use the breathing machine which will help keep your temperature down.  It is common for your temperature to cycle up and down following surgery, especially at night when you are not up moving around and exerting yourself.  The breathing machine keeps your lungs expanded and your temperature down.  DIET You may resume your previous home diet once your are discharged from the hospital.  DRESSING / WOUND CARE /  SHOWERING . You have an adhesive waterproof bandage over the incision. Leave this in place until your first follow-up appointment. Once you remove this you will not need to place another bandage.  . You may begin showering 3 days following surgery, but do not submerge the incision under water.  ACTIVITY . For the first 3-5 days, it is important to rest and keep the operative leg elevated. You should, as a general rule, rest for 50 minutes and walk/stretch for 10 minutes per hour. After 5 days, you may slowly increase activity as tolerated.  . Perform the exercises you were provided twice a day for about 15-20 minutes each session. Begin these 2 days following surgery. . Walk with your walker as instructed. Use the walker until you are comfortable transitioning to a cane. Walk with the cane in the opposite hand of the operative leg. You may discontinue the cane once you are comfortable and walking steadily. . Avoid periods of inactivity such as sitting longer than an hour when not asleep. This helps prevent blood clots.  . Do not drive a car for 6 weeks or until released by your surgeon.  . Do not drive while taking narcotics.  TED HOSE STOCKINGS Wear the elastic stockings on both legs for three weeks following surgery during the day. You may remove them at night while sleeping.  WEIGHT BEARING Weight bearing as tolerated with assist device (walker, cane, etc) as directed, use it as long as suggested by your surgeon or therapist, typically at least 4-6 weeks.  POSTOPERATIVE CONSTIPATION PROTOCOL Constipation -   defined medically as fewer than three stools per week and severe constipation as less than one stool per week.  One of the most common issues patients have following surgery is constipation.  Even if you have a regular bowel pattern at home, your normal regimen is likely to be disrupted due to multiple reasons following surgery.  Combination of anesthesia, postoperative narcotics, change in  appetite and fluid intake all can affect your bowels.  In order to avoid complications following surgery, here are some recommendations in order to help you during your recovery period.  . Colace (docusate) - Pick up an over-the-counter form of Colace or another stool softener and take twice a day as long as you are requiring postoperative pain medications.  Take with a full glass of water daily.  If you experience loose stools or diarrhea, hold the colace until you stool forms back up.  If your symptoms do not get better within 1 week or if they get worse, check with your doctor. . Dulcolax (bisacodyl) - Pick up over-the-counter and take as directed by the product packaging as needed to assist with the movement of your bowels.  Take with a full glass of water.  Use this product as needed if not relieved by Colace only.  . MiraLax (polyethylene glycol) - Pick up over-the-counter to have on hand.  MiraLax is a solution that will increase the amount of water in your bowels to assist with bowel movements.  Take as directed and can mix with a glass of water, juice, soda, coffee, or tea.  Take if you go more than two days without a movement.Do not use MiraLax more than once per day. Call your doctor if you are still constipated or irregular after using this medication for 7 days in a row.  If you continue to have problems with postoperative constipation, please contact the office for further assistance and recommendations.  If you experience "the worst abdominal pain ever" or develop nausea or vomiting, please contact the office immediatly for further recommendations for treatment.  ITCHING  If you experience itching with your medications, try taking only a single pain pill, or even half a pain pill at a time.  You can also use Benadryl over the counter for itching or also to help with sleep.   MEDICATIONS See your medication summary on the "After Visit Summary" that the nursing staff will review with you  prior to discharge.  You may have some home medications which will be placed on hold until you complete the course of blood thinner medication.  It is important for you to complete the blood thinner medication as prescribed by your surgeon.  Continue your approved medications as instructed at time of discharge.  PRECAUTIONS If you experience chest pain or shortness of breath - call 911 immediately for transfer to the hospital emergency department.  If you develop a fever greater that 101 F, purulent drainage from wound, increased redness or drainage from wound, foul odor from the wound/dressing, or calf pain - CONTACT YOUR SURGEON.                                                   FOLLOW-UP APPOINTMENTS Make sure you keep all of your appointments after your operation with your surgeon and caregivers. You should call the office at the above phone number and   make an appointment for approximately two weeks after the date of your surgery or on the date instructed by your surgeon outlined in the "After Visit Summary".  RANGE OF MOTION AND STRENGTHENING EXERCISES  These exercises are designed to help you keep full movement of your hip joint. Follow your caregiver's or physical therapist's instructions. Perform all exercises about fifteen times, three times per day or as directed. Exercise both hips, even if you have had only one joint replacement. These exercises can be done on a training (exercise) mat, on the floor, on a table or on a bed. Use whatever works the best and is most comfortable for you. Use music or television while you are exercising so that the exercises are a pleasant break in your day. This will make your life better with the exercises acting as a break in routine you can look forward to.  . Lying on your back, slowly slide your foot toward your buttocks, raising your knee up off the floor. Then slowly slide your foot back down until your leg is straight again.  . Lying on your back spread  your legs as far apart as you can without causing discomfort.  . Lying on your side, raise your upper leg and foot straight up from the floor as far as is comfortable. Slowly lower the leg and repeat.  . Lying on your back, tighten up the muscle in the front of your thigh (quadriceps muscles). You can do this by keeping your leg straight and trying to raise your heel off the floor. This helps strengthen the largest muscle supporting your knee.  . Lying on your back, tighten up the muscles of your buttocks both with the legs straight and with the knee bent at a comfortable angle while keeping your heel on the floor.   IF YOU ARE TRANSFERRED TO A SKILLED REHAB FACILITY If the patient is transferred to a skilled rehab facility following release from the hospital, a list of the current medications will be sent to the facility for the patient to continue.  When discharged from the skilled rehab facility, please have the facility set up the patient's Home Health Physical Therapy prior to being released. Also, the skilled facility will be responsible for providing the patient with their medications at time of release from the facility to include their pain medication, the muscle relaxants, and their blood thinner medication. If the patient is still at the rehab facility at time of the two week follow up appointment, the skilled rehab facility will also need to assist the patient in arranging follow up appointment in our office and any transportation needs.  MAKE SURE YOU:  . Understand these instructions.  . Get help right away if you are not doing well or get worse.    DENTAL ANTIBIOTICS:  In most cases prophylactic antibiotics for Dental procdeures after total joint surgery are not necessary.  Exceptions are as follows:  1. History of prior total joint infection  2. Severely immunocompromised (Organ Transplant, cancer chemotherapy, Rheumatoid biologic meds such as Humera)  3. Poorly controlled  diabetes (A1C &gt; 8.0, blood glucose over 200)  If you have one of these conditions, contact your surgeon for an antibiotic prescription, prior to your dental procedure.    Pick up stool softner and laxative for home use following surgery while on pain medications. Do not submerge incision under water. Please use good hand washing techniques while changing dressing each day. May shower starting three days after surgery. Please   use a clean towel to pat the incision dry following showers. Continue to use ice for pain and swelling after surgery. Do not use any lotions or creams on the incision until instructed by your surgeon.  

## 2020-12-19 NOTE — Transfer of Care (Signed)
Immediate Anesthesia Transfer of Care Note  Patient: [name]  Procedure(s) Performed: TOTAL HIP ARTHROPLASTY ANTERIOR APPROACH (Left Hip)  Patient Location: PACU  Anesthesia Type:General  Level of Consciousness: awake, alert , oriented and patient cooperative  Airway & Oxygen Therapy: Patient Spontanous Breathing and Patient connected to face mask oxygen  Post-op Assessment: Report given to RN, Post -op Vital signs reviewed and stable and Patient moving all extremities  Post vital signs: Reviewed and stable  Last Vitals:  Vitals Value Taken Time  BP 104/71 12/19/20 1554  Temp    Pulse 92 12/19/20 1558  Resp 13 12/19/20 1558  SpO2 94 % 12/19/20 1558  Vitals shown include unvalidated device data.  Last Pain:  Vitals:   12/19/20 1234  TempSrc: Oral  PainSc:       Patients Stated Pain Goal: 4 (58/31/67 4255)  Complications: No complications documented.

## 2020-12-20 DIAGNOSIS — M1612 Unilateral primary osteoarthritis, left hip: Secondary | ICD-10-CM | POA: Diagnosis not present

## 2020-12-20 LAB — CBC
HCT: 34.9 % — ABNORMAL LOW (ref 36.0–46.0)
Hemoglobin: 11.1 g/dL — ABNORMAL LOW (ref 12.0–15.0)
MCH: 27.5 pg (ref 26.0–34.0)
MCHC: 31.8 g/dL (ref 30.0–36.0)
MCV: 86.6 fL (ref 80.0–100.0)
Platelets: 242 K/uL (ref 150–400)
RBC: 4.03 MIL/uL (ref 3.87–5.11)
RDW: 13.8 % (ref 11.5–15.5)
WBC: 11.5 K/uL — ABNORMAL HIGH (ref 4.0–10.5)
nRBC: 0 % (ref 0.0–0.2)

## 2020-12-20 LAB — BASIC METABOLIC PANEL
Anion gap: 8 (ref 5–15)
BUN: 14 mg/dL (ref 6–20)
CO2: 26 mmol/L (ref 22–32)
Calcium: 8.7 mg/dL — ABNORMAL LOW (ref 8.9–10.3)
Chloride: 107 mmol/L (ref 98–111)
Creatinine, Ser: 0.92 mg/dL (ref 0.44–1.00)
GFR, Estimated: >60 mL/min (ref >60)
Glucose, Bld: 181 mg/dL — ABNORMAL HIGH (ref 70–99)
Potassium: 4.4 mmol/L (ref 3.5–5.1)
Sodium: 141 mmol/L (ref 135–145)

## 2020-12-20 MED ORDER — ASPIRIN 325 MG PO TBEC: 325.0000 mg | DELAYED_RELEASE_TABLET | Freq: Two times a day (BID) | ORAL | 0 refills | Status: AC

## 2020-12-20 MED ORDER — HYDROCODONE-ACETAMINOPHEN 5-325 MG PO TABS
1.0000 | ORAL_TABLET | Freq: Four times a day (QID) | ORAL | 0 refills | Status: DC | PRN
Start: 2020-12-20 — End: 2022-01-31

## 2020-12-20 MED ORDER — METHOCARBAMOL 500 MG PO TABS: 500.0000 mg | ORAL_TABLET | Freq: Four times a day (QID) | ORAL | 0 refills | Status: DC | PRN

## 2020-12-20 NOTE — Progress Notes (Signed)
Pt ready for discharge. Pt and pts sister verbalize understanding of all discharge instructions. Pt has her own walker and bedside commode at home. To car via wheelchair with all belongings and discharge instructions. All questions answered

## 2020-12-20 NOTE — Evaluation (Signed)
Physical Therapy Evaluation Patient Details Name: [name] MRN: [id number] DOB: 1960/12/07 Today's Date: 12/20/2020   History of Present Illness  Pt s/p L THR and with hx of R TKR, CAD and PTSD  Clinical Impression  Pt s/p L THR and presents with decreased L LE strength/ROM and post op pain limiting functional mobility.  Pt should progress to dc home with family assist.    Follow Up Recommendations Follow surgeon's recommendation for DC plan and follow-up therapies    Equipment Recommendations  None recommended by PT    Recommendations for Other Services       Precautions / Restrictions Precautions Precautions: Fall Restrictions Weight Bearing Restrictions: No Other Position/Activity Restrictions: WBAT      Mobility  Bed Mobility Overal bed mobility: Needs Assistance Bed Mobility: Sit to Supine           General bed mobility comments: cues for sequence and min assist to manage L LE onto bed    Transfers Overall transfer level: Needs assistance Equipment used: Rolling walker (2 wheeled) Transfers: Sit to/from Stand Sit to Stand: Min assist;Min guard         General transfer comment: cues for LE management and use of UEs to self assist  Ambulation/Gait Ambulation/Gait assistance: Min assist;Min guard Gait Distance (Feet): 75 Feet Assistive device: Rolling walker (2 wheeled) Gait Pattern/deviations: Step-to pattern;Step-through pattern;Decreased step length - right;Decreased step length - left;Shuffle;Trunk flexed     General Gait Details: min cues for posture and position from [employer]            Wheelchair Mobility    Modified Rankin (Stroke Patients Only)       Balance Overall balance assessment: Needs assistance Sitting-balance support: No upper extremity supported;Feet supported Sitting balance-Leahy Scale: Good     Standing balance support: No upper extremity supported Standing balance-Leahy Scale: Fair                                Pertinent Vitals/Pain Pain Assessment: 0-10 Pain Score: 5  Pain Location: L hip Pain Descriptors / Indicators: Aching;Sore Pain Intervention(s): Limited activity within patient's tolerance;Monitored during session;Premedicated before session;Ice applied    Home Living Family/patient expects to be discharged to:: Private residence Living Arrangements: Other relatives Available Help at Discharge: Family Type of Home: House Home Access: Stairs to enter Entrance Stairs-Rails: Left Entrance Stairs-Number of Steps: 4 Home Layout: Two level Home Equipment: Environmental consultant - 2 wheels;Cane - single point;Bedside commode      Prior Function Level of Independence: Independent               Hand Dominance        Extremity/Trunk Assessment   Upper Extremity Assessment Upper Extremity Assessment: Overall WFL for tasks assessed    Lower Extremity Assessment Lower Extremity Assessment: LLE deficits/detail LLE Deficits / Details: Strength at hip 2+/5 with AAROM at hip to 80 flex and 10 abd    Cervical / Trunk Assessment Cervical / Trunk Assessment: Normal  Communication   Communication: No difficulties  Cognition Arousal/Alertness: Awake/alert Behavior During Therapy: WFL for tasks assessed/performed Overall Cognitive Status: Within Functional Limits for tasks assessed                                        General Comments      Exercises Total  Joint Exercises Ankle Circles/Pumps: AROM;Both;15 reps;Supine Quad Sets: AROM;Both;10 reps;Supine Heel Slides: AAROM;Left;20 reps;Supine Hip ABduction/ADduction: AAROM;Left;15 reps;Supine Long Arc Quad: AROM;Left;10 reps;Seated   Assessment/Plan    PT Assessment Patient needs continued PT services  PT Problem List Decreased strength;Decreased range of motion;Decreased activity tolerance;Decreased balance;Decreased mobility;Decreased knowledge of use of DME;Pain;Obesity       PT Treatment  Interventions DME instruction;Gait training;Stair training;Functional mobility training;Therapeutic activities;Therapeutic exercise;Patient/family education    PT Goals (Current goals can be found in the Care Plan section)  Acute Rehab PT Goals Patient Stated Goal: Regain IND PT Goal Formulation: With patient Time For Goal Achievement: 12/27/20 Potential to Achieve Goals: Good    Frequency 7X/week   Barriers to discharge        Co-evaluation               AM-PAC PT "6 Clicks" Mobility  Outcome Measure Help needed turning from your back to your side while in a flat bed without using bedrails?: A Little Help needed moving from lying on your back to sitting on the side of a flat bed without using bedrails?: A Little Help needed moving to and from a bed to a chair (including a wheelchair)?: A Little Help needed standing up from a chair using your arms (e.g., wheelchair or bedside chair)?: A Little Help needed to walk in hospital room?: A Little Help needed climbing 3-5 steps with a railing? : A Little 6 Click Score: 18    End of Session Equipment Utilized During Treatment: Gait belt Activity Tolerance: Patient tolerated treatment well Patient left: with call bell/phone within reach;with family/visitor present;in bed Nurse Communication: Mobility status PT Visit Diagnosis: Unsteadiness on feet (R26.81);Difficulty in walking, not elsewhere classified (R26.2)    Time: 6754-4920 PT Time Calculation (min) (ACUTE ONLY): 29 min   Charges:   PT Evaluation $PT Eval Low Complexity: 1 Low PT Treatments $Therapeutic Exercise: 8-22 mins        Debe Coder PT Acute Rehabilitation Services Pager [phone] Office [phone]   Sybil Shrader 12/20/2020, 12:15 PM

## 2020-12-20 NOTE — Progress Notes (Signed)
   Subjective: 1 Day Post-Op Procedure(s) (LRB): TOTAL HIP ARTHROPLASTY ANTERIOR APPROACH (Left) Patient reports pain as mild.   Patient seen in rounds with Dr. Wynelle Link. Patient is well, and has had no acute complaints or problems, states pain is well controlled this morning. Denies chest pain or SOB. No issues overnight. Voiding without difficulty.  We will begin therapy today.   Objective: Vital signs in last 24 hours: Temp:  [97.5 F (36.4 C)-98.7 F (37.1 C)] 98.5 F (36.9 C) (02/24 0517) Pulse Rate:  [80-97] 90 (02/24 0517) Resp:  [15-23] 17 (02/24 0517) BP: (101-145)/(45-84) 127/72 (02/24 0517) SpO2:  [95 %-100 %] 97 % (02/24 0517) Weight:  [122.5 kg] 122.5 kg (02/23 1218)  Intake/Output from previous day:  Intake/Output Summary (Last 24 hours) at 12/20/2020 0807 Last data filed at 12/20/2020 0200 Gross per 24 hour  Intake 2382.5 ml  Output 1050 ml  Net 1332.5 ml     Intake/Output this shift: No intake/output data recorded.  Labs: Recent Labs    12/20/20 0339  HGB 11.1*   Recent Labs    12/20/20 0339  WBC 11.5*  RBC 4.03  HCT 34.9*  PLT 242   Recent Labs    12/20/20 0339  NA 141  K 4.4  CL 107  CO2 26  BUN 14  CREATININE 0.92  GLUCOSE 181*  CALCIUM 8.7*   No results for input(s): LABPT, INR in the last 72 hours.  Exam: General - Patient is Alert and Oriented Extremity - Neurologically intact Neurovascular intact Sensation intact distally Dorsiflexion/Plantar flexion intact Dressing - dressing C/D/I Motor Function - intact, moving foot and toes well on exam.   Past Medical History:  Diagnosis Date  . Coronary artery disease CARDIOLOGIST-- DR Marlou Porch--  WILL REQUEST NOTE   MILD NON-OBSTRUCTIVE CAD  . GERD (gastroesophageal reflux disease)   . H/O hiatal hernia   . History of carpal tunnel syndrome    Bilateral  . History of iron deficiency anemia   . History of MRSA infection   . Hypertension   . Impingement syndrome of left shoulder    . Knee pain    r/knee  . OA (osteoarthritis)   . Pre-diabetes   . PTSD (post-traumatic stress disorder)   . Severe major depression with psychotic features (HCC)     Assessment/Plan: 1 Day Post-Op Procedure(s) (LRB): TOTAL HIP ARTHROPLASTY ANTERIOR APPROACH (Left) Principal Problem:   OA (osteoarthritis) of hip Active Problems:   Osteoarthritis of left hip  Estimated body mass index is 41.05 kg/m as calculated from the following:   Height as of this encounter: 5\' 8"  (1.727 m).   Weight as of this encounter: 122.5 kg. Advance diet Up with therapy D/C IV fluids  DVT Prophylaxis - Aspirin Weight bearing as tolerated. Begin therapy.  Plan is to go Home after hospital stay. Plan for discharge after two sessions of PT if meeting goals with HEP. Follow-up in the office March 8th.  The PDMP database was reviewed today prior to any opioid medications being prescribed to this patient.  Theresa Duty, PA-C Orthopedic Surgery [phone] 12/20/2020, 8:07 AM

## 2020-12-20 NOTE — Progress Notes (Signed)
Physical Therapy Treatment Patient Details Name: [name] MRN: [id number] DOB: 1961-03-14 Today's Date: 12/20/2020    History of Present Illness Pt s/p L THR and with hx of R TKR, CAD and PTSD    PT Comments    Pt progressing well with mobility and eager for dc home this date.  Pt up to ambulate, negotiated stairs, reviewed car tranfers and performed HEP with written instructions provided and reviewed.    Follow Up Recommendations  Follow surgeon's recommendation for DC plan and follow-up therapies     Equipment Recommendations  None recommended by PT    Recommendations for Other Services       Precautions / Restrictions Precautions Precautions: Fall Restrictions Weight Bearing Restrictions: No Other Position/Activity Restrictions: WBAT    Mobility  Bed Mobility Overal bed mobility: Needs Assistance Bed Mobility: Supine to Sit     Supine to sit: Min guard     General bed mobility comments: cues for sequence and min assist to manage L LE onto bed    Transfers Overall transfer level: Needs assistance Equipment used: Rolling walker (2 wheeled) Transfers: Sit to/from Stand Sit to Stand: Min guard;Supervision         General transfer comment: cues for LE management and use of UEs to self assist  Ambulation/Gait Ambulation/Gait assistance: Min guard;Supervision Gait Distance (Feet): 50 Feet Assistive device: Rolling walker (2 wheeled) Gait Pattern/deviations: Step-to pattern;Step-through pattern;Decreased step length - right;Decreased step length - left;Shuffle;Trunk flexed     General Gait Details: min cues for posture and position from RW   Stairs Stairs: Yes Stairs assistance: Min guard Stair Management: Step to pattern;One rail Right;Forwards;With cane Number of Stairs: 5 General stair comments: 5 steps fwd with rail and cane and single step bkwd with RW to use stool into bed   Wheelchair Mobility    Modified Rankin (Stroke Patients Only)        Balance Overall balance assessment: Needs assistance Sitting-balance support: No upper extremity supported;Feet supported Sitting balance-Leahy Scale: Good     Standing balance support: No upper extremity supported Standing balance-Leahy Scale: Fair                              Cognition Arousal/Alertness: Awake/alert Behavior During Therapy: WFL for tasks assessed/performed Overall Cognitive Status: Within Functional Limits for tasks assessed                                        Exercises Total Joint Exercises Ankle Circles/Pumps: AROM;Both;15 reps;Supine Quad Sets: AROM;Both;10 reps;Supine Heel Slides: AAROM;Left;Supine;10 reps Hip ABduction/ADduction: AAROM;Left;Supine;10 reps Long Arc Quad: AROM;Left;10 reps;Seated    General Comments        Pertinent Vitals/Pain Pain Assessment: 0-10 Pain Score: 4  Pain Location: L hip Pain Descriptors / Indicators: Aching;Sore Pain Intervention(s): Limited activity within patient's tolerance;Monitored during session;Premedicated before session;Ice applied    Home Living Family/patient expects to be discharged to:: Private residence Living Arrangements: Other relatives Available Help at Discharge: Family Type of Home: House Home Access: Stairs to enter Entrance Stairs-Rails: Left Home Layout: Two level Home Equipment: Environmental consultant - 2 wheels;Cane - single point;Bedside commode      Prior Function Level of Independence: Independent          PT Goals (current goals can now be found in the care plan section) Acute Rehab PT Goals  Patient Stated Goal: Regain IND PT Goal Formulation: With patient Time For Goal Achievement: 12/27/20 Potential to Achieve Goals: Good Progress towards PT goals: Progressing toward goals    Frequency    7X/week      PT Plan Current plan remains appropriate    Co-evaluation              AM-PAC PT "6 Clicks" Mobility   Outcome Measure  Help needed  turning from your back to your side while in a flat bed without using bedrails?: A Little Help needed moving from lying on your back to sitting on the side of a flat bed without using bedrails?: A Little Help needed moving to and from a bed to a chair (including a wheelchair)?: A Little Help needed standing up from a chair using your arms (e.g., wheelchair or bedside chair)?: A Little Help needed to walk in hospital room?: A Little Help needed climbing 3-5 steps with a railing? : A Little 6 Click Score: 18    End of Session Equipment Utilized During Treatment: Gait belt Activity Tolerance: Patient tolerated treatment well Patient left: with call bell/phone within reach;with family/visitor present;in bed Nurse Communication: Mobility status PT Visit Diagnosis: Unsteadiness on feet (R26.81);Difficulty in walking, not elsewhere classified (R26.2)     Time: 8264-1583 PT Time Calculation (min) (ACUTE ONLY): 35 min  Charges:  $Gait Training: 8-22 mins $Therapeutic Exercise: 8-22 mins                     [place] Pager [phone] Office [phone]    [name] 12/20/2020, 3:29 PM

## 2020-12-20 NOTE — TOC Transition Note (Signed)
Transition of Care [facility]) - CM/SW Discharge Note   Patient Details  Name: [name] MRN: [id number] Date of Birth: 1961-07-29  [facility] CM/SW Contact:  Lia Hopping, [place] Phone Number: 12/20/2020, 10:16 AM   Clinical Narrative:    Prearranged Therapy Plan: HEP Patient confirm she has a RW and elevated toilet seats   Final next level of care: Home/Self Care (HEP) Barriers to Discharge: No Barriers Identified   Patient Goals and CMS Choice        Discharge Placement                       Discharge Plan and Services                                     Social Determinants of Health (SDOH) Interventions     Readmission Risk Interventions No flowsheet data found.

## 2020-12-21 ENCOUNTER — Encounter (HOSPITAL_COMMUNITY): Payer: Self-pay | Admitting: Orthopedic Surgery

## 2020-12-24 NOTE — Discharge Summary (Signed)
Physician Discharge Summary   Patient ID: [name] MRN: [id number] DOB/AGE: 1961/05/27 60 y.o.  Admit date: 12/19/2020 Discharge date: 12/20/2020  Primary Diagnosis: Osteoarthritis of the left hip   Admission Diagnoses:  Past Medical History:  Diagnosis Date  . Coronary artery disease CARDIOLOGIST-- DR Marlou Porch--  WILL REQUEST NOTE   MILD NON-OBSTRUCTIVE CAD  . GERD (gastroesophageal reflux disease)   . H/O hiatal hernia   . History of carpal tunnel syndrome    Bilateral  . History of iron deficiency anemia   . History of MRSA infection   . Hypertension   . Impingement syndrome of left shoulder   . Knee pain    r/knee  . OA (osteoarthritis)   . Pre-diabetes   . PTSD (post-traumatic stress disorder)   . Severe major depression with psychotic features [facility])    Discharge Diagnoses:   Principal Problem:   OA (osteoarthritis) of hip Active Problems:   Osteoarthritis of left hip  Estimated body mass index is 41.05 kg/m as calculated from the following:   Height as of this encounter: 5\' 8"  (1.727 m).   Weight as of this encounter: 122.5 kg.  Procedure:  Procedure(s) (LRB): TOTAL HIP ARTHROPLASTY ANTERIOR APPROACH (Left)   Consults: None  HPI: [name] is a 60 y.o. female who has advanced end-stage arthritis of their Left  hip with progressively worsening pain and dysfunction.The patient has failed nonoperative management and presents for total hip arthroplasty.   Laboratory Data: Admission on 12/19/2020, Discharged on 12/20/2020  Component Date Value Ref Range Status  . WBC 12/20/2020 11.5* 4.0 - 10.5 K/uL Final  . RBC 12/20/2020 4.03  3.87 - 5.11 MIL/uL Final  . Hemoglobin 12/20/2020 11.1* 12.0 - 15.0 g/dL Final  . HCT 12/20/2020 34.9* 36.0 - 46.0 % Final  . MCV 12/20/2020 86.6  80.0 - 100.0 fL Final  . MCH 12/20/2020 27.5  26.0 - 34.0 pg Final  . MCHC 12/20/2020 31.8  30.0 - 36.0 g/dL Final  . RDW 12/20/2020 13.8  11.5 - 15.5 % Final  . Platelets  12/20/2020 242  150 - 400 K/uL Final  . nRBC 12/20/2020 0.0  0.0 - 0.2 % Final   Performed at [facility] [place] [address], [place], [place] [address]  . Sodium 12/20/2020 141  135 - 145 mmol/L Final  . Potassium 12/20/2020 4.4  3.5 - 5.1 mmol/L Final  . Chloride 12/20/2020 107  98 - 111 mmol/L Final  . CO2 12/20/2020 26  22 - 32 mmol/L Final  . Glucose, Bld 12/20/2020 181* 70 - 99 mg/dL Final   Glucose reference range applies only to samples taken after fasting for at least 8 hours.  . BUN 12/20/2020 14  6 - 20 mg/dL Final  . Creatinine, Ser 12/20/2020 0.92  0.44 - 1.00 mg/dL Final  . Calcium 12/20/2020 8.7* 8.9 - 10.3 mg/dL Final  . GFR, Estimated 12/20/2020 >60  >60 mL/min Final   Comment: (NOTE) Calculated using the CKD-EPI Creatinine Equation (2021)   . Anion gap 12/20/2020 8  5 - 15 Final   Performed at [facility], [place] [address] [place], [place] [address]  Hospital Outpatient Visit on 12/15/2020  Component Date Value Ref Range Status  . SARS Coronavirus 2 12/15/2020 NEGATIVE  NEGATIVE Final   Comment: (NOTE) SARS-CoV-2 target nucleic acids are NOT DETECTED.  The SARS-CoV-2 RNA is generally detectable in upper and lower respiratory specimens during the acute phase of infection. Negative results do  not preclude SARS-CoV-2 infection, do not rule out co-infections with other pathogens, and should not be used as the sole basis for treatment or other patient management decisions. Negative results must be combined with clinical observations, patient history, and epidemiological information. The expected result is Negative.  Fact Sheet for Patients: SugarRoll.be  Fact Sheet for Healthcare Providers: https://www.woods-mathews.com/  This test is not yet approved or cleared by the [place] FDA and  has been authorized for detection and/or diagnosis of SARS-CoV-2 by FDA under an Emergency Use  Authorization (EUA). This EUA will remain  in effect (meaning this test can be used) for the duration of the COVID-19 declaration under Se                          ction 564(b)(1) of the Act, 21 U.S.C. section 360bbb-3(b)(1), unless the authorization is terminated or revoked sooner.  Performed at [facility], [place] [address], [place], [place] [address]   Hospital Outpatient Visit on 12/10/2020  Component Date Value Ref Range Status  . Hgb A1c MFr Bld 12/10/2020 6.8* 4.8 - 5.6 % Final   Comment: (NOTE)         Prediabetes: 5.7 - 6.4         Diabetes: >6.4         Glycemic control for adults with diabetes: <7.0   . Mean Plasma Glucose 12/10/2020 148  mg/dL Final   Comment: (NOTE) Performed At: [facility] [place], [place] [id number] Rush Farmer MD IR:[id number]   . MRSA, PCR 12/10/2020 NEGATIVE  NEGATIVE Final  . Staphylococcus aureus 12/10/2020 NEGATIVE  NEGATIVE Final   Comment: (NOTE) The Xpert SA Assay (FDA approved for NASAL specimens in patients 9 years of age and older), is one component of a comprehensive surveillance program. It is not intended to diagnose infection nor to guide or monitor treatment. Performed at [facility], [place] [address], [place], [place] [address]   . WBC 12/10/2020 5.3  4.0 - 10.5 K/uL Final  . RBC 12/10/2020 4.51  3.87 - 5.11 MIL/uL Final  . Hemoglobin 12/10/2020 12.3  12.0 - 15.0 g/dL Final  . HCT 12/10/2020 39.2  36.0 - 46.0 % Final  . MCV 12/10/2020 86.9  80.0 - 100.0 fL Final  . MCH 12/10/2020 27.3  26.0 - 34.0 pg Final  . MCHC 12/10/2020 31.4  30.0 - 36.0 g/dL Final  . RDW 12/10/2020 14.3  11.5 - 15.5 % Final  . Platelets 12/10/2020 289  150 - 400 K/uL Final  . nRBC 12/10/2020 0.0  0.0 - 0.2 % Final   Performed at [facility], [place] [address] [place], [place] [address]  . Sodium 12/10/2020 143  135 - 145 mmol/L Final  . Potassium 12/10/2020 3.9  3.5 - 5.1 mmol/L  Final  . Chloride 12/10/2020 110  98 - 111 mmol/L Final  . CO2 12/10/2020 26  22 - 32 mmol/L Final  . Glucose, Bld 12/10/2020 127* 70 - 99 mg/dL Final   Glucose reference range applies only to samples taken after fasting for at least 8 hours.  . BUN 12/10/2020 24* 6 - 20 mg/dL Final  . Creatinine, Ser 12/10/2020 1.02* 0.44 - 1.00 mg/dL Final  . Calcium 12/10/2020 9.0  8.9 - 10.3 mg/dL Final  . Total Protein 12/10/2020 7.0  6.5 - 8.1 g/dL Final  . Albumin 12/10/2020 4.2  3.5 - 5.0 g/dL Final  . AST  12/10/2020 26  15 - 41 U/L Final  . ALT 12/10/2020 37  0 - 44 U/L Final  . Alkaline Phosphatase 12/10/2020 88  38 - 126 U/L Final  . Total Bilirubin 12/10/2020 0.7  0.3 - 1.2 mg/dL Final  . GFR, Estimated 12/10/2020 >60  >60 mL/min Final   Comment: (NOTE) Calculated using the CKD-EPI Creatinine Equation (2021)   . Anion gap 12/10/2020 7  5 - 15 Final   Performed at [facility], [place] [address] [place], [place] [address]  . Prothrombin Time 12/10/2020 13.2  11.4 - 15.2 seconds Final  . INR 12/10/2020 1.0  0.8 - 1.2 Final   Comment: (NOTE) INR goal varies based on device and disease states. Performed at [facility], [place] [address], [place], [place] [address]   . aPTT 12/10/2020 31  24 - 36 seconds Final   Performed at [facility], [place] [address], [place], [place] [address]  . ABO/RH(D) 12/10/2020 A POS   Final  . Antibody Screen 12/10/2020 NEG   Final  . Sample Expiration 12/10/2020 12/22/2020,2359   Final  . Extend sample reason 12/10/2020    Final                   Value:NO TRANSFUSIONS OR PREGNANCY IN THE PAST 3 MONTHS Performed at [place] [address], [place],  [address]   Lab on 11/01/2020  Component Date Value Ref Range Status  . SARS-CoV-2, NAA 11/01/2020 Not Detected  Not Detected Final   Comment: This nucleic acid amplification test was developed and its performance characteristics  determined by [employer]. Nucleic acid amplification tests include RT-PCR and TMA. This test has not been FDA cleared or approved. This test has been authorized by FDA under an Emergency Use Authorization (EUA). This test is only authorized for the duration of time the declaration that circumstances exist justifying the authorization of the emergency use of in vitro diagnostic tests for detection of SARS-CoV-2 virus and/or diagnosis of COVID-19 infection under section 564(b)(1) of the Act, 21 U.S.C. 295JOA-4(Z) (1), unless the authorization is terminated or revoked sooner. When diagnostic testing is negative, the possibility of a false negative result should be considered in the context of a patient's recent exposures and the presence of clinical signs and symptoms consistent with COVID-19. An individual without symptoms of COVID-19 and who is not shedding SARS-CoV-2 virus wo                          uld expect to have a negative (not detected) result in this assay.   Marland Kitchen SARS-CoV-2, NAA 2 DAY TAT 11/01/2020 Performed   Final     X-Rays:DG Pelvis Portable  Result Date: 12/19/2020 CLINICAL DATA:  Left hip pain. EXAM: PORTABLE PELVIS 1-2 VIEWS COMPARISON:  12/19/2020, earlier today. FINDINGS: Status post left hip arthroplasty. Hardware components are in anatomic alignment. No periprosthetic fracture or dislocation. Moderate degenerative changes are noted involving the right hip. IMPRESSION: Status post left hip arthroplasty. Electronically Signed   By: Kerby Moors M.D.   On: 12/19/2020 16:31   DG C-Arm 1-60 Min-No Report  Result Date: 12/19/2020 Fluoroscopy was utilized by the requesting physician.  No radiographic interpretation.   DG HIP OPERATIVE UNILAT W OR W/O PELVIS LEFT  Result Date: 12/19/2020 CLINICAL DATA:  Status post total hip replacement on the left EXAM: OPERATIVE LEFT HIP  1 VIEW TECHNIQUE: Fluoroscopic spot image(s)  were submitted for interpretation  post-operatively. FLUOROSCOPY TIME:  0 minutes 9 seconds; 4 acquired images COMPARISON:  None. FINDINGS: Images show total hip replacement on the left with prosthetic components well-seated on frontal view. No fracture or dislocation. Mild narrowing right hip joint noted. IMPRESSION: Status post total hip replacement on the left with prosthetic components well-seated on frontal view. Electronically Signed   By: Lowella Grip III M.D.   On: 12/19/2020 17:00    EKG: Orders placed or performed during the hospital encounter of 12/10/20  . EKG 12 lead per protocol  . EKG 12 lead per protocol     Hospital Course: [name] is a 60 y.o. who was admitted to [facility]. They were brought to the operating room on 12/19/2020 and underwent Procedure(s): [place].  Patient tolerated the procedure well and was later transferred to the recovery room and then to the orthopaedic floor for postoperative care. They were given PO and IV analgesics for pain control following their surgery. They were given 24 hours of postoperative antibiotics of  Anti-infectives (From admission, onward)   Start     Dose/Rate Route Frequency Ordered Stop   12/19/20 2000  ceFAZolin (ANCEF) IVPB 2g/100 mL premix        2 g 200 mL/hr over 30 Minutes Intravenous Every 6 hours 12/19/20 1720 12/20/20 0152   12/19/20 0600  ceFAZolin (ANCEF) 3 g in dextrose 5 % 50 mL IVPB        3 g 100 mL/hr over 30 Minutes Intravenous On call to O.R. 12/18/20 8588 12/19/20 1415     and started on DVT prophylaxis in the form of Aspirin.   PT and OT were ordered for total joint protocol. Discharge planning consulted to help with postop disposition and equipment needs.  Patient had a good night on the evening of surgery. They started to get up OOB with therapy on POD #1. Pt was seen during rounds and was ready to go home pending progress with therapy. She worked with therapy on POD #1 and was meeting her goals.  Pt was discharged to home later that day in stable condition.  Diet: Regular diet Activity: WBAT Follow-up: in 2 weeks Disposition: Home with HEP Discharged Condition: stable   Discharge Instructions    Call MD / Call 911   Complete by: As directed    If you experience chest pain or shortness of breath, CALL 911 and be transported to the hospital emergency room.  If you develope a fever above 101 F, pus (white drainage) or increased drainage or redness at the wound, or calf pain, call your surgeon's office.   Change dressing   Complete by: As directed    You have an adhesive waterproof bandage over the incision. Leave this in place until your first follow-up appointment. Once you remove this you will not need to place another bandage.   Constipation Prevention   Complete by: As directed    Drink plenty of fluids.  Prune juice may be helpful.  You may use a stool softener, such as Colace (over the counter) 100 mg twice a day.  Use MiraLax (over the counter) for constipation as needed.   Diet - low sodium heart healthy   Complete by: As directed    Do not sit on low chairs, stoools or toilet seats, as it may be difficult to get up from low surfaces   Complete by: As directed    Driving restrictions  Complete by: As directed    No driving for two weeks   TED hose   Complete by: As directed    Use stockings (TED hose) for three weeks on both leg(s).  You may remove them at night for sleeping.   Weight bearing as tolerated   Complete by: As directed      Allergies as of 12/20/2020      Reactions   Adhesive [tape] Other (See Comments)   Irritation and tears skin      Medication List    STOP taking these medications   oxyCODONE 5 MG immediate release tablet Commonly known as: Oxy IR/ROXICODONE     TAKE these medications   amLODipine 10 MG tablet Commonly known as: NORVASC Take 5 mg by mouth daily.   aspirin 325 MG EC tablet Take 1 tablet (325 mg total) by mouth 2 (two)  times daily for 20 days. Then resume one 81 mg aspirin once a day. What changed:   medication strength  how much to take  when to take this  additional instructions   atorvastatin 80 MG tablet Commonly known as: LIPITOR Take 80 mg by mouth at bedtime.   buPROPion 150 MG 24 hr tablet Commonly known as: WELLBUTRIN XL Take 450 mg by mouth daily.   cholecalciferol 25 MCG (1000 UNIT) tablet Commonly known as: VITAMIN D Take 1,000 Units by mouth daily.   cyclobenzaprine 10 MG tablet Commonly known as: FLEXERIL Take 10 mg by mouth 2 (two) times daily as needed. Muscle spasms   gabapentin 300 MG capsule Commonly known as: NEURONTIN Take 1 capsule (300 mg total) by mouth 2 (two) times daily.   HYDROcodone-acetaminophen 5-325 MG tablet Commonly known as: NORCO/VICODIN Take 1-2 tablets by mouth every 6 (six) hours as needed for moderate pain or severe pain.   hydrOXYzine 25 MG tablet Commonly known as: ATARAX/VISTARIL Take 25 mg by mouth 2 (two) times daily as needed for anxiety.   methocarbamol 500 MG tablet Commonly known as: ROBAXIN Take 1 tablet (500 mg total) by mouth every 6 (six) hours as needed for muscle spasms.   pantoprazole 40 MG tablet Commonly known as: PROTONIX Take 40 mg by mouth daily.   prazosin 1 MG capsule Commonly known as: MINIPRESS Take 1 mg by mouth at bedtime.   Qsymia 15-92 MG Cp24 Generic drug: Phentermine-Topiramate Take 1 tablet by mouth daily.   QUEtiapine 200 MG tablet Commonly known as: SEROQUEL Take 200 mg by mouth at bedtime.   QUEtiapine 50 MG tablet Commonly known as: SEROQUEL Take 50 mg by mouth at bedtime.   sertraline 50 MG tablet Commonly known as: ZOLOFT Take 150 mg by mouth daily.   sucralfate 1 g tablet Commonly known as: CARAFATE Take 1 g by mouth 4 (four) times daily as needed (acid reflux).            Discharge Care Instructions  (From admission, onward)         Start     Ordered   12/20/20 0000   Weight bearing as tolerated        12/20/20 0811   12/20/20 0000  Change dressing       Comments: You have an adhesive waterproof bandage over the incision. Leave this in place until your first follow-up appointment. Once you remove this you will not need to place another bandage.   12/20/20 0240          Follow-up Information    Gaynelle Arabian, MD Follow  up in 2 week(s).   Specialty: Orthopedic Surgery Contact information: [address] [place] [address] [phone]               Signed: Theresa Duty, PA-C Orthopedic Surgery 12/24/2020, 7:20 AM

## 2022-01-01 ENCOUNTER — Ambulatory Visit: Payer: Self-pay | Admitting: Orthopedic Surgery

## 2022-01-01 DIAGNOSIS — Z01818 Encounter for other preprocedural examination: Secondary | ICD-10-CM

## 2022-01-24 NOTE — Pre-Procedure Instructions (Signed)
Surgical Instructions ? ? ? Your procedure is scheduled on Thursday 01/30/22. ? ? Report to [facility] Main Entrance "A" at 05:30 A.M., then check in with the Admitting office. ? Call this number if you have problems the morning of surgery: ? [phone] ? ? If you have any questions prior to your surgery date call [phone]: Open Monday-Friday 8am-4pm ? ? ? Remember: ? Do not eat after midnight the night before your surgery ? ?You may drink clear liquids until 04:30 A.M. the morning of your surgery.   ?Clear liquids allowed are: Water, Non-Citrus Juices (without pulp), Carbonated Beverages, Clear Tea, Black Coffee ONLY (NO MILK, CREAM OR POWDERED CREAMER of any kind), and Gatorade ?  ? Take these medicines the morning of surgery with A SIP OF WATER:  ? amLODipine (NORVASC) ? buPROPion (WELLBUTRIN XL) ? gabapentin (NEURONTIN)  ? pantoprazole (PROTONIX) ? QUEtiapine (SEROQUEL) ? sertraline (ZOLOFT)  ? ? ? Take these medicines if needed:  ? cyclobenzaprine (FLEXERIL) ? HYDROcodone-acetaminophen (NORCO/VICODIN)  ? hydrOXYzine (ATARAX/VISTARIL) ? methocarbamol (ROBAXIN) ? ?Stop taking Phentermine-Topiramate [facility]) two week prior to surgery or as soon as possible.  ? ? ?As of today, STOP taking any Aspirin (unless otherwise instructed by your surgeon) Aleve, Naproxen, Ibuprofen, Motrin, Advil, Goody's, BC's, all herbal medications, fish oil, and all vitamins. ? ?         ?Do not wear jewelry or makeup ?Do not wear lotions, powders, perfumes/colognes, or deodorant. ?Do not shave 48 hours prior to surgery.  Men may shave face and neck. ?Do not bring valuables to the hospital. ?Do not wear nail polish, gel polish, artificial nails, or any other type of covering on natural nails (fingers and toes) ?If you have artificial nails or gel coating that need to be removed by a nail salon, please have this removed prior to surgery. Artificial nails or gel coating may interfere with anesthesia's ability to adequately monitor your  vital signs. ? ?[facility] is not responsible for any belongings or valuables. .  ? ?Do NOT Smoke (Tobacco/Vaping)  24 hours prior to your procedure ? ?If you use a CPAP at night, you may bring your mask for your overnight stay. ?  ?Contacts, glasses, hearing aids, dentures or partials may not be worn into surgery, please bring cases for these belongings ?  ?For patients admitted to the hospital, discharge time will be determined by your treatment team. ?  ?Patients discharged the day of surgery will not be allowed to drive home, and someone needs to stay with them for 24 hours. ? ? ?SURGICAL WAITING ROOM VISITATION ?Patients having surgery or a procedure in a hospital may have two support people. ?Children under the age of 31 must have an adult with them who is not the patient. ?They may stay in the waiting area during the procedure and may switch out with other visitors. If the patient needs to stay at the hospital during part of their recovery, the visitor guidelines for inpatient rooms apply. ? ?Please refer to the [facility] website for the visitor guidelines for Inpatients (after your surgery is over and you are in a regular room).  ? ? ? ? ? ?Special instructions:   ? ?Oral Hygiene is also important to reduce your risk of infection.  Remember - BRUSH YOUR TEETH THE MORNING OF SURGERY WITH YOUR REGULAR TOOTHPASTE ? ? ?[facility]- Preparing For Surgery ? ?Before surgery, you can play an important role. Because skin is not sterile, your skin needs to be  as free of germs as possible. You can reduce the number of germs on your skin by washing with CHG (chlorahexidine gluconate) Soap before surgery.  CHG is an antiseptic cleaner which kills germs and bonds with the skin to continue killing germs even after washing.   ? ? ?Please do not use if you have an allergy to CHG or antibacterial soaps. If your skin becomes reddened/irritated stop using the CHG.  ?Do not shave (including legs and underarms) for at least 48  hours prior to first CHG shower. It is OK to shave your face. ? ?Please follow these instructions carefully. ?  ? ? Shower the NIGHT BEFORE SURGERY and the MORNING OF SURGERY with CHG Soap.  ? If you chose to wash your hair, wash your hair first as usual with your normal shampoo. After you shampoo, rinse your hair and body thoroughly to remove the shampoo.  Then [employer] and genitals (private parts) with your normal soap and rinse thoroughly to remove soap. ? ?After that Use CHG Soap as you would any other liquid soap. You can apply CHG directly to the skin and wash gently with a scrungie or a clean washcloth.  ? ?Apply the CHG Soap to your body ONLY FROM THE NECK DOWN.  Do not use on open wounds or open sores. Avoid contact with your eyes, ears, mouth and genitals (private parts). Wash Face and genitals (private parts)  with your normal soap.  ? ?Wash thoroughly, paying special attention to the area where your surgery will be performed. ? ?Thoroughly rinse your body with warm water from the neck down. ? ?DO NOT shower/wash with your normal soap after using and rinsing off the CHG Soap. ? ?Pat yourself dry with a CLEAN TOWEL. ? ?Wear CLEAN PAJAMAS to bed the night before surgery ? ?Place CLEAN SHEETS on your bed the night before your surgery ? ?DO NOT SLEEP WITH PETS. ? ? ?Day of Surgery: ? ?Take a shower with CHG soap. ?Wear Clean/Comfortable clothing the morning of surgery ?Do not apply any deodorants/lotions.   ?Remember to brush your teeth WITH YOUR REGULAR TOOTHPASTE. ? ? ? ?If you received a COVID test during your pre-op visit  it is requested that you wear a mask when out in public, stay away from anyone that may not be feeling well and notify your surgeon if you develop symptoms. If you have been in contact with anyone that has tested positive in the last 10 days please notify you surgeon. ? ?  ?Please read over the following fact sheets that you were given.  ? ?

## 2022-01-27 ENCOUNTER — Encounter (HOSPITAL_COMMUNITY): Payer: Self-pay

## 2022-01-27 ENCOUNTER — Other Ambulatory Visit: Payer: Self-pay

## 2022-01-27 ENCOUNTER — Ambulatory Visit (HOSPITAL_COMMUNITY)
Admission: RE | Admit: 2022-01-27 | Discharge: 2022-01-27 | Disposition: A | Discharge status: Home / Self Care | Payer: No Typology Code available for payment source | Source: Ambulatory Visit | Attending: Orthopedic Surgery | Admitting: Orthopedic Surgery

## 2022-01-27 DIAGNOSIS — Z01818 Encounter for other preprocedural examination: Secondary | ICD-10-CM | POA: Insufficient documentation

## 2022-01-27 HISTORY — DX: Angina pectoris, unspecified: I20.9

## 2022-01-27 LAB — URINALYSIS, ROUTINE W REFLEX MICROSCOPIC
Bilirubin Urine: NEGATIVE
Glucose, UA: NEGATIVE mg/dL
Hgb urine dipstick: NEGATIVE
Ketones, ur: NEGATIVE mg/dL
Leukocytes,Ua: NEGATIVE
Nitrite: NEGATIVE
Protein, ur: NEGATIVE mg/dL
Specific Gravity, Urine: 1.024 (ref 1.005–1.030)
pH: 5 (ref 5.0–8.0)

## 2022-01-27 LAB — BASIC METABOLIC PANEL
Anion gap: 9 (ref 5–15)
BUN: 10 mg/dL (ref 6–20)
CO2: 23 mmol/L (ref 22–32)
Calcium: 8.6 mg/dL — ABNORMAL LOW (ref 8.9–10.3)
Chloride: 111 mmol/L (ref 98–111)
Creatinine, Ser: 1.04 mg/dL — ABNORMAL HIGH (ref 0.44–1.00)
GFR, Estimated: >60 mL/min (ref >60)
Glucose, Bld: 110 mg/dL — ABNORMAL HIGH (ref 70–99)
Potassium: 3.7 mmol/L (ref 3.5–5.1)
Sodium: 143 mmol/L (ref 135–145)

## 2022-01-27 LAB — CBC
HCT: 40.1 % (ref 36.0–46.0)
Hemoglobin: 12.9 g/dL (ref 12.0–15.0)
MCH: 27.5 pg (ref 26.0–34.0)
MCHC: 32.2 g/dL (ref 30.0–36.0)
MCV: 85.5 fL (ref 80.0–100.0)
Platelets: 318 10*3/uL (ref 150–400)
RBC: 4.69 MIL/uL (ref 3.87–5.11)
RDW: 13.5 % (ref 11.5–15.5)
WBC: 6.6 10*3/uL (ref 4.0–10.5)
nRBC: 0 % (ref 0.0–0.2)

## 2022-01-27 LAB — SURGICAL PCR SCREEN
MRSA, PCR: NEGATIVE
Staphylococcus aureus: NEGATIVE

## 2022-01-27 LAB — GLUCOSE, CAPILLARY: Glucose-Capillary: 150 mg/dL — ABNORMAL HIGH (ref 70–99)

## 2022-01-27 NOTE — Progress Notes (Signed)
PCP - Jetta Lout, MD ?Cardiologist - Candee Furbish, MD ? ?PPM/ICD - denies ?Device Orders - n/a ?Rep Notified - n/a ? ?Chest x-ray - n/a ?EKG - 01/27/2022 ?Stress Test - 2009 - abnormal per patient ?ECHO - denies ?Cardiac Cath - 01/20/2008 ? ?Sleep Study - denies ?CPAP - n/a ? ?Fasting Blood Sugar - n/a ? ?Blood Thinner Instructions: n/a ? ?Aspirin Instructions: Aspirin - last dose on 01/22/2022 per patient ? ?Patient was instructed:  As of today, STOP taking any Aspirin (unless otherwise instructed by your surgeon) Aleve, Naproxen, Ibuprofen, Motrin, Advil, Goody's, BC's, all herbal medications, fish oil, and all vitamins. ? ?Patient was instructed: Stop taking Phentermine-Topiramate [facility]) two week prior to surgery or as soon as possible.  ? ?ERAS Protcol - yes, until 04:30 o'clock ? ?COVID TEST- n/a ? ?Anesthesia review: yes - per MD request; cardiac history ? ?Patient denies shortness of breath, fever, cough and chest pain at PAT appointment ? ? ?All instructions explained to the patient, with a verbal understanding of the material. Patient agrees to go over the instructions while at home for a better understanding. Patient also instructed to self quarantine after being tested for COVID-19. The opportunity to ask questions was provided. ?  ?

## 2022-01-28 ENCOUNTER — Encounter (HOSPITAL_COMMUNITY): Payer: Self-pay

## 2022-01-29 NOTE — Anesthesia Preprocedure Evaluation (Addendum)
Anesthesia Evaluation  ?Patient identified by MRN, date of birth, ID band ?Patient awake ? ? ? ?Reviewed: ?Allergy & Precautions, NPO status , Patient's Chart, lab work & pertinent test results ? ?Airway ?Mallampati: II ? ?TM Distance: >3 FB ? ? ? ? Dental ?  ?Pulmonary ? ?  ?breath sounds clear to auscultation ? ? ? ? ? ? Cardiovascular ?hypertension, + CAD  ? ?Rhythm:Regular Rate:Normal ? ? ?  ?Neuro/Psych ?PSYCHIATRIC DISORDERS   ? GI/Hepatic ?Neg liver ROS, hiatal hernia, GERD  ,  ?Endo/Other  ?negative endocrine ROS ? Renal/GU ?negative Renal ROS  ? ?  ?Musculoskeletal ? ? Abdominal ?  ?Peds ? Hematology ?  ?Anesthesia Other Findings ? ? Reproductive/Obstetrics ? ?  ? ? ? ? ? ? ? ? ? ? ? ? ? ?  ?  ? ? ? ? ? ? ? ?Anesthesia Physical ?Anesthesia Plan ? ?ASA: 3 ? ?Anesthesia Plan: General  ? ?Post-op Pain Management:   ? ?Induction:  ? ?PONV Risk Score and Plan: Treatment may vary due to age or medical condition, Ondansetron, Dexamethasone and Midazolam ? ?Airway Management Planned:  ? ?Additional Equipment:  ? ?Intra-op Plan:  ? ?Post-operative Plan:  ? ?Informed Consent: I have reviewed the patients History and Physical, chart, labs and discussed the procedure including the risks, benefits and alternatives for the proposed anesthesia with the patient or authorized representative who has indicated his/her understanding and acceptance.  ? ? ? ? ? ?Plan Discussed with: Anesthesiologist and CRNA ? ?Anesthesia Plan Comments:   ? ? ? ? ? ?Anesthesia Quick Evaluation ? ?

## 2022-01-29 NOTE — H&P (Signed)
? ? ?Chief Complaint: ?Chronic pain ? ?History: ?Joslyne presents today following a successful spinal cord stimulator trial. Patient states their pain level is moderate to severe. Patient states her pain does interfere with activities of daily living as well as overall quality of life.  Plan on moving forward with SCS placement ? ?Past Medical History:  ?Diagnosis Date  ? Coronary artery disease   ? Mild by cath 2009  ? GERD (gastroesophageal reflux disease)   ? H/O hiatal hernia   ? History of carpal tunnel syndrome   ? Bilateral  ? History of iron deficiency anemia   ? History of MRSA infection   ? Hypertension   ? Impingement syndrome of left shoulder   ? Knee pain   ? r/knee  ? OA (osteoarthritis)   ? Pre-diabetes   ? PTSD (post-traumatic stress disorder)   ? Severe major depression with psychotic features ([place])   ? ? ?Allergies  ?Allergen Reactions  ? Adhesive [Tape] Other (See Comments)  ?  Irritation and tears skin  ? ? ?No current facility-administered medications on file prior to encounter.  ? ?Current Outpatient Medications on File Prior to Encounter  ?Medication Sig Dispense Refill  ? amLODipine (NORVASC) 10 MG tablet Take 5 mg by mouth daily.    ? aspirin EC 81 MG tablet Take 81 mg by mouth daily. Swallow whole.    ? atorvastatin (LIPITOR) 80 MG tablet Take 80 mg by mouth at bedtime.   1  ? buPROPion (WELLBUTRIN XL) 150 MG 24 hr tablet Take 450 mg by mouth in the morning, at noon, and at bedtime.    ? cyclobenzaprine (FLEXERIL) 10 MG tablet Take 10 mg by mouth 2 (two) times daily as needed for muscle spasms.  1  ? gabapentin (NEURONTIN) 300 MG capsule Take 1 capsule (300 mg total) by mouth 2 (two) times daily.  0  ? HYDROcodone-acetaminophen (NORCO/VICODIN) 5-325 MG tablet Take 1-2 tablets by mouth every 6 (six) hours as needed for moderate pain or severe pain. 42 tablet 0  ? hydrOXYzine (ATARAX/VISTARIL) 25 MG tablet Take 25 mg by mouth 2 (two) times daily as needed for anxiety.    ? methocarbamol  (ROBAXIN) 500 MG tablet Take 1 tablet (500 mg total) by mouth every 6 (six) hours as needed for muscle spasms. 40 tablet 0  ? pantoprazole (PROTONIX) 40 MG tablet Take 40 mg by mouth daily.    ? Phentermine-Topiramate (QSYMIA) 15-92 MG CP24 Take 1 tablet by mouth daily.    ? prazosin (MINIPRESS) 1 MG capsule Take 1 mg by mouth at bedtime.    ? QUEtiapine (SEROQUEL) 200 MG tablet Take 200 mg by mouth at bedtime.    ? QUEtiapine (SEROQUEL) 50 MG tablet Take 50 mg by mouth daily.    ? sertraline (ZOLOFT) 50 MG tablet Take 150 mg by mouth daily.    ? sucralfate (CARAFATE) 1 g tablet Take 1 g by mouth 4 (four) times daily as needed (acid reflux).     ? ? ?Physical Exam: ?General: AAOX3, well developed and well nourished, NAD ?Ambulation: antalgic gait pattern Due to left hip and low back pain, uses cane assistive device. ?Inspection: No obvious deformity ?Palpation: Non-tender over spinous processes and paraspinal muscles. Tender over left SI joint ?AROM: ?- Moderate pain is observed with range of motion of the lumbar spine such as going from sitting to standing ?-Hip IR/ER: LeftElicits pain, right Pain free ?- Knee: flexion and extension normal and pain free bilaterally. ?-  Ankle: Dorsiflexion, plantarflexion, inversion, eversion normal and pain free. ?Dermatomes: Lower extremity sensation to light touch intact bilaterally. Patient describes intermittent episodes of left thigh pain and dysesthesias ?Myotomes: ?- 5/5 lower extremity motor strength bilaterally ?Reflexes: ?- Clonus: Negative ?Special Tests: ?- Straight Leg Raise: Left Negative, Right Negative ?PV: Extremities warm and well profused. Posterior and dorsalis pedis pulse 2+ bilaterally, No pitting Edema, discoloration, calf tenderness ? ?Image: ?Thoracic MRI completed on 12/28/2020 was reviewed. No cord signal changes. There is a small central left disc protrusion at T10-11. Mild degenerative disease. No significant stenosis that would prohibit implantation of a  spinal cord stimulator. ? ? ?A/P: ?Summary: Camaya presents today following a successful spinal cord stimulator trial. Patient states their pain level is moderate to severe. Patient states her pain does interfere with activities of daily living as well as overall quality of life.  ?  ?Diagnosis: Jaydn is a very pleasant 61 year old who recently had a successful spinal cord stimulator trial and presents today for definitive implantation. We have gone over the surgical procedure which would be implantation of a spinal cord stimulator. All the risks, benefits, and alternatives were discussed with the patient and her sister ? ?Risks and benefits of surgery were discussed with the patient. These include: Infection, bleeding, death, stroke, paralysis, ongoing or worse pain, need for additional surgery, leak of spinal fluid, Failure of the battery requiring reoperation. Inability to place the paddle requiring the surgery to be aborted. Migration of the lead, failure to obtain results similar to the trial. ? ?

## 2022-01-30 ENCOUNTER — Encounter (HOSPITAL_COMMUNITY): Payer: Self-pay | Admitting: Orthopedic Surgery

## 2022-01-30 ENCOUNTER — Ambulatory Visit (HOSPITAL_BASED_OUTPATIENT_CLINIC_OR_DEPARTMENT_OTHER): Payer: No Typology Code available for payment source | Admitting: Anesthesiology

## 2022-01-30 ENCOUNTER — Observation Stay (HOSPITAL_COMMUNITY)
Admission: RE | Admit: 2022-01-30 | Discharge: 2022-01-31 | Disposition: A | Discharge status: Home / Self Care | Payer: No Typology Code available for payment source | Source: Ambulatory Visit | Attending: Orthopedic Surgery | Admitting: Orthopedic Surgery

## 2022-01-30 ENCOUNTER — Ambulatory Visit (HOSPITAL_COMMUNITY): Payer: No Typology Code available for payment source | Admitting: Physician Assistant

## 2022-01-30 ENCOUNTER — Encounter (HOSPITAL_COMMUNITY)
Admission: RE | Disposition: A | Discharge status: Home / Self Care | Payer: Self-pay | Source: Ambulatory Visit | Attending: Orthopedic Surgery

## 2022-01-30 ENCOUNTER — Other Ambulatory Visit: Payer: Self-pay

## 2022-01-30 ENCOUNTER — Ambulatory Visit (HOSPITAL_COMMUNITY): Payer: No Typology Code available for payment source

## 2022-01-30 DIAGNOSIS — I10 Essential (primary) hypertension: Secondary | ICD-10-CM | POA: Diagnosis not present

## 2022-01-30 DIAGNOSIS — Z79899 Other long term (current) drug therapy: Secondary | ICD-10-CM | POA: Diagnosis not present

## 2022-01-30 DIAGNOSIS — G8929 Other chronic pain: Secondary | ICD-10-CM | POA: Diagnosis present

## 2022-01-30 DIAGNOSIS — M545 Low back pain, unspecified: Secondary | ICD-10-CM

## 2022-01-30 DIAGNOSIS — Z7982 Long term (current) use of aspirin: Secondary | ICD-10-CM | POA: Diagnosis not present

## 2022-01-30 DIAGNOSIS — I2510 Atherosclerotic heart disease of native coronary artery without angina pectoris: Secondary | ICD-10-CM | POA: Insufficient documentation

## 2022-01-30 DIAGNOSIS — M5416 Radiculopathy, lumbar region: Secondary | ICD-10-CM | POA: Diagnosis not present

## 2022-01-30 DIAGNOSIS — R7303 Prediabetes: Secondary | ICD-10-CM | POA: Diagnosis not present

## 2022-01-30 DIAGNOSIS — M79609 Pain in unspecified limb: Secondary | ICD-10-CM | POA: Diagnosis not present

## 2022-01-30 HISTORY — PX: SPINAL CORD STIMULATOR INSERTION: SHX5378

## 2022-01-30 LAB — GLUCOSE, CAPILLARY: Glucose-Capillary: 150 mg/dL — ABNORMAL HIGH (ref 70–99)

## 2022-01-30 SURGERY — INSERTION, SPINAL CORD STIMULATOR, LUMBAR
Anesthesia: General | Site: Spine Lumbar

## 2022-01-30 MED ORDER — 0.9 % SODIUM CHLORIDE (POUR BTL) OPTIME
TOPICAL | Status: DC | PRN
  Administered 2022-01-30: 1000 mL

## 2022-01-30 MED ORDER — FLEET ENEMA 7-19 GM/118ML RE ENEM: 1.0000 | ENEMA | Freq: Once | RECTAL | Status: DC | PRN

## 2022-01-30 MED ORDER — QUETIAPINE FUMARATE 50 MG PO TABS
250.0000 mg | ORAL_TABLET | Freq: Every day | ORAL | Status: DC
  Filled 2022-01-30 (×2): qty 1

## 2022-01-30 MED ORDER — PRAZOSIN HCL 1 MG PO CAPS
1.0000 mg | ORAL_CAPSULE | Freq: Every day | ORAL | Status: DC
  Filled 2022-01-30 (×2): qty 1

## 2022-01-30 MED ORDER — LACTATED RINGERS IV SOLN: INTRAVENOUS | Status: DC

## 2022-01-30 MED ORDER — SUCCINYLCHOLINE CHLORIDE 200 MG/10ML IV SOSY
PREFILLED_SYRINGE | INTRAVENOUS | Status: DC | PRN
  Administered 2022-01-30: 140 mg via INTRAVENOUS

## 2022-01-30 MED ORDER — FENTANYL CITRATE (PF) 100 MCG/2ML IJ SOLN
INTRAMUSCULAR | Status: AC
  Filled 2022-01-30: qty 2

## 2022-01-30 MED ORDER — OXYCODONE HCL 5 MG PO TABS: 5.0000 mg | ORAL_TABLET | ORAL | Status: DC | PRN

## 2022-01-30 MED ORDER — SODIUM CHLORIDE 0.9% FLUSH
3.0000 mL | Freq: Two times a day (BID) | INTRAVENOUS | Status: DC
  Administered 2022-01-30: 3 mL via INTRAVENOUS

## 2022-01-30 MED ORDER — ONDANSETRON HCL 4 MG/2ML IJ SOLN: 4.0000 mg | Freq: Four times a day (QID) | INTRAMUSCULAR | Status: DC | PRN

## 2022-01-30 MED ORDER — SUCCINYLCHOLINE CHLORIDE 200 MG/10ML IV SOSY
PREFILLED_SYRINGE | INTRAVENOUS | Status: AC
  Filled 2022-01-30: qty 10

## 2022-01-30 MED ORDER — LIDOCAINE 2% (20 MG/ML) 5 ML SYRINGE
INTRAMUSCULAR | Status: AC
  Filled 2022-01-30: qty 5

## 2022-01-30 MED ORDER — MENTHOL 3 MG MT LOZG: 1.0000 | LOZENGE | OROMUCOSAL | Status: DC | PRN

## 2022-01-30 MED ORDER — SODIUM CHLORIDE 0.9 % IV SOLN: 250.0000 mL | INTRAVENOUS | Status: DC

## 2022-01-30 MED ORDER — POLYETHYLENE GLYCOL 3350 17 G PO PACK: 17.0000 g | PACK | Freq: Every day | ORAL | Status: DC | PRN

## 2022-01-30 MED ORDER — CEFAZOLIN SODIUM-DEXTROSE 1-4 GM/50ML-% IV SOLN
1.0000 g | Freq: Three times a day (TID) | INTRAVENOUS | Status: AC
  Administered 2022-01-30 (×2): 1 g via INTRAVENOUS
  Filled 2022-01-30 (×2): qty 50

## 2022-01-30 MED ORDER — LIDOCAINE 2% (20 MG/ML) 5 ML SYRINGE
INTRAMUSCULAR | Status: DC | PRN
Start: 2022-01-30 — End: 2022-01-30
  Administered 2022-01-30: 70 mg via INTRAVENOUS

## 2022-01-30 MED ORDER — FENTANYL CITRATE (PF) 250 MCG/5ML IJ SOLN
INTRAMUSCULAR | Status: DC | PRN
  Administered 2022-01-30: 100 ug via INTRAVENOUS
  Administered 2022-01-30 (×3): 50 ug via INTRAVENOUS

## 2022-01-30 MED ORDER — ACETAMINOPHEN 325 MG PO TABS
650.0000 mg | ORAL_TABLET | ORAL | Status: DC | PRN
  Administered 2022-01-30 – 2022-01-31 (×2): 650 mg via ORAL
  Filled 2022-01-30 (×2): qty 2

## 2022-01-30 MED ORDER — QUETIAPINE FUMARATE 50 MG PO TABS: 50.0000 mg | ORAL_TABLET | Freq: Every day | ORAL | Status: DC

## 2022-01-30 MED ORDER — SURGIFLO WITH THROMBIN (HEMOSTATIC MATRIX KIT) OPTIME
TOPICAL | Status: DC | PRN
  Administered 2022-01-30: 1 via TOPICAL

## 2022-01-30 MED ORDER — METHOCARBAMOL 500 MG PO TABS
500.0000 mg | ORAL_TABLET | Freq: Three times a day (TID) | ORAL | 0 refills | Status: AC | PRN
Start: 2022-01-30 — End: 2022-02-04

## 2022-01-30 MED ORDER — OXYCODONE HCL 5 MG PO TABS
10.0000 mg | ORAL_TABLET | ORAL | Status: DC | PRN
  Administered 2022-01-30 – 2022-01-31 (×5): 10 mg via ORAL
  Filled 2022-01-30 (×5): qty 2

## 2022-01-30 MED ORDER — ONDANSETRON HCL 4 MG/2ML IJ SOLN
INTRAMUSCULAR | Status: AC
  Filled 2022-01-30: qty 2

## 2022-01-30 MED ORDER — ROCURONIUM BROMIDE 10 MG/ML (PF) SYRINGE
PREFILLED_SYRINGE | INTRAVENOUS | Status: AC
  Filled 2022-01-30: qty 10

## 2022-01-30 MED ORDER — ONDANSETRON HCL 4 MG/2ML IJ SOLN
INTRAMUSCULAR | Status: DC | PRN
  Administered 2022-01-30: 4 mg via INTRAVENOUS

## 2022-01-30 MED ORDER — GABAPENTIN 300 MG PO CAPS
300.0000 mg | ORAL_CAPSULE | Freq: Two times a day (BID) | ORAL | Status: DC
  Administered 2022-01-31: 300 mg via ORAL
  Filled 2022-01-30 (×2): qty 1

## 2022-01-30 MED ORDER — METHOCARBAMOL 1000 MG/10ML IJ SOLN
500.0000 mg | Freq: Four times a day (QID) | INTRAVENOUS | Status: DC | PRN
  Filled 2022-01-30: qty 5

## 2022-01-30 MED ORDER — OXYCODONE-ACETAMINOPHEN 10-325 MG PO TABS: 1.0000 | ORAL_TABLET | Freq: Four times a day (QID) | ORAL | 0 refills | Status: AC | PRN

## 2022-01-30 MED ORDER — GLYCOPYRROLATE PF 0.2 MG/ML IJ SOSY
PREFILLED_SYRINGE | INTRAMUSCULAR | Status: AC
  Filled 2022-01-30: qty 1

## 2022-01-30 MED ORDER — SERTRALINE HCL 50 MG PO TABS: 150.0000 mg | ORAL_TABLET | Freq: Every day | ORAL | Status: DC

## 2022-01-30 MED ORDER — PANTOPRAZOLE SODIUM 40 MG PO TBEC
40.0000 mg | DELAYED_RELEASE_TABLET | Freq: Every day | ORAL | Status: DC
  Administered 2022-01-30 – 2022-01-31 (×2): 40 mg via ORAL
  Filled 2022-01-30 (×2): qty 1

## 2022-01-30 MED ORDER — AMLODIPINE BESYLATE 5 MG PO TABS: 5.0000 mg | ORAL_TABLET | Freq: Every day | ORAL | Status: DC

## 2022-01-30 MED ORDER — DEXAMETHASONE SODIUM PHOSPHATE 10 MG/ML IJ SOLN
INTRAMUSCULAR | Status: DC | PRN
  Administered 2022-01-30: 4 mg via INTRAVENOUS

## 2022-01-30 MED ORDER — SUGAMMADEX SODIUM 200 MG/2ML IV SOLN
INTRAVENOUS | Status: DC | PRN
Start: 2022-01-30 — End: 2022-01-30
  Administered 2022-01-30: 200 mg via INTRAVENOUS

## 2022-01-30 MED ORDER — FENTANYL CITRATE (PF) 250 MCG/5ML IJ SOLN
INTRAMUSCULAR | Status: AC
  Filled 2022-01-30: qty 5

## 2022-01-30 MED ORDER — CEFAZOLIN IN SODIUM CHLORIDE 3-0.9 GM/100ML-% IV SOLN
3.0000 g | INTRAVENOUS | Status: AC
  Administered 2022-01-30: 3 g via INTRAVENOUS
  Filled 2022-01-30: qty 100

## 2022-01-30 MED ORDER — LACTATED RINGERS IV SOLN: INTRAVENOUS | Status: DC | PRN

## 2022-01-30 MED ORDER — GLYCOPYRROLATE PF 0.2 MG/ML IJ SOSY
PREFILLED_SYRINGE | INTRAMUSCULAR | Status: DC | PRN
  Administered 2022-01-30 (×2): .1 mg via INTRAVENOUS

## 2022-01-30 MED ORDER — PHENYLEPHRINE 40 MCG/ML (10ML) SYRINGE FOR IV PUSH (FOR BLOOD PRESSURE SUPPORT)
PREFILLED_SYRINGE | INTRAVENOUS | Status: DC | PRN
  Administered 2022-01-30: 80 ug via INTRAVENOUS
  Administered 2022-01-30: 120 ug via INTRAVENOUS

## 2022-01-30 MED ORDER — CHLORHEXIDINE GLUCONATE 0.12 % MT SOLN
15.0000 mL | Freq: Once | OROMUCOSAL | Status: AC
  Administered 2022-01-30: 15 mL via OROMUCOSAL
  Filled 2022-01-30: qty 15

## 2022-01-30 MED ORDER — BUPIVACAINE-EPINEPHRINE (PF) 0.25% -1:200000 IJ SOLN
INTRAMUSCULAR | Status: AC
  Filled 2022-01-30: qty 30

## 2022-01-30 MED ORDER — QUETIAPINE FUMARATE 200 MG PO TABS: 200.0000 mg | ORAL_TABLET | Freq: Every day | ORAL | Status: DC

## 2022-01-30 MED ORDER — FENTANYL CITRATE (PF) 100 MCG/2ML IJ SOLN
25.0000 ug | INTRAMUSCULAR | Status: DC | PRN
  Administered 2022-01-30 (×2): 25 ug via INTRAVENOUS

## 2022-01-30 MED ORDER — PROPOFOL 10 MG/ML IV BOLUS
INTRAVENOUS | Status: DC | PRN
  Administered 2022-01-30: 30 mg via INTRAVENOUS
  Administered 2022-01-30: 120 mg via INTRAVENOUS

## 2022-01-30 MED ORDER — ATORVASTATIN CALCIUM 80 MG PO TABS
80.0000 mg | ORAL_TABLET | Freq: Every day | ORAL | Status: DC
  Administered 2022-01-30: 80 mg via ORAL
  Filled 2022-01-30: qty 1

## 2022-01-30 MED ORDER — PROPOFOL 10 MG/ML IV BOLUS
INTRAVENOUS | Status: AC
  Filled 2022-01-30: qty 20

## 2022-01-30 MED ORDER — BUPROPION HCL ER (XL) 300 MG PO TB24: 450.0000 mg | ORAL_TABLET | Freq: Every day | ORAL | Status: DC

## 2022-01-30 MED ORDER — ROCURONIUM BROMIDE 10 MG/ML (PF) SYRINGE
PREFILLED_SYRINGE | INTRAVENOUS | Status: DC | PRN
  Administered 2022-01-30: 50 mg via INTRAVENOUS

## 2022-01-30 MED ORDER — ORAL CARE MOUTH RINSE: 15.0000 mL | Freq: Once | OROMUCOSAL | Status: AC

## 2022-01-30 MED ORDER — ONDANSETRON HCL 4 MG PO TABS
4.0000 mg | ORAL_TABLET | Freq: Four times a day (QID) | ORAL | Status: DC | PRN
  Administered 2022-01-31: 4 mg via ORAL
  Filled 2022-01-30: qty 1

## 2022-01-30 MED ORDER — PHENOL 1.4 % MT LIQD: 1.0000 | OROMUCOSAL | Status: DC | PRN

## 2022-01-30 MED ORDER — SODIUM CHLORIDE 0.9% FLUSH: 3.0000 mL | INTRAVENOUS | Status: DC | PRN

## 2022-01-30 MED ORDER — ACETAMINOPHEN 650 MG RE SUPP: 650.0000 mg | RECTAL | Status: DC | PRN

## 2022-01-30 MED ORDER — HYDROXYZINE HCL 25 MG PO TABS: 25.0000 mg | ORAL_TABLET | Freq: Two times a day (BID) | ORAL | Status: DC | PRN

## 2022-01-30 MED ORDER — MIDAZOLAM HCL 2 MG/2ML IJ SOLN
INTRAMUSCULAR | Status: DC | PRN
  Administered 2022-01-30: 1 mg via INTRAVENOUS

## 2022-01-30 MED ORDER — BUPIVACAINE-EPINEPHRINE 0.25% -1:200000 IJ SOLN
INTRAMUSCULAR | Status: DC | PRN
  Administered 2022-01-30: 20 mL

## 2022-01-30 MED ORDER — MIDAZOLAM HCL 2 MG/2ML IJ SOLN
INTRAMUSCULAR | Status: AC
  Filled 2022-01-30: qty 2

## 2022-01-30 MED ORDER — DEXAMETHASONE SODIUM PHOSPHATE 10 MG/ML IJ SOLN
INTRAMUSCULAR | Status: AC
  Filled 2022-01-30: qty 1

## 2022-01-30 MED ORDER — METHOCARBAMOL 500 MG PO TABS
500.0000 mg | ORAL_TABLET | Freq: Four times a day (QID) | ORAL | Status: DC | PRN
  Administered 2022-01-30 – 2022-01-31 (×3): 500 mg via ORAL
  Filled 2022-01-30 (×3): qty 1

## 2022-01-30 MED ORDER — HYDROMORPHONE HCL 1 MG/ML IJ SOLN: 1.0000 mg | INTRAMUSCULAR | Status: DC | PRN

## 2022-01-30 MED ORDER — ONDANSETRON HCL 4 MG PO TABS: 4.0000 mg | ORAL_TABLET | Freq: Three times a day (TID) | ORAL | 0 refills | Status: AC | PRN

## 2022-01-30 SURGICAL SUPPLY — 64 items
ADH SKN CLS APL DERMABOND .7 (GAUZE/BANDAGES/DRESSINGS)
AGENT HMST KT MTR STRL THRMB (HEMOSTASIS) ×1
ANCH LD SWIFT-LCK ×2 IMPLANT
ANCHOR SWIFT LOCK ×2 IMPLANT
BAG COUNTER SPONGE SURGICOUNT (BAG) ×1 IMPLANT
BAG SPNG CNTER NS LX DISP (BAG) ×1
CANISTER SUCT 3000ML PPV (MISCELLANEOUS) ×3 IMPLANT
CHARGER BATTERY NEUROSTIM ABT (NEUROSURGERY SUPPLIES) ×1 IMPLANT
CLSR STERI-STRIP ANTIMIC 1/2X4 (GAUZE/BANDAGES/DRESSINGS) ×4 IMPLANT
CONTROLLER NEUROSTIM PATIENT (NEUROSURGERY SUPPLIES) ×1 IMPLANT
COVER MAYO STAND STRL (DRAPES) ×3 IMPLANT
COVER PROBE W GEL 5X96 (DRAPES) IMPLANT
COVER SURGICAL LIGHT HANDLE (MISCELLANEOUS) ×3 IMPLANT
DERMABOND ADVANCED (GAUZE/BANDAGES/DRESSINGS)
DERMABOND ADVANCED .7 DNX12 (GAUZE/BANDAGES/DRESSINGS) IMPLANT
DRAIN CHANNEL 15F RND FF W/TCR (WOUND CARE) IMPLANT
DRAPE C-ARM 42X72 X-RAY (DRAPES) ×3 IMPLANT
DRAPE SURG 17X23 STRL (DRAPES) ×2 IMPLANT
DRAPE U-SHAPE 47X51 STRL (DRAPES) ×4 IMPLANT
DRSG OPSITE POSTOP 4X6 (GAUZE/BANDAGES/DRESSINGS) ×4 IMPLANT
DURAPREP 26ML APPLICATOR (WOUND CARE) ×3 IMPLANT
ELECT BLADE 4.0 EZ CLEAN MEGAD (MISCELLANEOUS)
ELECT CAUTERY BLADE 6.4 (BLADE) IMPLANT
ELECT PENCIL ROCKER SW 15FT (MISCELLANEOUS) ×3 IMPLANT
ELECT REM PT RETURN 9FT ADLT (ELECTROSURGICAL) ×2
ELECTRODE BLDE 4.0 EZ CLN MEGD (MISCELLANEOUS) IMPLANT
ELECTRODE REM PT RTRN 9FT ADLT (ELECTROSURGICAL) ×2 IMPLANT
GENERATOR NEUROSTIM ETERNA 16 (Generator) ×1 IMPLANT
GLOVE SURG ENC MOIS LTX SZ7 (GLOVE) ×3 IMPLANT
GLOVE SURG GAMMEX LF SZ8.5 (GLOVE) ×3 IMPLANT
GLOVE SURG UNDER POLY LF SZ7 (GLOVE) ×3 IMPLANT
GLOVE SURG UNDER POLY LF SZ8.5 (GLOVE) ×3 IMPLANT
GOWN STRL REUS W/ TWL LRG LVL3 (GOWN DISPOSABLE) ×4 IMPLANT
GOWN STRL REUS W/TWL 2XL LVL3 (GOWN DISPOSABLE) ×3 IMPLANT
GOWN STRL REUS W/TWL LRG LVL3 (GOWN DISPOSABLE) ×4
KIT BASIN OR (CUSTOM PROCEDURE TRAY) ×3 IMPLANT
KIT CHARGER NEUROSTIM ABT (NEUROSURGERY SUPPLIES) ×1 IMPLANT
KIT TURNOVER KIT B (KITS) ×3 IMPLANT
LEAD OCTRODE GEN 8CH 60CM (Lead) ×2 IMPLANT
MAGNET NEUROSTIM ETERNA PAT (NEUROSURGERY SUPPLIES) ×1 IMPLANT
NDL SPNL 18GX3.5 QUINCKE PK (NEEDLE) ×2 IMPLANT
NEEDLE 22X1 1/2 (OR ONLY) (NEEDLE) ×3 IMPLANT
NEEDLE SPNL 18GX3.5 QUINCKE PK (NEEDLE) ×2 IMPLANT
NS IRRIG 1000ML POUR BTL (IV SOLUTION) ×3 IMPLANT
PACK LAMINECTOMY ORTHO (CUSTOM PROCEDURE TRAY) ×3 IMPLANT
PACK UNIVERSAL I (CUSTOM PROCEDURE TRAY) ×3 IMPLANT
PAD ARMBOARD 7.5X6 YLW CONV (MISCELLANEOUS) ×9 IMPLANT
SPATULA SILICONE BRAIN 10MM (MISCELLANEOUS) IMPLANT
SPONGE SURGIFOAM ABS GEL 100 (HEMOSTASIS) ×2 IMPLANT
SPONGE T-LAP 4X18 ~~LOC~~+RFID (SPONGE) ×3 IMPLANT
STAPLER VISISTAT 35W (STAPLE) ×1 IMPLANT
SURGIFLO W/THROMBIN 8M KIT (HEMOSTASIS) ×3 IMPLANT
SUT BONE WAX W31G (SUTURE) ×3 IMPLANT
SUT ETHIBOND 2 OS 4 DA (SUTURE) ×3 IMPLANT
SUT MNCRL AB 3-0 PS2 18 (SUTURE) ×5 IMPLANT
SUT VIC AB 1 CT1 18XCR BRD 8 (SUTURE) ×4 IMPLANT
SUT VIC AB 1 CT1 8-18 (SUTURE) ×4
SUT VIC AB 2-0 CT1 18 (SUTURE) ×4 IMPLANT
SYR BULB IRRIG 60ML STRL (SYRINGE) ×3 IMPLANT
SYR CONTROL 10ML LL (SYRINGE) ×3 IMPLANT
TOWEL GREEN STERILE (TOWEL DISPOSABLE) ×3 IMPLANT
TOWEL GREEN STERILE FF (TOWEL DISPOSABLE) ×3 IMPLANT
WATER STERILE IRR 1000ML POUR (IV SOLUTION) ×2 IMPLANT
YANKAUER SUCT BULB TIP NO VENT (SUCTIONS) ×3 IMPLANT

## 2022-01-30 NOTE — Discharge Instructions (Signed)

## 2022-01-30 NOTE — Transfer of Care (Signed)
Immediate Anesthesia Transfer of Care Note ? ?Patient: [name] ? ?Procedure(s) Performed: PLACEMENT OF SPINAL CORD STIMULATOR (Spine Lumbar) ? ?Patient Location: PACU ? ?Anesthesia Type:General ? ?Level of Consciousness: awake, drowsy, patient cooperative and responds to stimulation ? ?Airway & Oxygen Therapy: Patient Spontanous Breathing and Patient connected to face mask oxygen ? ?Post-op Assessment: Report given to RN, Post -op Vital signs reviewed and stable and Patient moving all extremities X 4 ? ?Post vital signs: Reviewed and stable ? ?Last Vitals:  ?Vitals Value Taken Time  ?BP 130/66 01/30/22 1001  ?Temp    ?Pulse 101 01/30/22 1001  ?Resp 19 01/30/22 1001  ?SpO2 100 % 01/30/22 1001  ?Vitals shown include unvalidated device data. ? ?Last Pain:  ?Vitals:  ? 01/30/22 0641  ?TempSrc:   ?PainSc: 8   ?   ? ?Patients Stated Pain Goal: 3 (01/30/22 9802) ? ?Complications: No notable events documented. ?

## 2022-01-30 NOTE — Brief Op Note (Signed)
01/30/2022 ? ?10:07 AM ? ?PATIENT:  [name]  61 y.o. female ? ?PRE-OPERATIVE DIAGNOSIS:  Chronic lumbar back pain, radicular leg pain ? ?POST-OPERATIVE DIAGNOSIS:  Chronic lumbar back pain, radicular leg pain ? ?PROCEDURE:  Procedure(s) with comments: ?PLACEMENT OF SPINAL CORD STIMULATOR (N/A) - 3 C-BED ? ?SURGEON:  Surgeon(s) and Role: ?   Melina Schools, MD - Primary ? ?PHYSICIAN ASSISTANT:  ? ?ASSISTANTS: none  ? ?ANESTHESIA:   general ? ?EBL:  75 mL  ? ?BLOOD ADMINISTERED:none ? ?DRAINS: none  ? ?LOCAL MEDICATIONS USED:  MARCAINE    ? ?SPECIMEN:  No Specimen ? ?DISPOSITION OF SPECIMEN:  PATHOLOGY ? ?COUNTS:  YES ? ?TOURNIQUET:  * No tourniquets in log * ? ?DICTATION: .Dragon Dictation ? ?PLAN OF CARE: Admit for overnight observation ? ?PATIENT DISPOSITION:  PACU - hemodynamically stable. ?  ?

## 2022-01-30 NOTE — Anesthesia Procedure Notes (Signed)
Procedure Name: Intubation ?Date/Time: 01/30/2022 7:38 AM ?Performed by: Cathren Harsh, CRNA ?Pre-anesthesia Checklist: Patient identified, Emergency Drugs available, Suction available and Patient being monitored ?Patient Re-evaluated:Patient Re-evaluated prior to induction ?Oxygen Delivery Method: Circle System Utilized ?Preoxygenation: Pre-oxygenation with 100% oxygen ?Induction Type: IV induction ?Ventilation: Mask ventilation without difficulty ?Laryngoscope Size: Mac and 3 ?Grade View: Grade I ?Tube type: Oral ?Tube size: 7.0 mm ?Number of attempts: 1 ?Airway Equipment and Method: Stylet and Oral airway ?Placement Confirmation: ETT inserted through vocal cords under direct vision, positive ETCO2 and breath sounds checked- equal and bilateral ?Secured at: 21 cm ?Tube secured with: Tape ?Dental Injury: Teeth and Oropharynx as per pre-operative assessment  ? ? ? ? ?

## 2022-01-30 NOTE — Op Note (Signed)
OPERATIVE REPORT ? ?DATE OF SURGERY: 01/30/2022 ? ?PATIENT NAME:  [name] ?MRN: [id number] ?DOB: 1961-05-20 ? ?PCP: Osker Mason, MD ? ?PRE-OPERATIVE DIAGNOSIS: Chronic pain syndrome.  Status post successful spinal cord stimulator trial ? ?POST-OPERATIVE DIAGNOSIS: Same ? ?PROCEDURE:   ?Implantation of spinal cord stimulator ? ?SURGEON:  Melina Schools, MD ? ?PHYSICIAN ASSISTANT: None ? ?ANESTHESIA:   General ? ?EBL: 75 ml  ? ?Complications: None ? ?Implants: Abbott spine: Octrode stimulator lead x 2.  Eterna IPG generator ? ?BRIEF HISTORY: ?[name] is a 61 y.o. female who presents to my office after having a successful spinal cord stimulator trial today to chronic pain.  As result of the successful trial she presents today for permanent implantation.  All appropriate risks, benefits and alternatives to surgery were discussed with the patient. ? ?PROCEDURE DETAILS: ?Patient was brought into the operating room and was properly positioned on the operating room table.  After induction with general anesthesia the patient was endotracheally intubated.  A timeout was taken to confirm all important data: including patient, procedure, and the level. Teds, SCD's were applied.  ? ?Patient was turned prone onto the Wilson frame and all bony prominences were well-padded.  The back was then prepped and draped in a standard fashion.  Using fluoroscopy identified the inferior aspect of the T10 vertebral body and the superior aspect of the T12 vertebral body I mapped out a midline incision and infiltrated with quarter percent Marcaine with epinephrine.  Midline incision was made and sharp dissection was carried out down to the deep fascia.  The deep fascia was sharply incised and I stripped the paraspinal muscles to expose the spinous process and lamina.  Identified the T10-11 intervertebral space and confirmed this with fluoroscopy. ? ?Using a double-action Leksell rongeur I remove the inferior third of the T10 spinous  process.  A laminotomy was created using a 2 mm Kerrison rongeur.  I then dissected through the central raphae of the ligamentum flavum and resected the ligamentum flavum with my 2 mm Kerrison rongeur.  The dorsal surface of the thecal sac was now clearly visible.  The lead was then passed to the inferior portion of the T8 vertebral body.  A second lead was then placed just to the right of this.  This lead also stopped at the superior aspect of T8.  The spinal cord representative then confirmed that the leads were properly placed and would cover the same area that was covered during the trial. ? ?At this point I secured the 2 leads directly to the T11 spinous process with an Ethibond suture.  With the leads secured I then confirmed satisfactory position in both the AP and lateral planes.  The leads were then wrapped around the T11 spinous process.  At this point both leads were secured into position.  I then made a right-sided incision for the battery and passed the leads from the thoracic incision site to the battery incision site.  The depth of the battery cavity was approximately 2-1/2 cm.  The leads were then secured into the battery and tightened according manufacture standards.  The excess lead was placed on the undersurface of the battery and the battery was placed into the pocket.  This battery was then secured directly to the deep fascia with two #1 Vicryl sutures.  At this point both wounds were copiously irrigated with normal saline.  Hemostasis was confirmed and the wounds were closed in a layered fashion.  Both wounds were  closed with interrupted #1 Vicryl sutures for the deep fascia, a running 0 Vicryl for the thoracic wound, followed by interrupted 2-0 Vicryl sutures and a 3-0 Monocryl for the skin.  Steri-Strips and dry dressings were applied and the patient was ultimately extubated and transferred the PACU without incident.  The end of the case all needle sponge counts were correct. ? ?Melina Schools, MD ?01/30/2022 ?9:52 AM ? ? ?

## 2022-01-30 NOTE — Anesthesia Postprocedure Evaluation (Signed)
Anesthesia Post Note ? ?Patient: [name] ? ?Procedure(s) Performed: PLACEMENT OF SPINAL CORD STIMULATOR (Spine Lumbar) ? ?  ? ?Patient location during evaluation: PACU ?Anesthesia Type: General ?Level of consciousness: awake ?Pain management: pain level controlled ?Respiratory status: spontaneous breathing ?Cardiovascular status: stable ?Postop Assessment: no apparent nausea or vomiting ?Anesthetic complications: no ? ? ?No notable events documented. ? ?Last Vitals:  ?Vitals:  ? 01/30/22 1130 01/30/22 1217  ?BP: 129/77 137/79  ?Pulse: 95 88  ?Resp: 17 18  ?Temp: 36.7 ?C 36.7 ?C  ?SpO2: 92% 94%  ?  ?Last Pain:  ?Vitals:  ? 01/30/22 1217  ?TempSrc: Oral  ?PainSc: 0-No pain  ? ? ?  ?  ?  ?  ?  ?  ? ?[name] ? ? ? ? ?

## 2022-01-31 DIAGNOSIS — M545 Low back pain, unspecified: Secondary | ICD-10-CM | POA: Diagnosis not present

## 2022-01-31 NOTE — Evaluation (Signed)
Occupational Therapy Evaluation ?Patient Details ?Name: [name] ?MRN: [id number] ?DOB: [birth year] ?Today's Date: 01/31/2022 ? ? ?History of Present Illness  61 yo female s/p spinal cord stimulator placement on 4/6. PMH including R TKA, L THA, CAD, bilateral carpal tunnel syndrome, OA, PTSD, and major depression.   ? ?Clinical Impression ?  ?PTA, pt was living with her sister and was Mod I with use of cane for mobility and increased time for ADLs. Currently, pt requires Supervision for ADLs and functional mobility using RW. Provided education and handout for back precautions, grooming, UB ADLs, LB ADLs, toileting, and functional transfers; pt demonstrated understanding. Pt presenting with decreased ROM and activity tolerance. Pt would benefit from further acute OT to facilitate safe dc. Recommend dc home once medically stable per physician. ?   ? ?Recommendations for follow up therapy are one component of a multi-disciplinary discharge planning process, led by the attending physician.  Recommendations may be updated based on patient status, additional functional criteria and insurance authorization.  ? ?Follow Up Recommendations ? No OT follow up  ?  ?Assistance Recommended at Discharge Intermittent Supervision/Assistance  ?Patient can return home with the following A little help with walking and/or transfers;Assistance with cooking/housework;Assist for transportation ? ?  ?Functional Status Assessment ? Patient has had a recent decline in their functional status and demonstrates the ability to make significant improvements in function in a reasonable and predictable amount of time.  ?Equipment Recommendations ? Tub/shower seat (or 3n1)  ?  ?Recommendations for Other Services   ? ? ?  ?Precautions / Restrictions Precautions ?Precautions: Fall  ? ?  ? ?Mobility Bed Mobility ?Overal bed mobility: Needs Assistance ?Bed Mobility: Sit to Sidelying ?  ?  ?  ?  ?Sit to sidelying: Supervision ?General bed mobility  comments: Supervision for safety. Education on log roll ?  ? ?Transfers ?Overall transfer level: Needs assistance ?Equipment used: None ?Transfers: Sit to/from Stand ?Sit to Stand: Supervision ?  ?  ?  ?  ?  ?General transfer comment: supervision for safety ?  ? ?  ?Balance Overall balance assessment: Mild deficits observed, not formally tested ?  ?  ?  ?  ?  ?  ?  ?  ?  ?  ?  ?  ?  ?  ?  ?  ?  ?  ?   ? ?ADL either performed or assessed with clinical judgement  ? ?ADL Overall ADL's : Needs assistance/impaired ?  ?  ?  ?  ?  ?  ?  ?  ?  ?  ?  ?  ?  ?  ?  ?  ?  ?  ?  ?General ADL Comments: Performing ADLs and functional mobility at Supervision level. Providing education on compensatory tehcniques for grooming, LB ADLs, and toileting. Pt with difficulty reaching back for toileting. Provided handout for toilet aid with price and where to purchase.  ? ? ? ?Vision Baseline Vision/History: 1 Wears glasses ?   ?   ?Perception   ?  ?Praxis   ?  ? ?Pertinent Vitals/Pain    ? ? ? ?Hand Dominance   ?  ?Extremity/Trunk Assessment Upper Extremity Assessment ?Upper Extremity Assessment: Overall WFL for tasks assessed ?  ?Lower Extremity Assessment ?Lower Extremity Assessment: Defer to PT evaluation ?  ?Cervical / Trunk Assessment ?Cervical / Trunk Assessment: Back Surgery ?  ?Communication Communication ?Communication: No difficulties ?  ?Cognition Arousal/Alertness: Awake/alert ?Behavior During Therapy: Flat affect ?Overall Cognitive Status: Within Functional  Limits for tasks assessed ?  ?  ?  ?  ?  ?  ?  ?  ?  ?  ?  ?  ?  ?  ?  ?  ?General Comments: Reporting feeling nauseous ?  ?  ?General Comments  Pt with decreased activity tolerance and would benefit from 3n1 or shower seat for seated bathes ? ?  ?Exercises   ?  ?Shoulder Instructions    ? ? ?Home Living Family/patient expects to be discharged to:: Private residence ?Living Arrangements: Other relatives (Sister) ?Available Help at Discharge: Family ?Type of Home: House ?  ?   ?  ?  ?  ?  ?Bathroom Shower/Tub: Walk-in shower ?  ?Bathroom Toilet: Standard ?  ?  ?Home Equipment: Conservation officer, nature (2 wheels);Cane - single point;Grab bars - tub/shower ?  ?  ?  ? ?  ?Prior Functioning/Environment Prior Level of Function : Independent/Modified Independent ?  ?  ?  ?  ?  ?  ?Mobility Comments: Using cane for mobility. ?ADLs Comments: Performing ADLs and light IADLs ?  ? ?  ?  ?OT Problem List: Decreased range of motion;Decreased activity tolerance;Impaired balance (sitting and/or standing) ?  ?   ?OT Treatment/Interventions: Self-care/ADL training;Therapeutic exercise;Energy conservation;DME and/or AE instruction  ?  ?OT Goals(Current goals can be found in the care plan section) Acute Rehab OT Goals ?Patient Stated Goal: Get stronger ?OT Goal Formulation: With patient ?Time For Goal Achievement: 02/14/22 ?Potential to Achieve Goals: Good  ?OT Frequency: Min 2X/week ?  ? ?Co-evaluation   ?  ?  ?  ?  ? ?  ?AM-PAC OT "6 Clicks" Daily Activity     ?Outcome Measure Help from another person eating meals?: None ?Help from another person taking care of personal grooming?: None ?Help from another person toileting, which includes using toliet, bedpan, or urinal?: A Little ?Help from another person bathing (including washing, rinsing, drying)?: A Little ?Help from another person to put on and taking off regular upper body clothing?: None ?Help from another person to put on and taking off regular lower body clothing?: A Little ?6 Click Score: 21 ?  ?End of Session Nurse Communication: Mobility status ? ?Activity Tolerance: Patient tolerated treatment well;Other (comment) (limited by nausea) ?Patient left: in bed;with call bell/phone within reach ? ?OT Visit Diagnosis: Unsteadiness on feet (R26.81);Other abnormalities of gait and mobility (R26.89);Muscle weakness (generalized) (M62.81)  ?              ?Time: [phone] ?OT Time Calculation (min): 22 min ?Charges:  OT General Charges ?$OT Visit: 1 Visit ?OT  Evaluation ?$OT Eval Low Complexity: 1 Low ? ?Ashly Goethe MSOT, OTR/L ?Acute Rehab ?Pager: [phone] ?Office: [phone] ? ?Eulalio Reamy M Gatsby Chismar ?01/31/2022, 9:07 AM ?

## 2022-01-31 NOTE — Evaluation (Signed)
Physical Therapy Evaluation ?Patient Details ?Name: [name] ?MRN: [id number] ?DOB: September 08, 1961 ?Today's Date: 01/31/2022 ? ?History of Present Illness ? 61 yo female s/p spinal cord stimulator placement on 4/6. PMH including R TKA, L THA, CAD, bilateral carpal tunnel syndrome, OA, PTSD, and major depression. ?  ?Clinical Impression ? Pt in bed upon arrival of PT, agreeable to evaluation at this time. Prior to admission the pt was mobilizing with use of SPC and reports she was independent with mobility and stairs, volunteering outside of the home 2x/week, but progressively limited by pain. The pt now presents with limitations in functional mobility, strength, dynamic stability, and endurance due to above dx and pain, and will continue to benefit from skilled PT to address these deficits. The pt was able to complete bed mobility, sit-stand transfers, and hallway ambulation with minG-supervision level, but does move slowly and is very guarded with movements at this time. She had no overt LOB with hallway ambulation, but self-limited to ~100 ft and navigation of 2 stairs to mimic home environment. The pt is at increased risk of falls and will benefit from continued skilled PT to improve functional power and strength for transfers as well as endurance, but is safe to d/c home with family assistance when medically cleared.  ? ?   ?   ? ?Recommendations for follow up therapy are one component of a multi-disciplinary discharge planning process, led by the attending physician.  Recommendations may be updated based on patient status, additional functional criteria and insurance authorization. ? ?Follow Up Recommendations Home health PT ? ?  ?Assistance Recommended at Discharge Intermittent Supervision/Assistance  ?Patient can return home with the following ? Assistance with cooking/housework;Help with stairs or ramp for entrance;Assist for transportation ? ?  ?Equipment Recommendations None recommended by PT   ?Recommendations for Other Services ?    ?  ?Functional Status Assessment Patient has had a recent decline in their functional status and demonstrates the ability to make significant improvements in function in a reasonable and predictable amount of time.  ? ?  ?Precautions / Restrictions Precautions ?Precautions: Fall ?Precaution Comments: nauseous with movement ?Restrictions ?Weight Bearing Restrictions: No  ? ?  ? ?Mobility ? Bed Mobility ?Overal bed mobility: Needs Assistance ?Bed Mobility: Sit to Sidelying ?  ?  ?  ?  ?Sit to sidelying: Supervision ?General bed mobility comments: Supervision for safety. Education on log roll ?  ? ?Transfers ?Overall transfer level: Needs assistance ?Equipment used: Rolling walker (2 wheels) ?Transfers: Sit to/from Stand ?Sit to Stand: Supervision ?  ?  ?  ?  ?  ?General transfer comment: supervision for safety ?  ? ?Ambulation/Gait ?Ambulation/Gait assistance: Supervision ?Gait Distance (Feet): 100 Feet ?Assistive device: Rolling walker (2 wheels) ?Gait Pattern/deviations: Step-through pattern, Decreased stride length, Trunk flexed ?Gait velocity: decreased ?Gait velocity interpretation: <1.31 ft/sec, indicative of household ambulator ?  ?General Gait Details: small steps with trunk flexed and slow gait. no overt LOB ? ?Stairs ?Stairs: Yes ?Stairs assistance: Min guard ?Stair Management: One rail Right, Step to pattern, Forwards ?Number of Stairs: 2 ?General stair comments: LLE leading ? ?  ? ?Balance Overall balance assessment: Mild deficits observed, not formally tested ?  ?  ?  ?  ?  ?  ?  ?  ?  ?  ?  ?  ?  ?  ?  ?  ?  ?  ?   ? ? ? ?Pertinent Vitals/Pain Pain Assessment ?Pain Assessment: Faces ?Faces Pain Scale: Hurts even  more ?Pain Location: back, nauseous ?Pain Descriptors / Indicators: Discomfort ?Pain Intervention(s): Limited activity within patient's tolerance, Monitored during session, Repositioned  ? ? ?Home Living Family/patient expects to be discharged to::  Private residence ?Living Arrangements: Other relatives (Sister) ?Available Help at Discharge: Family ?Type of Home: House ?Home Access: Stairs to enter ?Entrance Stairs-Rails: Right;Left ?Entrance Stairs-Number of Steps: 2 ?Alternate Level Stairs-Number of Steps: 15 ?Home Layout: Two level ?Home Equipment: Conservation officer, nature (2 wheels);Cane - single point;Grab bars - tub/shower ?   ?  ?Prior Function Prior Level of Function : Independent/Modified Independent ?  ?  ?  ?  ?  ?  ?Mobility Comments: Using cane for mobility. ?ADLs Comments: Performing ADLs and light IADLs ?  ? ? ?Hand Dominance  ? Dominant Hand: Right ? ?  ?Extremity/Trunk Assessment  ? Upper Extremity Assessment ?Upper Extremity Assessment: Defer to OT evaluation ?  ? ?Lower Extremity Assessment ?Lower Extremity Assessment: RLE deficits/detail ?RLE Deficits / Details: grossly 4+/5, limited by some pain. pt reports buckling 3-4 times in last few months ?RLE: Unable to fully assess due to pain ?RLE Sensation: WNL ?RLE Coordination: WNL ?  ? ?Cervical / Trunk Assessment ?Cervical / Trunk Assessment: Back Surgery  ?Communication  ? Communication: No difficulties  ?Cognition Arousal/Alertness: Awake/alert ?Behavior During Therapy: Flat affect ?Overall Cognitive Status: Within Functional Limits for tasks assessed ?  ?  ?  ?  ?  ?  ?  ?  ?  ?  ?  ?  ?  ?  ?  ?  ?General Comments: Reporting feeling nauseous ?  ?  ? ?  ?General Comments General comments (skin integrity, edema, etc.): VSS on RA, pt reports nausea but no vomiting with session ? ?  ?Exercises    ? ?Assessment/Plan  ?  ?PT Assessment Patient needs continued PT services  ?PT Problem List Decreased strength;Decreased activity tolerance;Decreased balance;Decreased mobility;Pain ? ?   ?  ?PT Treatment Interventions DME instruction;Gait training;Stair training;Functional mobility training;Therapeutic activities;Therapeutic exercise;Balance training;Patient/family education   ? ?PT Goals (Current goals can  be found in the Care Plan section)  ?Acute Rehab PT Goals ?Patient Stated Goal: return home and reduce nausea ?PT Goal Formulation: With patient ?Time For Goal Achievement: 02/14/22 ?Potential to Achieve Goals: Good ? ?  ?Frequency Min 5X/week ?  ? ? ?   ?AM-PAC PT "6 Clicks" Mobility  ?Outcome Measure Help needed turning from your back to your side while in a flat bed without using bedrails?: A Little ?Help needed moving from lying on your back to sitting on the side of a flat bed without using bedrails?: A Little ?Help needed moving to and from a bed to a chair (including a wheelchair)?: A Little ?Help needed standing up from a chair using your arms (e.g., wheelchair or bedside chair)?: A Little ?Help needed to walk in hospital room?: A Little ?Help needed climbing 3-5 steps with a railing? : A Little ?6 Click Score: 18 ? ?  ?End of Session Equipment Utilized During Treatment: Gait belt ?Activity Tolerance: Patient tolerated treatment well ?Patient left: in bed;with call bell/phone within reach ?Nurse Communication: Mobility status ?PT Visit Diagnosis: Other abnormalities of gait and mobility (R26.89);Muscle weakness (generalized) (M62.81);Pain ?Pain - part of body:  (back) ?  ? ?Time: [phone] ?PT Time Calculation (min) (ACUTE ONLY): 19 min ? ? ?Charges:   PT Evaluation ?$PT Eval Low Complexity: 1 Low ?  ?  ?   ? ? ?West Carbo, PT, DPT  ? ?Acute Rehabilitation  Department ?Pager #: [phone] - [phone] ? ?Sandra Cockayne ?01/31/2022, 10:02 AM ? ?

## 2022-01-31 NOTE — Progress Notes (Signed)
Patient was recommended for HHPT at discharge. RN and Case Manager communicated with patient she will start her therapy after her follow-up visit with Dr. Rolena Infante, and [facility] with her. ?

## 2022-01-31 NOTE — Progress Notes (Signed)
Patient awaiting transport via wheelchair by volunteer for discharge to home; in no acute distress nor complaints of pain nor discomfort; incision on her back with honeycomb dressing and is clean, dry and intact; room was checked for all her belongings and was carried by sister to their vehicle; discharge instructions concerning her medication, incision care, follow up appointment and when to call the doctor  as needed were discussed with patient and sister by RN and both expressed understanding on the instructions given.  ?

## 2022-01-31 NOTE — Progress Notes (Signed)
Subjective: ?1 Day Post-Op Procedure(s) (LRB): ?PLACEMENT OF SPINAL CORD STIMULATOR (N/A) ?Patient reports pain as mild.  Reports nausea this morning after being up and walking. No other c/o. Reports no issues voiding. Did receive meds for nausea. ? ?Objective: ?Vital signs in last 24 hours: ?Temp:  [97.8 ?F (36.6 ?C)-98.6 ?F (37 ?C)] 98 ?F (36.7 ?C) (04/07 0547) ?Pulse Rate:  [88-110] 90 (04/07 0547) ?Resp:  [14-20] 18 (04/07 0547) ?BP: (121-152)/(60-79) 121/62 (04/07 0547) ?SpO2:  [92 %-100 %] 93 % (04/07 0547) ? ?Intake/Output from previous day: ?04/06 0701 - 04/07 0700 ?In: 600 [I.V.:600] ?Out: 20 [Blood:75] ?Intake/Output this shift: ?No intake/output data recorded. ? ?No results for input(s): HGB in the last 72 hours. ?No results for input(s): WBC, RBC, HCT, PLT in the last 72 hours. ?No results for input(s): NA, K, CL, CO2, BUN, CREATININE, GLUCOSE, CALCIUM in the last 72 hours. ?No results for input(s): LABPT, INR in the last 72 hours. ? ?Neurologically intact ?ABD soft ?Neurovascular intact ?Sensation intact distally ?Intact pulses distally ?Dorsiflexion/Plantar flexion intact ?Incision: dressing C/D/I and no drainage ?No cellulitis present ?Compartment soft ?No calf pain or sign of DVT ? ? ?Assessment/Plan: ?1 Day Post-Op Procedure(s) (LRB): ?PLACEMENT OF SPINAL CORD STIMULATOR (N/A) ?Advance diet ?Up with therapy ?D/C IV fluids ?Plan D/C today as long as nausea is resolved ?Zofran on D/C - scripts on chart ?Discussed with Dr Rolena Infante ? ? ?Cecilie Kicks ?01/31/2022, 8:11 AM ? ?

## 2022-01-31 NOTE — Plan of Care (Signed)

## 2022-02-02 ENCOUNTER — Encounter (HOSPITAL_COMMUNITY): Payer: Self-pay | Admitting: Orthopedic Surgery

## 2022-02-03 NOTE — Discharge Summary (Signed)
Physician Discharge Summary ?  ?Patient ID: ?[name] ?MRN: [id number] ?DOB/AGE: 60/31/1962 60 y.o. ? ?Admit date: 01/30/2022 ?Discharge date: 02/03/2022 ? ?Primary Diagnosis:  ? Chronic lumbar back pain, radicular leg pain ? ?Admission Diagnoses:  ?Past Medical History:  ?Diagnosis Date  ? Coronary artery disease   ? Mild by cath 2009  ? GERD (gastroesophageal reflux disease)   ? H/O hiatal hernia   ? History of carpal tunnel syndrome   ? Bilateral  ? History of iron deficiency anemia   ? History of MRSA infection   ? Hypertension   ? Impingement syndrome of left shoulder   ? Knee pain   ? r/knee  ? OA (osteoarthritis)   ? Pre-diabetes   ? PTSD (post-traumatic stress disorder)   ? Severe major depression with psychotic features ([place])   ? ?Discharge Diagnoses:   ?Principal Problem: ?  Chronic pain ? ?Procedure:  ?Procedure(s) (LRB): ?PLACEMENT OF SPINAL CORD STIMULATOR (N/A)  ? ?Consults: None ? ?HPI:  ?See H&P ?  ? ?Laboratory Data: ?Hospital Outpatient Visit on 01/27/2022  ?Component Date Value Ref Range Status  ? WBC 01/27/2022 6.6  4.0 - 10.5 K/uL Final  ? RBC 01/27/2022 4.69  3.87 - 5.11 MIL/uL Final  ? Hemoglobin 01/27/2022 12.9  12.0 - 15.0 g/dL Final  ? HCT 01/27/2022 40.1  36.0 - 46.0 % Final  ? MCV 01/27/2022 85.5  80.0 - 100.0 fL Final  ? MCH 01/27/2022 27.5  26.0 - 34.0 pg Final  ? MCHC 01/27/2022 32.2  30.0 - 36.0 g/dL Final  ? RDW 01/27/2022 13.5  11.5 - 15.5 % Final  ? Platelets 01/27/2022 318  150 - 400 K/uL Final  ? nRBC 01/27/2022 0.0  0.0 - 0.2 % Final  ? Performed at [facility], [place] [address], [place], [place] [address]  ? Sodium 01/27/2022 143  135 - 145 mmol/L Final  ? Potassium 01/27/2022 3.7  3.5 - 5.1 mmol/L Final  ? Chloride 01/27/2022 111  98 - 111 mmol/L Final  ? CO2 01/27/2022 23  22 - 32 mmol/L Final  ? Glucose, Bld 01/27/2022 110 (H)  70 - 99 mg/dL Final  ? Glucose reference range applies only to samples taken after fasting for at least 8 hours.  ? BUN 01/27/2022 10  6 - 20  mg/dL Final  ? Creatinine, Ser 01/27/2022 1.04 (H)  0.44 - 1.00 mg/dL Final  ? Calcium 01/27/2022 8.6 (L)  8.9 - 10.3 mg/dL Final  ? GFR, Estimated 01/27/2022 >60  >60 mL/min Final  ? Comment: (NOTE) ?Calculated using the CKD-EPI Creatinine Equation (2021) ?  ? Anion gap 01/27/2022 9  5 - 15 Final  ? Performed at [facility] [address] [place], [place] [address]  ? Color, Urine 01/27/2022 YELLOW  YELLOW Final  ? APPearance 01/27/2022 CLEAR  CLEAR Final  ? Specific Gravity, Urine 01/27/2022 1.024  1.005 - 1.030 Final  ? pH 01/27/2022 5.0  5.0 - 8.0 Final  ? Glucose, UA 01/27/2022 NEGATIVE  NEGATIVE mg/dL Final  ? Hgb urine dipstick 01/27/2022 NEGATIVE  NEGATIVE Final  ? Bilirubin Urine 01/27/2022 NEGATIVE  NEGATIVE Final  ? Ketones, ur 01/27/2022 NEGATIVE  NEGATIVE mg/dL Final  ? Protein, ur 01/27/2022 NEGATIVE  NEGATIVE mg/dL Final  ? Nitrite 01/27/2022 NEGATIVE  NEGATIVE Final  ? Leukocytes,Ua 01/27/2022 NEGATIVE  NEGATIVE Final  ? Performed at [facility], [place] [address], [place], [place] [address]  ? MRSA, PCR 01/27/2022 NEGATIVE  NEGATIVE Final  ? Staphylococcus aureus 01/27/2022 NEGATIVE  NEGATIVE Final  ? Comment: (NOTE) ?The Xpert SA Assay (FDA approved for NASAL specimens in patients 60 ?years of age and older), is one component of a comprehensive ?surveillance program. It is not intended to diagnose infection nor to ?guide or monitor treatment. ?Performed at [facility], [place] [address], [place], [place] ?[address] ?  ? Glucose-Capillary 01/27/2022 150 (H)  70 - 99 mg/dL Final  ? Glucose reference range applies only to samples taken after fasting for at least 8 hours.  ? ?No results for input(s): HGB in the last 72 hours. ?No results for input(s): WBC, RBC, HCT, PLT in the last 72 hours. ?No results for input(s): NA, K, CL, CO2, BUN, CREATININE, GLUCOSE, CALCIUM in the last 72 hours. ?No results for input(s): LABPT, INR in the last 72 hours. ? ?X-Rays:DG Lumbar Spine 2-3  Views ? ?Result Date: 01/30/2022 ?CLINICAL DATA:  Placement of spinal cord stimulator. EXAM: LUMBAR SPINE - 2-3 VIEW COMPARISON:  None available FLUOROSCOPY: Radiation Exposure Index (as provided by the fluoroscopic device): 56.94 mGy Kerma FINDINGS: Two fluoroscopic images demonstrate a spinal cord stimulator device in the thoracic spine. Spinal levels cannot be calculated based on these images. IMPRESSION: Fluoroscopy for spinal cord stimulator device placement. Electronically Signed   By: Markus Daft M.D.   On: 01/30/2022 09:43  ? ?DG C-Arm 1-60 Min-No Report ? ?Result Date: 01/30/2022 ?Fluoroscopy was utilized by the requesting physician.  No radiographic interpretation.  ? ?DG C-Arm 1-60 Min-No Report ? ?Result Date: 01/30/2022 ?Fluoroscopy was utilized by the requesting physician.  No radiographic interpretation.   ? ?EKG: ?Orders placed or performed during the hospital encounter of 01/27/22  ? EKG 12-Lead  ? EKG 12-Lead  ?  ? ?Hospital Course: Patient was admitted to [facility] and taken to the OR and underwent the above state procedure without complications.  Patient tolerated the procedure well and was later transferred to the recovery room and then to the orthopaedic floor for postoperative care.  They were given PO and IV analgesics for pain control following their surgery.  They were given 24 hours of postoperative antibiotics.   PT was consulted postop to assist with mobility and transfers.  The patient was allowed to be WBAT with therapy and was taught back precautions. Discharge planning was consulted to help with postop disposition and equipment needs.  Patient had a good night on the evening of surgery and started to get up OOB with therapy on day one. Patient was seen in rounds and was ready to go home on day one.  They were given discharge instructions and dressing directions.  They were instructed on when to follow up in the office with Dr. Tonita Cong. ? ? ?Diet: Regular  diet ?Activity:WBAT ?Follow-up:in 10-14 days ?Disposition - Home ?Discharged Condition: good ? ? ?Discharge Instructions   ? ? Call MD / Call 911   Complete by: As directed ?  ? If you experience chest pain or shortness of breath, CALL 911 and be transported to the hospital emergency room.  If you develope a fever above 101 F, pus (white drainage) or increased drainage or redness at the wound, or calf pain, call your surgeon's office.  ? Constipation Prevention   Complete by: As directed ?  ? Drink plenty of fluids.  Prune juice may be helpful.  You may use a stool softener, such as Colace (over the counter) 100 mg twice a day.  Use  MiraLax (over the counter) for constipation as needed.  ? Diet - low sodium heart healthy   Complete by: As directed ?  ? Incentive spirometry RT   Complete by: As directed ?  ? Increase activity slowly as tolerated   Complete by: As directed ?  ? Post-operative opioid taper instructions:   Complete by: As directed ?  ? POST-OPERATIVE OPIOID TAPER INSTRUCTIONS: ?It is important to wean off of your opioid medication as soon as possible. If you do not need pain medication after your surgery it is ok to stop day one. ?Opioids include: ?Codeine, Hydrocodone(Norco, Vicodin), Oxycodone(Percocet, oxycontin) and hydromorphone amongst others.  ?Long term and even short term use of opiods can cause: ?Increased pain response ?Dependence ?Constipation ?Depression ?Respiratory depression ?And more.  ?Withdrawal symptoms can include ?Flu like symptoms ?Nausea, vomiting ?And more ?Techniques to manage these symptoms ?Hydrate well ?Eat regular healthy meals ?Stay active ?Use relaxation techniques(deep breathing, meditating, yoga) ?Do Not substitute Alcohol to help with tapering ?If you have been on opioids for less than two weeks and do not have pain than it is ok to stop all together.  ?Plan to wean off of opioids ?This plan should start within one week post op of your joint replacement. ?Maintain the  same interval or time between taking each dose and first decrease the dose.  ?Cut the total daily intake of opioids by one tablet each day ?Next start to increase the time between doses. ?The last dose that should be eliminated is the even

## 2023-09-07 NOTE — H&P (Signed)
TOTAL HIP ADMISSION H&P  Patient is admitted for right total hip arthroplasty.  Subjective:  Chief Complaint: Right hip pain  HPI: [name], 62 y.o. female, has a history of pain and functional disability in the right hip due to arthritis and patient has failed non-surgical conservative treatments for greater than 12 weeks to include NSAID's and/or analgesics, corticosteriod injections, and activity modification. Onset of symptoms was gradual, starting  several  years ago with gradually worsening course since that time. The patient noted no past surgery on the right hip. Patient currently rates pain in the right hip at 8 out of 10 with activity. Patient has night pain, worsening of pain with activity and weight bearing, pain that interfers with activities of daily living, and pain with passive range of motion. Patient has evidence of  bone-on-bone arthritis in the right hip  by imaging studies. This condition presents safety issues increasing the risk of falls. There is no current active infection.  Patient Active Problem List   Diagnosis Date Noted   Chronic pain 01/30/2022   OA (osteoarthritis) of hip 12/19/2020   Osteoarthritis of left hip 12/19/2020   OA (osteoarthritis) of knee 07/25/2019   Degenerative spondylolisthesis 07/10/2017   Spondylosis without myelopathy or radiculopathy, lumbar region 07/10/2017   Morbid obesity with BMI of 40.0-44.9, adult (HCC) 05/01/2016   Osteoarthritis of lumbar spine 05/01/2016   Disturbance in sleep behavior 12/20/2015   Atherosclerosis of native coronary artery of transplanted heart 12/20/2015   Cardiac chest pain 12/20/2015   Encounter for screening for malignant neoplasm of colon 12/03/2015   Impingement syndrome of shoulder 12/03/2015   HLD (hyperlipidemia) 12/03/2015   History of abdominal hernia 12/03/2015   Clinical depression 12/03/2015   Arteriosclerosis of coronary artery 12/03/2015   Allergic rhinitis 12/03/2015   Hypertension     GERD (gastroesophageal reflux disease)    H/O hiatal hernia    Severe major depression with psychotic features (HCC)    Impingement syndrome of left shoulder    Coronary artery disease     Past Medical History:  Diagnosis Date   Coronary artery disease    Mild by cath 2009   GERD (gastroesophageal reflux disease)    H/O hiatal hernia    History of carpal tunnel syndrome    Bilateral   History of iron deficiency anemia    History of MRSA infection    Hypertension    Impingement syndrome of left shoulder    Knee pain    r/knee   OA (osteoarthritis)    Pre-diabetes    PTSD (post-traumatic stress disorder)    Severe major depression with psychotic features (HCC)     Past Surgical History:  Procedure Laterality Date   ABDOMINAL HYSTERECTOMY     BILATERAL CARPAL TUNNEL RELEASE  2006  &  2009   CARDIAC CATHETERIZATION  01-20-2008   DR Anne Fu   MILD CAD/ RCA WITH UP TO 50% PROXIMAL /  DIFFUSE DISTAL IRREGULARITIES UP TO 30%/ NORMAL LVEF   COLONOSCOPY     epidural steroid injections     JOINT REPLACEMENT     right knee and left hip   SHOULDER SURGERY     Left   SPINAL CORD STIMULATOR INSERTION N/A 01/30/2022   Procedure: PLACEMENT OF SPINAL CORD STIMULATOR;  Surgeon: Venita Lick, MD;  Location: [facility] OR;  Service: Orthopedics;  Laterality: N/A;  3 C-BED   TONSILLECTOMY AND ADENOIDECTOMY  AS CHILD   AND PALETTE REPAIR   TOTAL ABDOMINAL HYSTERECTOMY W/  BILATERAL SALPINGOOPHORECTOMY  2001   TOTAL HIP ARTHROPLASTY Left 12/19/2020   Procedure: TOTAL HIP ARTHROPLASTY ANTERIOR APPROACH;  Surgeon: Ollen Gross, MD;  Location: [facility];  Service: Orthopedics;  Laterality: Left;    TOTAL KNEE ARTHROPLASTY Right 07/25/2019   Procedure: TOTAL KNEE ARTHROPLASTY;  Surgeon: Ollen Gross, MD;  Location: [facility];  Service: Orthopedics;  Laterality: Right;    UPPER GI ENDOSCOPY      Prior to Admission medications   Medication Sig Start Date End Date Taking? Authorizing Provider   amLODipine (NORVASC) 10 MG tablet Take 5 mg by mouth daily.    [provider]  atorvastatin (LIPITOR) 80 MG tablet Take 80 mg by mouth at bedtime.  12/25/15   [provider]  buPROPion (WELLBUTRIN XL) 150 MG 24 hr tablet Take 450 mg by mouth in the morning, at noon, and at bedtime.    [provider]  gabapentin (NEURONTIN) 300 MG capsule Take 1 capsule (300 mg total) by mouth 2 (two) times daily. 07/26/19   Cassandria Anger, PA-C  hydrOXYzine (ATARAX/VISTARIL) 25 MG tablet Take 25 mg by mouth 2 (two) times daily as needed for anxiety.    [provider]  ondansetron (ZOFRAN) 4 MG tablet Take 1 tablet (4 mg total) by mouth every 8 (eight) hours as needed for nausea or vomiting. 01/30/22   Venita Lick, MD  pantoprazole (PROTONIX) 40 MG tablet Take 40 mg by mouth daily.    [provider]  Phentermine-Topiramate (QSYMIA) 15-92 MG CP24 Take 1 tablet by mouth daily.    [provider]  prazosin (MINIPRESS) 1 MG capsule Take 1 mg by mouth at bedtime.    [provider]  QUEtiapine (SEROQUEL) 200 MG tablet Take 200 mg by mouth at bedtime.    [provider]  QUEtiapine (SEROQUEL) 50 MG tablet Take 50 mg by mouth daily.    [provider]  sertraline (ZOLOFT) 50 MG tablet Take 150 mg by mouth daily.    [provider]  sucralfate (CARAFATE) 1 g tablet Take 1 g by mouth 4 (four) times daily as needed (acid reflux).     [provider]    Allergies  Allergen Reactions   Adhesive [Tape] Other (See Comments)    Irritation and tears skin    Social History   Socioeconomic History   Marital status: Single    Spouse name: Not on file   Number of children: Not on file   Years of education: Not on file   Highest education level: Not on file  Occupational History   Not on file  Tobacco Use   Smoking status: Never   Smokeless tobacco: Never  Vaping Use   Vaping status: Never Used  Substance and  Sexual Activity   Alcohol use: No   Drug use: No   Sexual activity: Not on file  Other Topics Concern   Not on file  Social History Narrative   Not on file   Social Determinants of Health   Financial Resource Strain: Not on file  Food Insecurity: Not on file  Transportation Needs: Not on file  Physical Activity: Not on file  Stress: Not on file  Social Connections: Not on file  Intimate Partner Violence: Not on file    Tobacco Use: Low Risk  (02/02/2022)   Patient History    Smoking Tobacco Use: Never    Smokeless Tobacco Use: Never    Passive Exposure: Not on file   Social  History   Substance and Sexual Activity  Alcohol Use No    Family History  Problem Relation Age of Onset   Hypertension Mother     Review of Systems  Constitutional:  Negative for chills and fever.  HENT:  Negative for congestion, sore throat and tinnitus.   Eyes:  Negative for double vision, photophobia and pain.  Respiratory:  Negative for cough, shortness of breath and wheezing.   Cardiovascular:  Negative for chest pain, palpitations and orthopnea.  Gastrointestinal:  Negative for heartburn, nausea and vomiting.  Genitourinary:  Negative for dysuria, frequency and urgency.  Musculoskeletal:  Positive for joint pain.  Neurological:  Negative for dizziness, weakness and headaches.     Objective:  Physical Exam: Well nourished and well developed.  General: Alert and oriented x3, cooperative and pleasant, no acute distress.  Head: normocephalic, atraumatic, neck supple.  Eyes: EOMI.  Musculoskeletal:   Right hip can be flexed to 100 with only about 10 of internal rotation and 20 of abduction - 20 of external rotation - No trochanteric tenderness  Calves soft and nontender. Motor function intact in LE. Strength 5/5 LE bilaterally. Neuro: Distal pulses 2+. Sensation to light touch intact in LE.  Imaging Review Plain radiographs demonstrate severe degenerative joint disease of the  right hip. The bone quality appears to be adequate for age and reported activity level.  Assessment/Plan:  End stage arthritis, right hip  The patient history, physical examination, clinical judgement of the provider and imaging studies are consistent with end stage degenerative joint disease of the right hip and total hip arthroplasty is deemed medically necessary. The treatment options including medical management, injection therapy, arthroscopy and arthroplasty were discussed at length. The risks and benefits of total hip arthroplasty were presented and reviewed. The risks due to aseptic loosening, infection, stiffness, dislocation/subluxation, thromboembolic complications and other imponderables were discussed. The patient acknowledged the explanation, agreed to proceed with the plan and consent was signed. Patient is being admitted for inpatient treatment for surgery, pain control, PT, OT, prophylactic antibiotics, VTE prophylaxis, progressive ambulation and ADLs and discharge planning.The patient is planning to be discharged  home .   Patient's anticipated LOS is less than 2 midnights, meeting these requirements: - Younger than 72 - Lives within 1 hour of care - Has a competent adult at home to recover with post-op recover - NO history of  - Diabetes  - Coronary Artery Disease  - Heart failure  - Heart attack  - Stroke  - DVT/VTE  - Cardiac arrhythmia  - Respiratory Failure/COPD  - Renal failure  - Anemia  - Advanced Liver disease  Therapy Plans: HEP Disposition: Home with sister Planned DVT Prophylaxis: Aspirin 81 mg BID DME Needed: None PCP: Tiney Rouge, MD (clearance received) TXA: IV Allergies: Adhesive tape, dilaudid Anesthesia Concerns: None BMI: 41.6 Last HgbA1c: Not diabetic Pain Regimen: Hydrocodone Pharmacy: [facility])  Other: - Weight check on 12/3  - Patient was instructed on what medications to stop prior to surgery. - Follow-up visit in 2 weeks  with Dr. Lequita Halt - Begin physical therapy following surgery - Pre-operative lab work as pre-surgical testing - Prescriptions will be provided in hospital at time of discharge  Arther Abbott, PA-C Orthopedic Surgery [facility] [place]

## 2023-09-17 ENCOUNTER — Encounter (HOSPITAL_COMMUNITY): Payer: Self-pay

## 2023-09-17 NOTE — Progress Notes (Addendum)
COVID Vaccine Completed:   Yes   Date of COVID positive in last 90 days:  No  PCP - Tiney Rouge, MD at [facility], [place] Cardiologist -  Has not seen a cardiologist since 2009  [facility] clearance on chart  Chest x-ray - N/A EKG - 09-18-23 Epic Stress Test - 2009 ECHO -  N/A Cardiac Cath - 01-20-08 Epic Pacemaker/ICD device last checked: Spinal Cord Stimulator: Yes, patient to bring remote  Bowel Prep - n/a  Sleep Study - Yes, neg sleep apnea CPAP - No  Fasting Blood Sugar -  Checks Blood Sugar  - does not check   Last dose of GLP1 agonist-  N/A GLP1 instructions:  Hold 7 days before surgery    Jardiance Last dose of SGLT-2 inhibitors-  09-26-23 SGLT-2 instructions:  Hold 3 days before surgery    Blood Thinner Instructions:  Time Aspirin Instructions:  ASA 81.  Patient states she is to stay on Last Dose:  Activity level:  Can go up a flight of stairs and perform activities of daily living without stopping and without symptoms of chest pain or shortness of breath.  Some limitations due to hip pain  Able to exercise without symptoms  Unable to go up a flight of stairs without symptoms of     Anesthesia review:   Mild CAD, Hx of abnormal stress test,  HTN  A1c 7.8 on PAT labs  Patient denies shortness of breath, fever, cough and chest pain at PAT appointment  Patient verbalized understanding of instructions that were given to them at the PAT appointment. Patient was also instructed that they will need to review over the PAT instructions again at home before surgery.

## 2023-09-17 NOTE — Patient Instructions (Addendum)
SURGICAL WAITING ROOM VISITATION Patients having surgery or a procedure may have no more than 2 support people in the waiting area - these visitors may rotate.    Children under the age of 25 must have an adult with them who is not the patient.  If the patient needs to stay at the hospital during part of their recovery, the visitor guidelines for inpatient rooms apply. Pre-op nurse will coordinate an appropriate time for 1 support person to accompany patient in pre-op.  This support person may not rotate.    Please refer to the [facility] website for the visitor guidelines for Inpatients (after your surgery is over and you are in a regular room).       Your procedure is scheduled on: 09-30-23   Report to [facility] Main Entrance    Report to admitting at 9:40 AM   Call this number if you have problems the morning of surgery [phone]   Do not eat food :After Midnight.   After Midnight you may have the following liquids until 9:00 AM DAY OF SURGERY  Water Non-Citrus Juices (without pulp, NO RED-Apple, White grape, White cranberry) Black Coffee (NO MILK/CREAM OR CREAMERS, sugar ok)  Clear Tea (NO MILK/CREAM OR CREAMERS, sugar ok) regular and decaf                             Plain Jell-O (NO RED)                                           Fruit ices (not with fruit pulp, NO RED)                                     Popsicles (NO RED)                                                               Sports drinks like Gatorade (NO RED)                   The day of surgery:  Drink ONE (1) Pre-Surgery G2 by 9:00 AM the morning of surgery. Drink in one sitting. Do not sip.  This drink was given to you during your hospital  pre-op appointment visit. Nothing else to drink after completing the Pre-Surgery G2.          If you have questions, please contact your surgeon's office.   FOLLOW  ANY ADDITIONAL PRE OP INSTRUCTIONS YOU RECEIVED FROM YOUR SURGEON'S OFFICE!!!      Oral Hygiene is also important to reduce your risk of infection.                                    Remember - BRUSH YOUR TEETH THE MORNING OF SURGERY WITH YOUR REGULAR TOOTHPASTE   Do NOT smoke after Midnight   Take these medicines the morning of surgery with A SIP OF WATER:   Amlodipine  Pantoprazole  Duloxetine  Quetiapine  Gabapentin  Sertraline  Hydroxyzine  Verapamil  Omeprazole  Okay to use Flonase  Tramadol if needed  Stop all vitamins and herbal supplements 7 days before surgery  How to Manage Your Diabetes Before and After Surgery  Why is it important to control my blood sugar before and after surgery? Improving blood sugar levels before and after surgery helps healing and can limit problems. A way of improving blood sugar control is eating a healthy diet by:  Eating less sugar and carbohydrates  Increasing activity/exercise  Talking with your doctor about reaching your blood sugar goals High blood sugars (greater than 180 mg/dL) can raise your risk of infections and slow your recovery, so you will need to focus on controlling your diabetes during the weeks before surgery. Make sure that the doctor who takes care of your diabetes knows about your planned surgery including the date and location.  How do I manage my blood sugar before surgery? Check your blood sugar at least 4 times a day, starting 2 days before surgery, to make sure that the level is not too high or low. Check your blood sugar the morning of your surgery when you wake up and every 2 hours until you get to the Short Stay unit. If your blood sugar is less than 70 mg/dL, you will need to treat for low blood sugar: Do not take insulin. Treat a low blood sugar (less than 70 mg/dL) with  cup of clear juice (cranberry or apple), 4 glucose tablets, OR glucose gel. Recheck blood sugar in 15 minutes after treatment (to make sure it is greater than 70 mg/dL). If your blood sugar is not greater than 70 mg/dL on  recheck, call [phone] for further instructions. Report your blood sugar to the short stay nurse when you get to Short Stay.  If you are admitted to the hospital after surgery: Your blood sugar will be checked by the staff and you will probably be given insulin after surgery (instead of oral diabetes medicines) to make sure you have good blood sugar levels. The goal for blood sugar control after surgery is 80-180 mg/dL.   WHAT DO I DO ABOUT MY DIABETES MEDICATION?  Do not take oral diabetes medicines (pills) the morning of surgery.  Hold Jardiance 3 days before surgery (do not take after 09-26-23)  DO NOT TAKE THE FOLLOWING 7 DAYS PRIOR TO SURGERY: Ozempic, Wegovy, Rybelsus (Semaglutide), Byetta (exenatide), Bydureon (exenatide ER), Victoza, Saxenda (liraglutide), or Trulicity (dulaglutide) Mounjaro (Tirzepatide) Adlyxin (Lixisenatide), Polyethylene Glycol Loxenatide.  Reviewed and Endorsed by [facility] Patient Education Committee, August 2015                              You may not have any metal on your body including hair pins, jewelry, and body piercing             Do not wear make-up, lotions, powders, perfumes, or deodorant  Do not wear nail polish including gel and S&S, artificial/acrylic nails, or any other type of covering on natural nails including finger and toenails. If you have artificial nails, gel coating, etc. that needs to be removed by a nail salon please have this removed prior to surgery or surgery may need to be canceled/ delayed if the surgeon/ anesthesia feels like they are unable to be safely monitored.   Do not shave  48 hours prior to surgery.  Do not bring valuables to the hospital.  IS NOT RESPONSIBLE   FOR VALUABLES.   Contacts, dentures or bridgework may not be worn into surgery.   Bring small overnight bag day of surgery.   DO NOT BRING YOUR HOME MEDICATIONS TO THE HOSPITAL. PHARMACY WILL DISPENSE MEDICATIONS LISTED ON YOUR  MEDICATION LIST TO YOU DURING YOUR ADMISSION IN THE HOSPITAL!   Special Instructions: Bring a copy of your healthcare power of attorney and living will documents the day of surgery if you haven't scanned them before.              Please read over the following fact sheets you were given: IF YOU HAVE QUESTIONS ABOUT YOUR PRE-OP INSTRUCTIONS PLEASE CALL [phone] Gwen  If you received a COVID test during your pre-op visit  it is requested that you wear a mask when out in public, stay away from anyone that may not be feeling well and notify your surgeon if you develop symptoms. If you test positive for Covid or have been in contact with anyone that has tested positive in the last 10 days please notify you surgeon.    Pre-operative 5 CHG Bath Instructions   You can play a key role in reducing the risk of infection after surgery. Your skin needs to be as free of germs as possible. You can reduce the number of germs on your skin by washing with CHG (chlorhexidine gluconate) soap before surgery. CHG is an antiseptic soap that kills germs and continues to kill germs even after washing.   DO NOT use if you have an allergy to chlorhexidine/CHG or antibacterial soaps. If your skin becomes reddened or irritated, stop using the CHG and notify one of our RNs at [phone].   Please shower with the CHG soap starting 4 days before surgery using the following schedule:     Please keep in mind the following:  DO NOT shave, including legs and underarms, starting the day of your first shower.   You may shave your face at any point before/day of surgery.  Place clean sheets on your bed the day you start using CHG soap. Use a clean washcloth (not used since being washed) for each shower. DO NOT sleep with pets once you start using the CHG.   CHG Shower Instructions:  If you choose to wash your hair and private area, wash first with your normal shampoo/soap.  After you use shampoo/soap, rinse your hair  and body thoroughly to remove shampoo/soap residue.  Turn the water OFF and apply about 3 tablespoons (45 ml) of CHG soap to a CLEAN washcloth.  Apply CHG soap ONLY FROM YOUR NECK DOWN TO YOUR TOES (washing for 3-5 minutes)  DO NOT use CHG soap on face, private areas, open wounds, or sores.  Pay special attention to the area where your surgery is being performed.  If you are having back surgery, having someone wash your back for you may be helpful. Wait 2 minutes after CHG soap is applied, then you may rinse off the CHG soap.  Pat dry with a clean towel  Put on clean clothes/pajamas   If you choose to wear lotion, please use ONLY the CHG-compatible lotions on the back of this paper.     Additional instructions for the day of surgery: DO NOT APPLY any lotions, deodorants, cologne, or perfumes.   Put on clean/comfortable clothes.  Brush your teeth.  Ask your nurse before applying any prescription medications to the skin.  CHG Compatible Lotions   Aveeno Moisturizing lotion  Cetaphil Moisturizing Cream  Cetaphil Moisturizing Lotion  Clairol Herbal Essence Moisturizing Lotion, Dry Skin  Clairol Herbal Essence Moisturizing Lotion, Extra Dry Skin  Clairol Herbal Essence Moisturizing Lotion, Normal Skin  Curel Age Defying Therapeutic Moisturizing Lotion with Alpha Hydroxy  Curel Extreme Care Body Lotion  Curel Soothing Hands Moisturizing Hand Lotion  Curel Therapeutic Moisturizing Cream, Fragrance-Free  Curel Therapeutic Moisturizing Lotion, Fragrance-Free  Curel Therapeutic Moisturizing Lotion, Original Formula  Eucerin Daily Replenishing Lotion  Eucerin Dry Skin Therapy Plus Alpha Hydroxy Crme  Eucerin Dry Skin Therapy Plus Alpha Hydroxy Lotion  Eucerin Original Crme  Eucerin Original Lotion  Eucerin Plus Crme Eucerin Plus Lotion  Eucerin TriLipid Replenishing Lotion  Keri Anti-Bacterial Hand Lotion  Keri Deep Conditioning Original Lotion Dry Skin Formula Softly Scented   Keri Deep Conditioning Original Lotion, Fragrance Free Sensitive Skin Formula  Keri Lotion Fast Absorbing Fragrance Free Sensitive Skin Formula  Keri Lotion Fast Absorbing Softly Scented Dry Skin Formula  Keri Original Lotion  Keri Skin Renewal Lotion Keri Silky Smooth Lotion  Keri Silky Smooth Sensitive Skin Lotion  Nivea Body Creamy Conditioning Oil  Nivea Body Extra Enriched Lotion  Nivea Body Original Lotion  Nivea Body Sheer Moisturizing Lotion Nivea Crme  Nivea Skin Firming Lotion  NutraDerm 30 Skin Lotion  NutraDerm Skin Lotion  NutraDerm Therapeutic Skin Cream  NutraDerm Therapeutic Skin Lotion  ProShield Protective Hand Cream  Provon moisturizing lotion   PATIENT SIGNATURE_________________________________  NURSE SIGNATURE__________________________________  ________________________________________________________________________    [name]  An incentive spirometer is a tool that can help keep your lungs clear and active. This tool measures how well you are filling your lungs with each breath. Taking long deep breaths may help reverse or decrease the chance of developing breathing (pulmonary) problems (especially infection) following: A long period of time when you are unable to move or be active. BEFORE THE PROCEDURE  If the spirometer includes an indicator to show your best effort, your nurse or respiratory therapist will set it to a desired goal. If possible, sit up straight or lean slightly forward. Try not to slouch. Hold the incentive spirometer in an upright position. INSTRUCTIONS FOR USE  Sit on the edge of your bed if possible, or sit up as far as you can in bed or on a chair. Hold the incentive spirometer in an upright position. Breathe out normally. Place the mouthpiece in your mouth and seal your lips tightly around it. Breathe in slowly and as deeply as possible, raising the piston or the ball toward the top of the column. Hold your breath for  3-5 seconds or for as long as possible. Allow the piston or ball to fall to the bottom of the column. Remove the mouthpiece from your mouth and breathe out normally. Rest for a few seconds and repeat Steps 1 through 7 at least 10 times every 1-2 hours when you are awake. Take your time and take a few normal breaths between deep breaths. The spirometer may include an indicator to show your best effort. Use the indicator as a goal to work toward during each repetition. After each set of 10 deep breaths, practice coughing to be sure your lungs are clear. If you have an incision (the cut made at the time of surgery), support your incision when coughing by placing a pillow or rolled up towels firmly against it. Once you are able to get out of bed, walk around indoors and cough well.  You may stop using the incentive spirometer when instructed by your caregiver.  RISKS AND COMPLICATIONS Take your time so you do not get dizzy or light-headed. If you are in pain, you may need to take or ask for pain medication before doing incentive spirometry. It is harder to take a deep breath if you are having pain. AFTER USE Rest and breathe slowly and easily. It can be helpful to keep track of a log of your progress. Your caregiver can provide you with a simple table to help with this. If you are using the spirometer at home, follow these instructions: SEEK MEDICAL CARE IF:  You are having difficultly using the spirometer. You have trouble using the spirometer as often as instructed. Your pain medication is not giving enough relief while using the spirometer. You develop fever of 100.5 F (38.1 C) or higher. SEEK IMMEDIATE MEDICAL CARE IF:  You cough up bloody sputum that had not been present before. You develop fever of 102 F (38.9 C) or greater. You develop worsening pain at or near the incision site. MAKE SURE YOU:  Understand these instructions. Will watch your condition. Will get help right away if you  are not doing well or get worse. Document Released: 02/23/2007 Document Revised: 01/05/2012 Document Reviewed: 04/26/2007 [employer] Patient Information 2014 [employer], [place].   ________________________________________________________________________ WHAT IS A BLOOD TRANSFUSION? Blood Transfusion Information  A transfusion is the replacement of blood or some of its parts. Blood is made up of multiple cells which provide different functions. Red blood cells carry oxygen and are used for blood loss replacement. White blood cells fight against infection. Platelets control bleeding. Plasma helps clot blood. Other blood products are available for specialized needs, such as hemophilia or other clotting disorders. BEFORE THE TRANSFUSION  Who gives blood for transfusions?  Healthy volunteers who are fully evaluated to make sure their blood is safe. This is blood bank blood. Transfusion therapy is the safest it has ever been in the practice of medicine. Before blood is taken from a donor, a complete history is taken to make sure that person has no history of diseases nor engages in risky social behavior (examples are intravenous drug use or sexual activity with multiple partners). The donor's travel history is screened to minimize risk of transmitting infections, such as malaria. The donated blood is tested for signs of infectious diseases, such as HIV and hepatitis. The blood is then tested to be sure it is compatible with you in order to minimize the chance of a transfusion reaction. If you or a relative donates blood, this is often done in anticipation of surgery and is not appropriate for emergency situations. It takes many days to process the donated blood. RISKS AND COMPLICATIONS Although transfusion therapy is very safe and saves many lives, the main dangers of transfusion include:  Getting an infectious disease. Developing a transfusion reaction. This is an allergic reaction to something in the  blood you were given. Every precaution is taken to prevent this. The decision to have a blood transfusion has been considered carefully by your caregiver before blood is given. Blood is not given unless the benefits outweigh the risks. AFTER THE TRANSFUSION Right after receiving a blood transfusion, you will usually feel much better and more energetic. This is especially true if your red blood cells have gotten low (anemic). The transfusion raises the level of the red blood cells which carry oxygen, and this usually causes an energy increase. The nurse administering the transfusion will  monitor you carefully for complications. HOME CARE INSTRUCTIONS  No special instructions are needed after a transfusion. You may find your energy is better. Speak with your caregiver about any limitations on activity for underlying diseases you may have. SEEK MEDICAL CARE IF:  Your condition is not improving after your transfusion. You develop redness or irritation at the intravenous (IV) site. SEEK IMMEDIATE MEDICAL CARE IF:  Any of the following symptoms occur over the next 12 hours: Shaking chills. You have a temperature by mouth above 102 F (38.9 C), not controlled by medicine. Chest, back, or muscle pain. People around you feel you are not acting correctly or are confused. Shortness of breath or difficulty breathing. Dizziness and fainting. You get a rash or develop hives. You have a decrease in urine output. Your urine turns a dark color or changes to pink, red, or brown. Any of the following symptoms occur over the next 10 days: You have a temperature by mouth above 102 F (38.9 C), not controlled by medicine. Shortness of breath. Weakness after normal activity. The white part of the eye turns yellow (jaundice). You have a decrease in the amount of urine or are urinating less often. Your urine turns a dark color or changes to pink, red, or brown. Document Released: 10/10/2000 Document Revised:  01/05/2012 Document Reviewed: 05/29/2008 [facility] Patient Information 2014 [place], [place].  _______________________________________________________________________

## 2023-09-18 ENCOUNTER — Other Ambulatory Visit: Payer: Self-pay

## 2023-09-18 ENCOUNTER — Encounter (HOSPITAL_COMMUNITY): Payer: Self-pay

## 2023-09-18 ENCOUNTER — Encounter (HOSPITAL_COMMUNITY)
Admission: RE | Admit: 2023-09-18 | Discharge: 2023-09-18 | Disposition: A | Discharge status: Home / Self Care | Payer: No Typology Code available for payment source | Source: Ambulatory Visit | Attending: Orthopedic Surgery | Admitting: Orthopedic Surgery

## 2023-09-18 VITALS — BP 150/69 | HR 92 | Temp 98.5°F | Resp 16 | Ht 68.0 in | Wt 273.0 lb

## 2023-09-18 DIAGNOSIS — E119 Type 2 diabetes mellitus without complications: Secondary | ICD-10-CM | POA: Insufficient documentation

## 2023-09-18 DIAGNOSIS — I10 Essential (primary) hypertension: Secondary | ICD-10-CM | POA: Insufficient documentation

## 2023-09-18 DIAGNOSIS — Z01818 Encounter for other preprocedural examination: Secondary | ICD-10-CM | POA: Diagnosis present

## 2023-09-18 HISTORY — DX: Type 2 diabetes mellitus without complications: E11.9

## 2023-09-18 HISTORY — DX: Anemia, unspecified: D64.9

## 2023-09-18 LAB — CBC
HCT: 38.2 % (ref 36.0–46.0)
Hemoglobin: 12.1 g/dL (ref 12.0–15.0)
MCH: 26.8 pg (ref 26.0–34.0)
MCHC: 31.7 g/dL (ref 30.0–36.0)
MCV: 84.5 fL (ref 80.0–100.0)
Platelets: 290 10*3/uL (ref 150–400)
RBC: 4.52 MIL/uL (ref 3.87–5.11)
RDW: 14.4 % (ref 11.5–15.5)
WBC: 4.9 10*3/uL (ref 4.0–10.5)
nRBC: 0 % (ref 0.0–0.2)

## 2023-09-18 LAB — SURGICAL PCR SCREEN
MRSA, PCR: NEGATIVE
Staphylococcus aureus: NEGATIVE

## 2023-09-18 LAB — BASIC METABOLIC PANEL
Anion gap: 10 (ref 5–15)
BUN: 14 mg/dL (ref 8–23)
CO2: 24 mmol/L (ref 22–32)
Calcium: 8.3 mg/dL — ABNORMAL LOW (ref 8.9–10.3)
Chloride: 106 mmol/L (ref 98–111)
Creatinine, Ser: 0.75 mg/dL (ref 0.44–1.00)
GFR, Estimated: >60 mL/min (ref >60)
Glucose, Bld: 138 mg/dL — ABNORMAL HIGH (ref 70–99)
Potassium: 3.4 mmol/L — ABNORMAL LOW (ref 3.5–5.1)
Sodium: 140 mmol/L (ref 135–145)

## 2023-09-18 LAB — HEMOGLOBIN A1C
Hgb A1c MFr Bld: 7.8 % — ABNORMAL HIGH (ref 4.8–5.6)
Mean Plasma Glucose: 177.16 mg/dL

## 2023-09-18 LAB — GLUCOSE, CAPILLARY: Glucose-Capillary: 139 mg/dL — ABNORMAL HIGH (ref 70–99)

## 2023-09-18 LAB — TYPE AND SCREEN
ABO/RH(D): A POS
Antibody Screen: NEGATIVE

## 2023-09-29 NOTE — Anesthesia Preprocedure Evaluation (Signed)
Anesthesia Evaluation  Patient identified by MRN, date of birth, ID band Patient awake    Reviewed: Allergy & Precautions, NPO status , Patient's Chart, lab work & pertinent test results  Airway Mallampati: II  TM Distance: >3 FB Neck ROM: Full    Dental  (+) Dental Advisory Given   Pulmonary former smoker   breath sounds clear to auscultation       Cardiovascular hypertension, Pt. on medications + CAD   Rhythm:Regular Rate:Normal     Neuro/Psych S/p spinal cord stimulator    GI/Hepatic Neg liver ROS, hiatal hernia,GERD  ,,  Endo/Other  diabetes, Type 2  Class 3 obesity  Renal/GU negative Renal ROS     Musculoskeletal  (+) Arthritis ,    Abdominal   Peds  Hematology  (+) Blood dyscrasia, anemia   Anesthesia Other Findings   Reproductive/Obstetrics                             Anesthesia Physical Anesthesia Plan  ASA: 3  Anesthesia Plan: General   Post-op Pain Management: Ofirmev IV (intra-op)*   Induction: Intravenous  PONV Risk Score and Plan: 3 and Dexamethasone, Ondansetron and Midazolam  Airway Management Planned: Oral ETT  Additional Equipment: None  Intra-op Plan:   Post-operative Plan: Extubation in OR  Informed Consent: I have reviewed the patients History and Physical, chart, labs and discussed the procedure including the risks, benefits and alternatives for the proposed anesthesia with the patient or authorized representative who has indicated his/her understanding and acceptance.     Dental advisory given  Plan Discussed with: CRNA  Anesthesia Plan Comments: ( )        Anesthesia Quick Evaluation

## 2023-09-29 NOTE — Progress Notes (Signed)
DISCUSSION: [name] is a 62 yo female who presents to PAT prior to R THA on 09/30/23 with Dr. Lequita Halt. PMH of former smoking, HTN, mild CAD by cath in 2009, GERD, hiatal hernia, T2DM, anemia, arthritis, anxiety, depression, chronic back pain s/p spinal cord stimulator in 2023. Patient has had multiple uncomplicated orthopedic surgeries. She recently had an EGD on 09/11/23 at the [place].  Patient follows with PCP at the [facility] for chronic medical issues. DM is not well controlled and HgA1c is 7.8 on PAT labs. Dr. Deri Fuelling office notified and state ok to proceed. Medical clearance received by PCP that patient is cleared from medical standpoint with following instructions/note: "Please note vet has recieved multiple NSAID prescriptions for ortho providers. She is currently prescribed etodolac 400mg  TID from [place] [facility] .Please consider safety and existing medication lists when ordering. Vet also has T2DM, HTN, PTSD. Also note: Ultimately the determination of risk vs benefit for surgery and anesthesia rests with the anesthesia and surgical teams"  Patient had a remote cardiac evaluation in 2009 due to chest pain that was relieved with nitroglycerin and abnormal nuclear stress test. She underwent a LHC which showed mild CAD in the RCA and aggressive risk factor modification was recommended at that time. She has not had or needed ongoing cardiology f/u.  VS: BP (!) 150/69   Pulse 92   Temp 36.9 C (Oral)   Resp 16   Ht 5\' 8"  (1.727 m)   Wt 123.8 kg   SpO2 97%   BMI 41.51 kg/m   PROVIDERS: Horald Pollen, MD   LABS: Labs reviewed: Acceptable for surgery. (all labs ordered are listed, but only abnormal results are displayed)  Labs Reviewed  GLUCOSE, CAPILLARY - Abnormal; Notable for the following components:      Result Value   Glucose-Capillary 139 (*)    All other components within normal limits  HEMOGLOBIN A1C - Abnormal; Notable for the following components:   Hgb A1c MFr Bld 7.8 (*)     All other components within normal limits  BASIC METABOLIC PANEL - Abnormal; Notable for the following components:   Potassium 3.4 (*)    Glucose, Bld 138 (*)    Calcium 8.3 (*)    All other components within normal limits  SURGICAL PCR SCREEN  CBC  TYPE AND SCREEN     IMAGES:   EKG 09/18/23  NSR, rate 93   CV:  Left heart cath 01/20/2008:  IMPRESSION:  1. Mild coronary artery disease, most notably in the right coronary      artery with up to 50% proximal stenosis and diffuse distal      irregularities up to 30%.  2. Normal left ventricular ejection fraction with no wall motion      abnormality.    PLAN:  Aggressive risk factor modification/medical management, agreed  with statin and aspirin.  We will place on low-dose amlodipine in case  her chest pain was a vasospastic event.  We will see back in followup in  2 weeks.  Past Medical History:  Diagnosis Date   Anemia    Coronary artery disease    Mild by cath 2009   Diabetes mellitus without complication (HCC)    GERD (gastroesophageal reflux disease)    H/O hiatal hernia    History of carpal tunnel syndrome    Bilateral   History of iron deficiency anemia    History of MRSA infection    Hypertension    Impingement syndrome of left  shoulder    Knee pain    r/knee   OA (osteoarthritis)    PTSD (post-traumatic stress disorder)    Severe major depression with psychotic features [facility])     Past Surgical History:  Procedure Laterality Date   ABDOMINAL HYSTERECTOMY     BILATERAL CARPAL TUNNEL RELEASE  2006  &  2009   CARDIAC CATHETERIZATION  01-20-2008   DR Anne Fu   MILD CAD/ RCA WITH UP TO 50% PROXIMAL /  DIFFUSE DISTAL IRREGULARITIES UP TO 30%/ NORMAL LVEF   COLONOSCOPY     epidural steroid injections     JOINT REPLACEMENT     right knee and left hip   SHOULDER SURGERY     Left   SPINAL CORD STIMULATOR INSERTION N/A 01/30/2022   Procedure: PLACEMENT OF SPINAL CORD STIMULATOR;  Surgeon: Venita Lick, MD;   Location: [facility] OR;  Service: Orthopedics;  Laterality: N/A;  3 C-BED   TONSILLECTOMY AND ADENOIDECTOMY  AS CHILD   AND PALETTE REPAIR   TOTAL ABDOMINAL HYSTERECTOMY W/ BILATERAL SALPINGOOPHORECTOMY  2001   TOTAL HIP ARTHROPLASTY Left 12/19/2020   Procedure: TOTAL HIP ARTHROPLASTY ANTERIOR APPROACH;  Surgeon: Ollen Gross, MD;  Location: [facility] ORS;  Service: Orthopedics;  Laterality: Left;    TOTAL KNEE ARTHROPLASTY Right 07/25/2019   Procedure: TOTAL KNEE ARTHROPLASTY;  Surgeon: Ollen Gross, MD;  Location: [facility] ORS;  Service: Orthopedics;  Laterality: Right;    UPPER GI ENDOSCOPY      MEDICATIONS:  amLODipine (NORVASC) 10 MG tablet   aspirin EC 81 MG tablet   atorvastatin (LIPITOR) 80 MG tablet   DULoxetine (CYMBALTA) 30 MG capsule   empagliflozin (JARDIANCE) 25 MG TABS tablet   fluticasone (FLONASE) 50 MCG/ACT nasal spray   gabapentin (NEURONTIN) 300 MG capsule   gabapentin (NEURONTIN) 600 MG tablet   hydrOXYzine (ATARAX/VISTARIL) 25 MG tablet   melatonin 5 MG TABS   metFORMIN (GLUCOPHAGE-XR) 500 MG 24 hr tablet   omeprazole (PRILOSEC) 40 MG capsule   ondansetron (ZOFRAN) 4 MG tablet   pantoprazole (PROTONIX) 40 MG tablet   prazosin (MINIPRESS) 5 MG capsule   QUEtiapine (SEROQUEL) 200 MG tablet   QUEtiapine (SEROQUEL) 50 MG tablet   sertraline (ZOLOFT) 100 MG tablet   sucralfate (CARAFATE) 1 g tablet   traMADol (ULTRAM) 50 MG tablet   verapamil (CALAN-SR) 180 MG CR tablet   No current facility-administered medications for this encounter.   Marcille Blanco [facility] Short Stay/Anesthesiology [facility] Phone [phone] 09/29/2023 8:56 AM

## 2023-09-30 ENCOUNTER — Other Ambulatory Visit: Payer: Self-pay

## 2023-09-30 ENCOUNTER — Encounter (HOSPITAL_COMMUNITY): Payer: Self-pay | Admitting: Orthopedic Surgery

## 2023-09-30 ENCOUNTER — Ambulatory Visit (HOSPITAL_COMMUNITY): Payer: No Typology Code available for payment source | Admitting: Medical

## 2023-09-30 ENCOUNTER — Ambulatory Visit (HOSPITAL_COMMUNITY): Payer: No Typology Code available for payment source | Admitting: Anesthesiology

## 2023-09-30 ENCOUNTER — Observation Stay (HOSPITAL_COMMUNITY)
Admission: RE | Admit: 2023-09-30 | Discharge: 2023-10-02 | Disposition: A | Discharge status: Home / Self Care | Payer: No Typology Code available for payment source | Attending: Orthopedic Surgery | Admitting: Orthopedic Surgery

## 2023-09-30 ENCOUNTER — Encounter (HOSPITAL_COMMUNITY)
Admission: RE | Disposition: A | Discharge status: Home / Self Care | Payer: Self-pay | Source: Home / Self Care | Attending: Orthopedic Surgery

## 2023-09-30 ENCOUNTER — Ambulatory Visit (HOSPITAL_COMMUNITY): Payer: No Typology Code available for payment source

## 2023-09-30 DIAGNOSIS — I10 Essential (primary) hypertension: Secondary | ICD-10-CM | POA: Insufficient documentation

## 2023-09-30 DIAGNOSIS — M1611 Unilateral primary osteoarthritis, right hip: Secondary | ICD-10-CM | POA: Diagnosis present

## 2023-09-30 DIAGNOSIS — Z79899 Other long term (current) drug therapy: Secondary | ICD-10-CM | POA: Insufficient documentation

## 2023-09-30 DIAGNOSIS — Z96651 Presence of right artificial knee joint: Secondary | ICD-10-CM | POA: Insufficient documentation

## 2023-09-30 DIAGNOSIS — M169 Osteoarthritis of hip, unspecified: Principal | ICD-10-CM | POA: Diagnosis present

## 2023-09-30 DIAGNOSIS — I2510 Atherosclerotic heart disease of native coronary artery without angina pectoris: Secondary | ICD-10-CM | POA: Insufficient documentation

## 2023-09-30 DIAGNOSIS — E119 Type 2 diabetes mellitus without complications: Secondary | ICD-10-CM | POA: Diagnosis not present

## 2023-09-30 DIAGNOSIS — Z96642 Presence of left artificial hip joint: Secondary | ICD-10-CM | POA: Diagnosis not present

## 2023-09-30 HISTORY — PX: TOTAL HIP ARTHROPLASTY: SHX124

## 2023-09-30 LAB — GLUCOSE, CAPILLARY
Glucose-Capillary: 129 mg/dL — ABNORMAL HIGH (ref 70–99)
Glucose-Capillary: 144 mg/dL — ABNORMAL HIGH (ref 70–99)
Glucose-Capillary: 159 mg/dL — ABNORMAL HIGH (ref 70–99)
Glucose-Capillary: 200 mg/dL — ABNORMAL HIGH (ref 70–99)

## 2023-09-30 SURGERY — ARTHROPLASTY, HIP, TOTAL, ANTERIOR APPROACH
Anesthesia: General | Site: Hip | Laterality: Right

## 2023-09-30 MED ORDER — ACETAMINOPHEN 10 MG/ML IV SOLN
1000.0000 mg | Freq: Four times a day (QID) | INTRAVENOUS | Status: DC
  Administered 2023-09-30: 1000 mg via INTRAVENOUS
  Filled 2023-09-30: qty 100

## 2023-09-30 MED ORDER — ROCURONIUM BROMIDE 100 MG/10ML IV SOLN
INTRAVENOUS | Status: DC | PRN
  Administered 2023-09-30: 50 mg via INTRAVENOUS

## 2023-09-30 MED ORDER — INSULIN ASPART 100 UNIT/ML IJ SOLN: 0.0000 [IU] | Freq: Every day | INTRAMUSCULAR | Status: DC

## 2023-09-30 MED ORDER — ORAL CARE MOUTH RINSE: 15.0000 mL | Freq: Once | OROMUCOSAL | Status: AC

## 2023-09-30 MED ORDER — MIDAZOLAM HCL 5 MG/5ML IJ SOLN
INTRAMUSCULAR | Status: DC | PRN
  Administered 2023-09-30: 2 mg via INTRAVENOUS

## 2023-09-30 MED ORDER — AMISULPRIDE (ANTIEMETIC) 5 MG/2ML IV SOLN: 10.0000 mg | Freq: Once | INTRAVENOUS | Status: DC | PRN

## 2023-09-30 MED ORDER — CHLORHEXIDINE GLUCONATE 0.12 % MT SOLN
15.0000 mL | Freq: Once | OROMUCOSAL | Status: AC
  Administered 2023-09-30: 15 mL via OROMUCOSAL

## 2023-09-30 MED ORDER — PRAZOSIN HCL 5 MG PO CAPS
5.0000 mg | ORAL_CAPSULE | Freq: Every day | ORAL | Status: DC
Start: 2023-09-30 — End: 2023-10-02
  Filled 2023-09-30 (×3): qty 1

## 2023-09-30 MED ORDER — ALBUMIN HUMAN 5 % IV SOLN
INTRAVENOUS | Status: AC
  Filled 2023-09-30: qty 250

## 2023-09-30 MED ORDER — ASPIRIN 81 MG PO CHEW
81.0000 mg | CHEWABLE_TABLET | Freq: Two times a day (BID) | ORAL | Status: DC
  Administered 2023-10-01 – 2023-10-02 (×2): 81 mg via ORAL
  Filled 2023-09-30 (×4): qty 1

## 2023-09-30 MED ORDER — QUETIAPINE FUMARATE 50 MG PO TABS
200.0000 mg | ORAL_TABLET | Freq: Every day | ORAL | Status: DC
  Filled 2023-09-30 (×2): qty 4

## 2023-09-30 MED ORDER — DOCUSATE SODIUM 100 MG PO CAPS
100.0000 mg | ORAL_CAPSULE | Freq: Two times a day (BID) | ORAL | Status: DC
  Administered 2023-09-30 – 2023-10-02 (×3): 100 mg via ORAL
  Filled 2023-09-30 (×4): qty 1

## 2023-09-30 MED ORDER — ONDANSETRON HCL 4 MG/2ML IJ SOLN
INTRAMUSCULAR | Status: DC | PRN
  Administered 2023-09-30: 4 mg via INTRAVENOUS

## 2023-09-30 MED ORDER — HYDROXYZINE HCL 25 MG PO TABS: 25.0000 mg | ORAL_TABLET | Freq: Two times a day (BID) | ORAL | Status: DC | PRN

## 2023-09-30 MED ORDER — LIDOCAINE HCL (PF) 2 % IJ SOLN
INTRAMUSCULAR | Status: AC
  Filled 2023-09-30: qty 5

## 2023-09-30 MED ORDER — CEFAZOLIN SODIUM-DEXTROSE 2-4 GM/100ML-% IV SOLN
2.0000 g | Freq: Four times a day (QID) | INTRAVENOUS | Status: AC
  Administered 2023-09-30 (×2): 2 g via INTRAVENOUS
  Filled 2023-09-30 (×2): qty 100

## 2023-09-30 MED ORDER — FENTANYL CITRATE (PF) 100 MCG/2ML IJ SOLN
INTRAMUSCULAR | Status: AC
  Filled 2023-09-30: qty 2

## 2023-09-30 MED ORDER — MAGNESIUM CITRATE PO SOLN: 1.0000 | Freq: Once | ORAL | Status: DC | PRN

## 2023-09-30 MED ORDER — ACETAMINOPHEN 325 MG PO TABS: 325.0000 mg | ORAL_TABLET | Freq: Four times a day (QID) | ORAL | Status: DC | PRN

## 2023-09-30 MED ORDER — ATORVASTATIN CALCIUM 40 MG PO TABS
80.0000 mg | ORAL_TABLET | Freq: Every day | ORAL | Status: DC
Start: 2023-10-01 — End: 2023-10-02
  Filled 2023-09-30: qty 2

## 2023-09-30 MED ORDER — PROPOFOL 10 MG/ML IV BOLUS
INTRAVENOUS | Status: DC | PRN
  Administered 2023-09-30: 200 mg via INTRAVENOUS

## 2023-09-30 MED ORDER — 0.9 % SODIUM CHLORIDE (POUR BTL) OPTIME
TOPICAL | Status: DC | PRN
  Administered 2023-09-30: 1000 mL

## 2023-09-30 MED ORDER — PROPOFOL 10 MG/ML IV BOLUS
INTRAVENOUS | Status: AC
  Filled 2023-09-30: qty 20

## 2023-09-30 MED ORDER — MORPHINE SULFATE (PF) 2 MG/ML IV SOLN
0.5000 mg | INTRAVENOUS | Status: DC | PRN
  Administered 2023-09-30: 1 mg via INTRAVENOUS
  Filled 2023-09-30: qty 1

## 2023-09-30 MED ORDER — BUPIVACAINE-EPINEPHRINE 0.25% -1:200000 IJ SOLN
INTRAMUSCULAR | Status: AC
Start: 2023-09-30 — End: ?
  Filled 2023-09-30: qty 1

## 2023-09-30 MED ORDER — GABAPENTIN 300 MG PO CAPS
600.0000 mg | ORAL_CAPSULE | Freq: Two times a day (BID) | ORAL | Status: DC
  Administered 2023-10-01 – 2023-10-02 (×2): 600 mg via ORAL
  Filled 2023-09-30 (×4): qty 2

## 2023-09-30 MED ORDER — MENTHOL 3 MG MT LOZG: 1.0000 | LOZENGE | OROMUCOSAL | Status: DC | PRN

## 2023-09-30 MED ORDER — PHENYLEPHRINE 80 MCG/ML (10ML) SYRINGE FOR IV PUSH (FOR BLOOD PRESSURE SUPPORT)
PREFILLED_SYRINGE | INTRAVENOUS | Status: AC
  Filled 2023-09-30: qty 30

## 2023-09-30 MED ORDER — POLYETHYLENE GLYCOL 3350 17 G PO PACK: 17.0000 g | PACK | Freq: Every day | ORAL | Status: DC | PRN

## 2023-09-30 MED ORDER — FENTANYL CITRATE PF 50 MCG/ML IJ SOSY
PREFILLED_SYRINGE | INTRAMUSCULAR | Status: AC
  Administered 2023-09-30: 50 ug via INTRAVENOUS
  Filled 2023-09-30: qty 1

## 2023-09-30 MED ORDER — SUCRALFATE 1 G PO TABS: 1.0000 g | ORAL_TABLET | Freq: Four times a day (QID) | ORAL | Status: DC | PRN

## 2023-09-30 MED ORDER — FLUTICASONE PROPIONATE 50 MCG/ACT NA SUSP: 1.0000 | Freq: Every day | NASAL | Status: DC | PRN

## 2023-09-30 MED ORDER — MIDAZOLAM HCL 2 MG/2ML IJ SOLN
INTRAMUSCULAR | Status: AC
Start: 2023-09-30 — End: ?
  Filled 2023-09-30: qty 2

## 2023-09-30 MED ORDER — DULOXETINE HCL 30 MG PO CPEP
30.0000 mg | ORAL_CAPSULE | Freq: Two times a day (BID) | ORAL | Status: DC
  Administered 2023-10-01 – 2023-10-02 (×2): 30 mg via ORAL
  Filled 2023-09-30 (×4): qty 1

## 2023-09-30 MED ORDER — POVIDONE-IODINE 10 % EX SWAB
2.0000 | Freq: Once | CUTANEOUS | Status: AC
  Administered 2023-09-30: 2 via TOPICAL

## 2023-09-30 MED ORDER — ROCURONIUM BROMIDE 10 MG/ML (PF) SYRINGE
PREFILLED_SYRINGE | INTRAVENOUS | Status: AC
  Filled 2023-09-30: qty 10

## 2023-09-30 MED ORDER — DEXAMETHASONE SODIUM PHOSPHATE 10 MG/ML IJ SOLN
INTRAMUSCULAR | Status: AC
  Filled 2023-09-30: qty 3

## 2023-09-30 MED ORDER — DEXAMETHASONE SODIUM PHOSPHATE 10 MG/ML IJ SOLN
INTRAMUSCULAR | Status: DC | PRN
  Administered 2023-09-30: 5 mg via INTRAVENOUS

## 2023-09-30 MED ORDER — METOCLOPRAMIDE HCL 5 MG PO TABS: 5.0000 mg | ORAL_TABLET | Freq: Three times a day (TID) | ORAL | Status: DC | PRN

## 2023-09-30 MED ORDER — EMPAGLIFLOZIN 10 MG PO TABS
10.0000 mg | ORAL_TABLET | Freq: Every day | ORAL | Status: DC
  Administered 2023-10-01 – 2023-10-02 (×2): 10 mg via ORAL
  Filled 2023-09-30 (×2): qty 1

## 2023-09-30 MED ORDER — LIDOCAINE HCL (CARDIAC) PF 100 MG/5ML IV SOSY
PREFILLED_SYRINGE | INTRAVENOUS | Status: DC | PRN
  Administered 2023-09-30: 50 mg via INTRAVENOUS

## 2023-09-30 MED ORDER — FENTANYL CITRATE (PF) 100 MCG/2ML IJ SOLN
INTRAMUSCULAR | Status: DC | PRN
  Administered 2023-09-30 (×3): 50 ug via INTRAVENOUS

## 2023-09-30 MED ORDER — INSULIN ASPART 100 UNIT/ML IJ SOLN
0.0000 [IU] | Freq: Three times a day (TID) | INTRAMUSCULAR | Status: DC
  Administered 2023-10-01: 3 [IU] via SUBCUTANEOUS
  Administered 2023-10-01: 5 [IU] via SUBCUTANEOUS
  Administered 2023-10-02 (×2): 3 [IU] via SUBCUTANEOUS

## 2023-09-30 MED ORDER — PHENOL 1.4 % MT LIQD: 1.0000 | OROMUCOSAL | Status: DC | PRN

## 2023-09-30 MED ORDER — METOCLOPRAMIDE HCL 5 MG/ML IJ SOLN: 5.0000 mg | Freq: Three times a day (TID) | INTRAMUSCULAR | Status: DC | PRN

## 2023-09-30 MED ORDER — SUGAMMADEX SODIUM 200 MG/2ML IV SOLN
INTRAVENOUS | Status: DC | PRN
  Administered 2023-09-30: 100 mg via INTRAVENOUS
  Administered 2023-09-30: 200 mg via INTRAVENOUS

## 2023-09-30 MED ORDER — WATER FOR IRRIGATION, STERILE IR SOLN
Status: DC | PRN
  Administered 2023-09-30: 1000 mL

## 2023-09-30 MED ORDER — ALBUMIN HUMAN 5 % IV SOLN: INTRAVENOUS | Status: DC | PRN

## 2023-09-30 MED ORDER — SERTRALINE HCL 100 MG PO TABS
100.0000 mg | ORAL_TABLET | Freq: Every day | ORAL | Status: DC
  Administered 2023-10-01 – 2023-10-02 (×2): 100 mg via ORAL
  Filled 2023-09-30 (×2): qty 1

## 2023-09-30 MED ORDER — METHOCARBAMOL 1000 MG/10ML IJ SOLN: 500.0000 mg | Freq: Four times a day (QID) | INTRAMUSCULAR | Status: DC | PRN

## 2023-09-30 MED ORDER — TRAMADOL HCL 50 MG PO TABS
50.0000 mg | ORAL_TABLET | Freq: Three times a day (TID) | ORAL | Status: DC | PRN
  Administered 2023-10-01 (×2): 50 mg via ORAL
  Filled 2023-09-30 (×2): qty 1

## 2023-09-30 MED ORDER — FENTANYL CITRATE PF 50 MCG/ML IJ SOSY: 25.0000 ug | PREFILLED_SYRINGE | INTRAMUSCULAR | Status: DC | PRN

## 2023-09-30 MED ORDER — METHOCARBAMOL 500 MG PO TABS
500.0000 mg | ORAL_TABLET | Freq: Four times a day (QID) | ORAL | Status: DC | PRN
  Administered 2023-09-30 – 2023-10-02 (×4): 500 mg via ORAL
  Filled 2023-09-30 (×4): qty 1

## 2023-09-30 MED ORDER — ONDANSETRON HCL 4 MG PO TABS: 4.0000 mg | ORAL_TABLET | Freq: Four times a day (QID) | ORAL | Status: DC | PRN

## 2023-09-30 MED ORDER — BISACODYL 10 MG RE SUPP: 10.0000 mg | Freq: Every day | RECTAL | Status: DC | PRN

## 2023-09-30 MED ORDER — SODIUM CHLORIDE 0.9 % IV SOLN: INTRAVENOUS | Status: DC

## 2023-09-30 MED ORDER — PANTOPRAZOLE SODIUM 40 MG PO TBEC
40.0000 mg | DELAYED_RELEASE_TABLET | Freq: Every day | ORAL | Status: DC
  Administered 2023-10-01 – 2023-10-02 (×2): 40 mg via ORAL
  Filled 2023-09-30 (×2): qty 1

## 2023-09-30 MED ORDER — BUPIVACAINE-EPINEPHRINE (PF) 0.25% -1:200000 IJ SOLN
INTRAMUSCULAR | Status: DC | PRN
  Administered 2023-09-30: 30 mL

## 2023-09-30 MED ORDER — LACTATED RINGERS IV SOLN: INTRAVENOUS | Status: DC

## 2023-09-30 MED ORDER — DEXAMETHASONE SODIUM PHOSPHATE 10 MG/ML IJ SOLN: 8.0000 mg | Freq: Once | INTRAMUSCULAR | Status: DC

## 2023-09-30 MED ORDER — TRANEXAMIC ACID-NACL 1000-0.7 MG/100ML-% IV SOLN
1000.0000 mg | INTRAVENOUS | Status: AC
  Administered 2023-09-30: 1000 mg via INTRAVENOUS
  Filled 2023-09-30: qty 100

## 2023-09-30 MED ORDER — CEFAZOLIN IN SODIUM CHLORIDE 3-0.9 GM/100ML-% IV SOLN
3.0000 g | INTRAVENOUS | Status: AC
  Administered 2023-09-30: 3 g via INTRAVENOUS
  Filled 2023-09-30: qty 100

## 2023-09-30 MED ORDER — QUETIAPINE FUMARATE 50 MG PO TABS
50.0000 mg | ORAL_TABLET | Freq: Every day | ORAL | Status: DC
  Administered 2023-10-01 – 2023-10-02 (×2): 50 mg via ORAL
  Filled 2023-09-30 (×2): qty 1

## 2023-09-30 MED ORDER — AMLODIPINE BESYLATE 5 MG PO TABS
5.0000 mg | ORAL_TABLET | Freq: Every day | ORAL | Status: DC
  Administered 2023-10-01 – 2023-10-02 (×2): 5 mg via ORAL
  Filled 2023-09-30 (×2): qty 1

## 2023-09-30 MED ORDER — HYDROCODONE-ACETAMINOPHEN 5-325 MG PO TABS
1.0000 | ORAL_TABLET | ORAL | Status: DC | PRN
  Administered 2023-09-30 (×3): 1 via ORAL
  Administered 2023-10-01 – 2023-10-02 (×7): 2 via ORAL
  Filled 2023-09-30: qty 1
  Filled 2023-09-30 (×5): qty 2
  Filled 2023-09-30 (×2): qty 1
  Filled 2023-09-30 (×2): qty 2

## 2023-09-30 MED ORDER — PHENYLEPHRINE HCL (PRESSORS) 10 MG/ML IV SOLN
INTRAVENOUS | Status: DC | PRN
  Administered 2023-09-30 (×2): 80 ug via INTRAVENOUS

## 2023-09-30 MED ORDER — ONDANSETRON HCL 4 MG/2ML IJ SOLN: 4.0000 mg | Freq: Four times a day (QID) | INTRAMUSCULAR | Status: DC | PRN

## 2023-09-30 MED ORDER — ONDANSETRON HCL 4 MG/2ML IJ SOLN
INTRAMUSCULAR | Status: AC
  Filled 2023-09-30: qty 4

## 2023-09-30 MED ORDER — INSULIN ASPART 100 UNIT/ML IJ SOLN: 0.0000 [IU] | INTRAMUSCULAR | Status: DC | PRN

## 2023-09-30 SURGICAL SUPPLY — 36 items
BAG COUNTER SPONGE SURGICOUNT (BAG) IMPLANT
BAG ZIPLOCK 12X15 (MISCELLANEOUS) IMPLANT
BLADE SAG 18X100X1.27 (BLADE) ×2 IMPLANT
COVER PERINEAL POST (MISCELLANEOUS) ×2 IMPLANT
COVER SURGICAL LIGHT HANDLE (MISCELLANEOUS) ×2 IMPLANT
CUP ACET PINNACLE SECTR 50MM (Hips) IMPLANT
DERMABOND ADVANCED .7 DNX12 (GAUZE/BANDAGES/DRESSINGS) ×2 IMPLANT
DRAPE FOOT SWITCH (DRAPES) ×2 IMPLANT
DRAPE STERI IOBAN 125X83 (DRAPES) ×2 IMPLANT
DRAPE U-SHAPE 47X51 STRL (DRAPES) ×4 IMPLANT
DRSG AQUACEL AG ADV 3.5X10 (GAUZE/BANDAGES/DRESSINGS) ×2 IMPLANT
DURAPREP 26ML APPLICATOR (WOUND CARE) ×2 IMPLANT
ELECT REM PT RETURN 15FT ADLT (MISCELLANEOUS) ×2 IMPLANT
GLOVE BIO SURGEON STRL SZ 6.5 (GLOVE) IMPLANT
GLOVE BIO SURGEON STRL SZ8 (GLOVE) ×2 IMPLANT
GLOVE BIOGEL PI IND STRL 6.5 (GLOVE) IMPLANT
GLOVE BIOGEL PI IND STRL 7.0 (GLOVE) IMPLANT
GLOVE BIOGEL PI IND STRL 8 (GLOVE) ×2 IMPLANT
GOWN STRL REUS W/ TWL LRG LVL3 (GOWN DISPOSABLE) ×2 IMPLANT
HEAD FEMORAL 32 CERAMIC (Hips) IMPLANT
HOLDER FOLEY CATH W/STRAP (MISCELLANEOUS) ×2 IMPLANT
KIT TURNOVER KIT A (KITS) IMPLANT
LINER MARATHON 32 50 (Hips) IMPLANT
MANIFOLD NEPTUNE II (INSTRUMENTS) ×2 IMPLANT
PACK ANTERIOR HIP CUSTOM (KITS) ×2 IMPLANT
PENCIL SMOKE EVACUATOR COATED (MISCELLANEOUS) ×2 IMPLANT
PINNACLE SECTOR CUP 50MM (Hips) ×1 IMPLANT
SPIKE FLUID TRANSFER (MISCELLANEOUS) ×2 IMPLANT
STEM FEM ACTIS STD SZ4 (Stem) IMPLANT
SUT ETHIBOND NAB CT1 #1 30IN (SUTURE) ×2 IMPLANT
SUT MNCRL AB 4-0 PS2 18 (SUTURE) ×2 IMPLANT
SUT STRATAFIX 0 PDS 27 VIOLET (SUTURE) ×2
SUT VIC AB 2-0 CT1 TAPERPNT 27 (SUTURE) ×4 IMPLANT
SUTURE STRATFX 0 PDS 27 VIOLET (SUTURE) ×2 IMPLANT
TRAY FOLEY MTR SLVR 16FR STAT (SET/KITS/TRAYS/PACK) ×2 IMPLANT
TUBE SUCTION HIGH CAP CLEAR NV (SUCTIONS) ×2 IMPLANT

## 2023-09-30 NOTE — Transfer of Care (Signed)
Immediate Anesthesia Transfer of Care Note  Patient: [name]  Procedure(s) Performed: TOTAL HIP ARTHROPLASTY ANTERIOR APPROACH (Right: Hip)  Patient Location: PACU  Anesthesia Type:General  Level of Consciousness: drowsy  Airway & Oxygen Therapy: Patient Spontanous Breathing and Patient connected to face mask oxygen  Post-op Assessment: Report given to RN and Post -op Vital signs reviewed and stable  Post vital signs: Reviewed and stable  Last Vitals:  Vitals Value Taken Time  BP 103/43 09/30/23 1402  Temp    Pulse 95 09/30/23 1404  Resp 24 09/30/23 1404  SpO2 92 % 09/30/23 1404  Vitals shown include unfiled device data.  Last Pain:  Vitals:   09/30/23 1039  TempSrc:   PainSc: 8          Complications: No notable events documented.

## 2023-09-30 NOTE — Plan of Care (Signed)

## 2023-09-30 NOTE — Evaluation (Signed)
Physical Therapy Evaluation Patient Details Name: [name] MRN: [id number] DOB: [birth year] Today's Date: 09/30/2023  History of Present Illness  62 yo female presents to therapy s/p R THA, anterior approach on 09/30/2023 due to failure of conservative measures. Pt PMH includes but is not limited to: OA, HLD, depression, HTN, GERD, L shoulder impingement, CAD, PTSD, L THA, AA (2022) and R TKA (2020).  Clinical Impression   [name] is a 62 y.o. female POD 0 s/p R THA, AA. Patient reports mod I for gait with SPC in home, required some assist with shower transfers and step navigation as well as ADLs and IADLs at baseline. Patient is now limited by functional impairments (see PT problem list below) and requires min A for bed mobility and CGA for transfers. Patient was able to ambulate 35 feet with RW and CGA level of assist. Patient instructed in exercise to facilitate ROM and circulation to manage edema.  Patient will benefit from continued skilled PT interventions to address impairments and progress towards PLOF. Acute PT will follow to progress mobility and stair training in preparation for safe discharge home with family support and HEP.         If plan is discharge home, recommend the following: A little help with walking and/or transfers;A little help with bathing/dressing/bathroom;Assistance with cooking/housework;Assist for transportation;Help with stairs or ramp for entrance   Can travel by private vehicle        Equipment Recommendations None recommended by PT  Recommendations for Other Services       Functional Status Assessment Patient has had a recent decline in their functional status and demonstrates the ability to make significant improvements in function in a reasonable and predictable amount of time.     Precautions / Restrictions Precautions Precautions: Fall Restrictions Weight Bearing Restrictions: No      Mobility  Bed Mobility Overal bed mobility: Needs  Assistance Bed Mobility: Supine to Sit     Supine to sit: Min assist     General bed mobility comments: min A to initiate R LE to EOB, when PT arrived pt was in L side lying with pillow between knees and sleeping    Transfers Overall transfer level: Needs assistance Equipment used: Rolling walker (2 wheels) Transfers: Sit to/from Stand Sit to Stand: Contact guard assist           General transfer comment: min cues    Ambulation/Gait Ambulation/Gait assistance: Contact guard assist Gait Distance (Feet): 35 Feet Assistive device: Rolling walker (2 wheels) Gait Pattern/deviations: Step-to pattern, Antalgic Gait velocity: decreased     General Gait Details: min cues for safety with turns and proper posture step to pattern  Stairs            Wheelchair Mobility     Tilt Bed    Modified Rankin (Stroke Patients Only)       Balance Overall balance assessment: Needs assistance, History of Falls Sitting-balance support: Feet supported Sitting balance-Leahy Scale: Good     Standing balance support: Bilateral upper extremity supported, During functional activity, Reliant on assistive device for balance Standing balance-Leahy Scale: Poor                               Pertinent Vitals/Pain Pain Assessment Pain Assessment: 0-10 Pain Score: 8  Pain Location: R hip Pain Descriptors / Indicators: Aching, Discomfort, Grimacing, Operative site guarding Pain Intervention(s): Limited activity within patient's tolerance, Monitored during  session, Premedicated before session, Repositioned, Ice applied, RN gave pain meds during session, Patient requesting pain meds-RN notified    Home Living Family/patient expects to be discharged to:: Private residence Living Arrangements: Other relatives (sister lives with pt and is able to proivde 24/7 supervision and assist) Available Help at Discharge: Family Type of Home: House Home Access: Stairs to enter Entrance  Stairs-Rails: Left Entrance Stairs-Number of Steps: 2 Alternate Level Stairs-Number of Steps: 15 Home Layout: Two level;Bed/bath upstairs Home Equipment: Agricultural consultant (2 wheels);Cane - quad;Grab bars - tub/shower;Rollator (4 wheels)      Prior Function Prior Level of Function : Needs assist       Physical Assist : Mobility (physical);ADLs (physical) Mobility (physical): Gait;Stairs;Transfers ADLs (physical): Bathing;Dressing;IADLs Mobility Comments: pt used SPC in home setting and rollator  in community, pt required A for shower transfers and step navigation ADLs Comments: pt required A for IADLs, household management, driving and primarily lower body dressing     Extremity/Trunk Assessment        Lower Extremity Assessment Lower Extremity Assessment: RLE deficits/detail RLE Deficits / Details: ankle DF/PF 5/5 RLE Sensation: WNL    Cervical / Trunk Assessment Cervical / Trunk Assessment: Normal  Communication   Communication Communication: No apparent difficulties  Cognition Arousal: Alert Behavior During Therapy: WFL for tasks assessed/performed Overall Cognitive Status: Within Functional Limits for tasks assessed                                          General Comments      Exercises Total Joint Exercises Ankle Circles/Pumps: AROM, Both, 10 reps   Assessment/Plan    PT Assessment Patient needs continued PT services  PT Problem List Decreased strength;Decreased range of motion;Decreased activity tolerance;Decreased balance;Decreased mobility;Decreased coordination;Decreased cognition;Decreased knowledge of use of DME;Pain       PT Treatment Interventions DME instruction;Gait training;Stair training;Functional mobility training;Therapeutic activities;Therapeutic exercise;Balance training;Neuromuscular re-education;Patient/family education;Modalities    PT Goals (Current goals can be found in the Care Plan section)  Acute Rehab PT  Goals Patient Stated Goal: to be able to do everything PT Goal Formulation: With patient Time For Goal Achievement: 10/14/23 Potential to Achieve Goals: Good    Frequency 7X/week     Co-evaluation               AM-PAC PT "6 Clicks" Mobility  Outcome Measure Help needed turning from your back to your side while in a flat bed without using bedrails?: A Little Help needed moving from lying on your back to sitting on the side of a flat bed without using bedrails?: A Little Help needed moving to and from a bed to a chair (including a wheelchair)?: A Little Help needed standing up from a chair using your arms (e.g., wheelchair or bedside chair)?: A Little Help needed to walk in hospital room?: A Little Help needed climbing 3-5 steps with a railing? : A Lot 6 Click Score: 17    End of Session Equipment Utilized During Treatment: Gait belt Activity Tolerance: Patient tolerated treatment well;No increased pain Patient left: in chair;with call bell/phone within reach;with chair alarm set;with family/visitor present;with nursing/sitter in room Nurse Communication: Mobility status PT Visit Diagnosis: Unsteadiness on feet (R26.81);Other abnormalities of gait and mobility (R26.89);Repeated falls (R29.6);Muscle weakness (generalized) (M62.81);History of falling (Z91.81);Difficulty in walking, not elsewhere classified (R26.2);Pain Pain - Right/Left: Right Pain - part of body: Hip;Leg  Time: 1610-9604 PT Time Calculation (min) (ACUTE ONLY): 24 min   Charges:   PT Evaluation $PT Eval Low Complexity: 1 Low PT Treatments $Gait Training: 8-22 mins PT General Charges $$ ACUTE PT VISIT: 1 Visit         Johnny Bridge, PT Acute Rehab   Jacqualyn Posey 09/30/2023, 6:18 PM

## 2023-09-30 NOTE — Anesthesia Postprocedure Evaluation (Signed)
Anesthesia Post Note  Patient: SARAJANE NGUON  Procedure(s) Performed: TOTAL HIP ARTHROPLASTY ANTERIOR APPROACH (Right: Hip)     Patient location during evaluation: PACU Anesthesia Type: General Level of consciousness: awake and alert Pain management: pain level controlled Vital Signs Assessment: post-procedure vital signs reviewed and stable Respiratory status: spontaneous breathing, nonlabored ventilation, respiratory function stable and patient connected to nasal cannula oxygen Cardiovascular status: blood pressure returned to baseline and stable Postop Assessment: no apparent nausea or vomiting Anesthetic complications: no  No notable events documented.  Last Vitals:  Vitals:   09/30/23 1603 09/30/23 1735  BP: (!) 134/44 135/68  Pulse: 77 79  Resp: 16 17  Temp:  36.7 C  SpO2:  94%    Last Pain:  Vitals:   09/30/23 1735  TempSrc: Oral  PainSc:                  Kennieth Rad

## 2023-09-30 NOTE — Interval H&P Note (Signed)
History and Physical Interval Note:  09/30/2023 9:54 AM  [name]  has presented today for surgery, with the diagnosis of right hip osteoarthritis.  The various methods of treatment have been discussed with the patient and family. After consideration of risks, benefits and other options for treatment, the patient has consented to  Procedure(s): TOTAL HIP ARTHROPLASTY ANTERIOR APPROACH (Right) as a surgical intervention.  The patient's history has been reviewed, patient examined, no change in status, stable for surgery.  I have reviewed the patient's chart and labs.  Questions were answered to the patient's satisfaction.     [name]

## 2023-09-30 NOTE — Anesthesia Procedure Notes (Signed)
Procedure Name: Intubation Date/Time: 09/30/2023 11:52 AM  Performed by: Nathen May, CRNAPre-anesthesia Checklist: Patient identified, Emergency Drugs available, Suction available and Patient being monitored Patient Re-evaluated:Patient Re-evaluated prior to induction Oxygen Delivery Method: Circle System Utilized Preoxygenation: Pre-oxygenation with 100% oxygen Induction Type: IV induction Ventilation: Mask ventilation without difficulty Laryngoscope Size: Mac and 3 Grade View: Grade I Tube type: Oral Tube size: 7.5 mm Number of attempts: 1 Airway Equipment and Method: Stylet and Oral airway Placement Confirmation: ETT inserted through vocal cords under direct vision, positive ETCO2 and breath sounds checked- equal and bilateral Secured at: 22 cm Tube secured with: Tape Dental Injury: Teeth and Oropharynx as per pre-operative assessment

## 2023-09-30 NOTE — Discharge Instructions (Signed)
Kari Gross, MD Total Joint Specialist [facility] [address], [address] [place], [place] [address] [phone]  ANTERIOR APPROACH TOTAL HIP REPLACEMENT POSTOPERATIVE DIRECTIONS     Hip Rehabilitation, Guidelines Following Surgery  The results of a hip operation are greatly improved after range of motion and muscle strengthening exercises. Follow all safety measures which are given to protect your hip. If any of these exercises cause increased pain or swelling in your joint, decrease the amount until you are comfortable again. Then slowly increase the exercises. Call your caregiver if you have problems or questions.   BLOOD CLOT PREVENTION Take an 81 mg Aspirin two times a day for three weeks following surgery. Then resume one 81 mg Aspirin once a day. You may resume your vitamins/supplements upon discharge from the hospital. Do not take any NSAIDs (Advil, Aleve, Ibuprofen, Meloxicam, etc.) until you are 3 weeks out from surgery  HOME CARE INSTRUCTIONS  Remove items at home which could result in a fall. This includes throw rugs or furniture in walking pathways.  ICE to the affected hip as frequently as 20-30 minutes an hour and then as needed for pain and swelling. Continue to use ice on the hip for pain and swelling from surgery. You may notice swelling that will progress down to the foot and ankle. This is normal after surgery. Elevate the leg when you are not up walking on it.   Continue to use the breathing machine which will help keep your temperature down.  It is common for your temperature to cycle up and down following surgery, especially at night when you are not up moving around and exerting yourself.  The breathing machine keeps your lungs expanded and your temperature down.  DIET You may resume your previous home diet once your are discharged from the hospital.  DRESSING / WOUND CARE / SHOWERING You have an adhesive waterproof bandage over the  incision. Leave this in place until your first follow-up appointment. Once you remove this you will not need to place another bandage.  You may begin showering 3 days following surgery, but do not submerge the incision under water.  ACTIVITY For the first 3-5 days, it is important to rest and keep the operative leg elevated. You should, as a general rule, rest for 50 minutes and walk/stretch for 10 minutes per hour. After 5 days, you may slowly increase activity as tolerated.  Perform the exercises you were provided twice a day for about 15-20 minutes each session. Begin these 2 days following surgery. Walk with your walker as instructed. Use the walker until you are comfortable transitioning to a cane. Walk with the cane in the opposite hand of the operative leg. You may discontinue the cane once you are comfortable and walking steadily. Avoid periods of inactivity such as sitting longer than an hour when not asleep. This helps prevent blood clots.  Do not drive a car for 6 weeks or until released by your surgeon.  Do not drive while taking narcotics.  TED HOSE STOCKINGS Wear the elastic stockings on both legs for three weeks following surgery during the day. You may remove them at night while sleeping.  WEIGHT BEARING Weight bearing as tolerated with assist device (walker, cane, etc) as directed, use it as long as suggested by your surgeon or therapist, typically at least 4-6 weeks.  POSTOPERATIVE CONSTIPATION PROTOCOL Constipation - defined medically as fewer than three stools per week and severe constipation as less than one stool per week.  less than one stool per week.  One of the most common issues patients have following surgery is constipation.  Even if you have a regular bowel pattern at home, your normal regimen is likely to be disrupted due to multiple reasons following surgery.  Combination of anesthesia, postoperative narcotics, change in appetite and fluid intake all can  affect your bowels.  In order to avoid complications following surgery, here are some recommendations in order to help you during your recovery period.  Colace (docusate) - Pick up an over-the-counter form of Colace or another stool softener and take twice a day as long as you are requiring postoperative pain medications.  Take with a full glass of water daily.  If you experience loose stools or diarrhea, hold the colace until you stool forms back up.  If your symptoms do not get better within 1 week or if they get worse, check with your doctor. Dulcolax (bisacodyl) - Pick up over-the-counter and take as directed by the product packaging as needed to assist with the movement of your bowels.  Take with a full glass of water.  Use this product as needed if not relieved by Colace only.  MiraLax (polyethylene glycol) - Pick up over-the-counter to have on hand.  MiraLax is a solution that will increase the amount of water in your bowels to assist with bowel movements.  Take as directed and can mix with a glass of water, juice, soda, coffee, or tea.  Take if you go more than two days without a movement.Do not use MiraLax more than once per day. Call your doctor if you are still constipated or irregular after using this medication for 7 days in a row.  If you continue to have problems with postoperative constipation, please contact the office for further assistance and recommendations.  If you experience "the worst abdominal pain ever" or develop nausea or vomiting, please contact the office immediatly for further recommendations for treatment.  ITCHING  If you experience itching with your medications, try taking only a single pain pill, or even half a pain pill at a time.  You can also use Benadryl over the counter for itching or also to help with sleep.   MEDICATIONS See your medication summary on the "After Visit Summary" that the nursing staff will review with you prior to discharge.  You may have some home  medications which will be placed on hold until you complete the course of blood thinner medication.  It is important for you to complete the blood thinner medication as prescribed by your surgeon.  Continue your approved medications as instructed at time of discharge.  PRECAUTIONS If you experience chest pain or shortness of breath - call 911 immediately for transfer to the hospital emergency department.  If you develop a fever greater that 101 F, purulent drainage from wound, increased redness or drainage from wound, foul odor from the wound/dressing, or calf pain - CONTACT YOUR SURGEON.                                                   FOLLOW-UP APPOINTMENTS Make sure you keep all of your appointments after your operation with your surgeon and caregivers. You should call the office at the above phone number and make an appointment for approximately two weeks after the date of your surgery or on the   date instructed by your surgeon outlined in the "After Visit Summary".  RANGE OF MOTION AND STRENGTHENING EXERCISES  These exercises are designed to help you keep full movement of your hip joint. Follow your caregiver's or physical therapist's instructions. Perform all exercises about fifteen times, three times per day or as directed. Exercise both hips, even if you have had only one joint replacement. These exercises can be done on a training (exercise) mat, on the floor, on a table or on a bed. Use whatever works the best and is most comfortable for you. Use music or television while you are exercising so that the exercises are a pleasant break in your day. This will make your life better with the exercises acting as a break in routine you can look forward to.  Lying on your back, slowly slide your foot toward your buttocks, raising your knee up off the floor. Then slowly slide your foot back down until your leg is straight again.  Lying on your back spread your legs as far apart as you can without causing  discomfort.  Lying on your side, raise your upper leg and foot straight up from the floor as far as is comfortable. Slowly lower the leg and repeat.  Lying on your back, tighten up the muscle in the front of your thigh (quadriceps muscles). You can do this by keeping your leg straight and trying to raise your heel off the floor. This helps strengthen the largest muscle supporting your knee.  Lying on your back, tighten up the muscles of your buttocks both with the legs straight and with the knee bent at a comfortable angle while keeping your heel on the floor.   POST-OPERATIVE OPIOID TAPER INSTRUCTIONS: It is important to wean off of your opioid medication as soon as possible. If you do not need pain medication after your surgery it is ok to stop day one. Opioids include: Codeine, Hydrocodone(Norco, Vicodin), Oxycodone(Percocet, oxycontin) and hydromorphone amongst others.  Long term and even short term use of opiods can cause: Increased pain response Dependence Constipation Depression Respiratory depression And more.  Withdrawal symptoms can include Flu like symptoms Nausea, vomiting And more Techniques to manage these symptoms Hydrate well Eat regular healthy meals Stay active Use relaxation techniques(deep breathing, meditating, yoga) Do Not substitute Alcohol to help with tapering If you have been on opioids for less than two weeks and do not have pain than it is ok to stop all together.  Plan to wean off of opioids This plan should start within one week post op of your joint replacement. Maintain the same interval or time between taking each dose and first decrease the dose.  Cut the total daily intake of opioids by one tablet each day Next start to increase the time between doses. The last dose that should be eliminated is the evening dose.   IF YOU ARE TRANSFERRED TO A SKILLED REHAB FACILITY If the patient is transferred to a skilled rehab facility following release from the  hospital, a list of the current medications will be sent to the facility for the patient to continue.  When discharged from the skilled rehab facility, please have the facility set up the patient's Home Health Physical Therapy prior to being released. Also, the skilled facility will be responsible for providing the patient with their medications at time of release from the facility to include their pain medication, the muscle relaxants, and their blood thinner medication. If the patient is still at the rehab facility   up appointment, the skilled rehab facility will also need to assist the patient in arranging follow up appointment in our office and any transportation needs.  MAKE SURE YOU:  Understand these instructions.  Get help right away if you are not doing well or get worse.    DENTAL ANTIBIOTICS:  In most cases prophylactic antibiotics for Dental procdeures after total joint surgery are not necessary.  Exceptions are as follows:  1. History of prior total joint infection  2. Severely immunocompromised (Organ Transplant, cancer chemotherapy, Rheumatoid biologic meds such as Humera)  3. Poorly controlled diabetes (A1C &gt; 8.0, blood glucose over 200)  If you have one of these conditions, contact your surgeon for an antibiotic prescription, prior to your dental procedure.    Pick up stool softner and laxative for home use following surgery while on pain medications. Do not submerge incision under water. Please use good hand washing techniques while changing dressing each day. May shower starting three days after surgery. Please use a clean towel to pat the incision dry following showers. Continue to use ice for pain and swelling after surgery. Do not use any lotions or creams on the incision until instructed by your surgeon.  

## 2023-09-30 NOTE — Op Note (Signed)
OPERATIVE REPORT- TOTAL HIP ARTHROPLASTY   PREOPERATIVE DIAGNOSIS: Osteoarthritis of the Right hip.   POSTOPERATIVE DIAGNOSIS: Osteoarthritis of the Right  hip.   PROCEDURE: Right total hip arthroplasty, anterior approach.   SURGEON: Ollen Gross, MD   ASSISTANT: Arther Abbott, PA-C  ANESTHESIA:  General  ESTIMATED BLOOD LOSS:-500 mL    DRAINS: None  COMPLICATIONS: None   CONDITION: PACU - hemodynamically stable.   BRIEF CLINICAL NOTE: [name] is a 62 y.o. female who has advanced end-  stage arthritis of their Right  hip with progressively worsening pain and  dysfunction.The patient has failed nonoperative management and presents for  total hip arthroplasty.   PROCEDURE IN DETAIL: After successful administration of spinal  anesthetic, the traction boots for the [facility] bed were placed on both  feet and the patient was placed onto the [facility] bed, boots placed into the leg  holders. The Right hip was then isolated from the perineum with plastic  drapes and prepped and draped in the usual sterile fashion. ASIS and  greater trochanter were marked and a oblique incision was made, starting  at about 1 cm lateral and 2 cm distal to the ASIS and coursing towards  the anterior cortex of the femur. The skin was cut with a 10 blade  through subcutaneous tissue to the level of the fascia overlying the  tensor fascia lata muscle. The fascia was then incised in line with the  incision at the junction of the anterior third and posterior 2/3rd. The  muscle was teased off the fascia and then the interval between the TFL  and the rectus was developed. The Hohmann retractor was then placed at  the top of the femoral neck over the capsule. The vessels overlying the  capsule were cauterized and the fat on top of the capsule was removed.  A Hohmann retractor was then placed anterior underneath the rectus  femoris to give exposure to the entire anterior capsule. A T-shaped   capsulotomy was performed. The edges were tagged and the femoral head  was identified.       Osteophytes are removed off the superior acetabulum.  The femoral neck was then cut in situ with an oscillating saw. Traction  was then applied to the left lower extremity utilizing the [facility]  traction. The femoral head was then removed. Retractors were placed  around the acetabulum and then circumferential removal of the labrum was  performed. Osteophytes were also removed. Reaming starts at 47 mm to  medialize and  Increased in 2 mm increments to 49 mm. We reamed in  approximately 40 degrees of abduction, 20 degrees anteversion. A 50 mm  pinnacle acetabular shell was then impacted in anatomic position under  fluoroscopic guidance with excellent purchase. We did not need to place  any additional dome screws. A 32 mm neutral + 4 marathon liner was then  placed into the acetabular shell.       The femoral lift was then placed along the lateral aspect of the femur  just distal to the vastus ridge. The leg was  externally rotated and capsule  was stripped off the inferior aspect of the femoral neck down to the  level of the lesser trochanter, this was done with electrocautery. The femur was lifted after this was performed. The  leg was then placed in an extended and adducted position essentially delivering the femur. We also removed the capsule superiorly and the piriformis from the piriformis fossa to  gain excellent exposure of the  proximal femur. Rongeur was used to remove some cancellous bone to get  into the lateral portion of the proximal femur for placement of the  initial starter reamer. The starter broaches was placed  the starter broach  and was shown to go down the center of the canal. Broaching  with the Actis system was then performed starting at size 0  coursing  Up to size 4. A size 4 had excellent torsional and rotational  and axial stability. The trial standard offset neck was then  placed  with a 32 + 1 trial head. The hip was then reduced. We confirmed that  the stem was in the canal both on AP and lateral x-rays. It also has excellent sizing. The hip was reduced with outstanding stability through full extension and full external rotation.. AP pelvis was taken and the leg lengths were measured and found to be equal. Hip was then dislocated again and the femoral head and neck removed. The  femoral broach was removed. Size 4 Actis stem with a standard offset  neck was then impacted into the femur following native anteversion. Has  excellent purchase in the canal. Excellent torsional and rotational and  axial stability. It is confirmed to be in the canal on AP and lateral  fluoroscopic views. The 32 + 1  ceramic head was placed and the hip  reduced with outstanding stability. Again AP pelvis was taken and it  confirmed that the leg lengths were equal. The wound was then copiously  irrigated with saline solution and the capsule reattached and repaired  with Ethibond suture. 30 ml of .25% Bupivicaine was  injected into the capsule and into the edge of the tensor fascia lata as well as subcutaneous tissue. The fascia overlying the tensor fascia lata was then closed with a running #1 V-Loc. Subcu was closed with interrupted 2-0 Vicryl and subcuticular running 4-0 Monocryl. Incision was cleaned  and dried. Steri-Strips and a bulky sterile dressing applied. The patient was awakened and transported to  recovery in stable condition.        Please note that a surgical assistant was a medical necessity for this procedure to perform it in a safe and expeditious manner. Assistant was necessary to provide appropriate retraction of vital neurovascular structures and to prevent femoral fracture and allow for anatomic placement of the prosthesis.  Ollen Gross, M.D.

## 2023-10-01 ENCOUNTER — Other Ambulatory Visit (HOSPITAL_COMMUNITY): Payer: Self-pay

## 2023-10-01 ENCOUNTER — Encounter (HOSPITAL_COMMUNITY): Payer: Self-pay | Admitting: Orthopedic Surgery

## 2023-10-01 DIAGNOSIS — M1611 Unilateral primary osteoarthritis, right hip: Secondary | ICD-10-CM | POA: Diagnosis not present

## 2023-10-01 LAB — BASIC METABOLIC PANEL
Anion gap: 7 (ref 5–15)
BUN: 12 mg/dL (ref 8–23)
CO2: 27 mmol/L (ref 22–32)
Calcium: 8.3 mg/dL — ABNORMAL LOW (ref 8.9–10.3)
Chloride: 106 mmol/L (ref 98–111)
Creatinine, Ser: 0.88 mg/dL (ref 0.44–1.00)
GFR, Estimated: >60 mL/min (ref >60)
Glucose, Bld: 190 mg/dL — ABNORMAL HIGH (ref 70–99)
Potassium: 4.5 mmol/L (ref 3.5–5.1)
Sodium: 140 mmol/L (ref 135–145)

## 2023-10-01 LAB — CBC
HCT: 33 % — ABNORMAL LOW (ref 36.0–46.0)
Hemoglobin: 10.4 g/dL — ABNORMAL LOW (ref 12.0–15.0)
MCH: 27.1 pg (ref 26.0–34.0)
MCHC: 31.5 g/dL (ref 30.0–36.0)
MCV: 85.9 fL (ref 80.0–100.0)
Platelets: 219 10*3/uL (ref 150–400)
RBC: 3.84 MIL/uL — ABNORMAL LOW (ref 3.87–5.11)
RDW: 14.9 % (ref 11.5–15.5)
WBC: 10.5 10*3/uL (ref 4.0–10.5)
nRBC: 0 % (ref 0.0–0.2)

## 2023-10-01 LAB — GLUCOSE, CAPILLARY
Glucose-Capillary: 144 mg/dL — ABNORMAL HIGH (ref 70–99)
Glucose-Capillary: 164 mg/dL — ABNORMAL HIGH (ref 70–99)
Glucose-Capillary: 227 mg/dL — ABNORMAL HIGH (ref 70–99)
Glucose-Capillary: 152 mg/dL — ABNORMAL HIGH (ref 70–99)

## 2023-10-01 MED ORDER — HYDROCODONE-ACETAMINOPHEN 5-325 MG PO TABS: 1.0000 | ORAL_TABLET | Freq: Four times a day (QID) | ORAL | 0 refills | Status: AC | PRN

## 2023-10-01 MED ORDER — METHOCARBAMOL 500 MG PO TABS: 500.0000 mg | ORAL_TABLET | Freq: Four times a day (QID) | ORAL | 0 refills | Status: AC | PRN

## 2023-10-01 MED ORDER — ASPIRIN 81 MG PO CHEW: 81.0000 mg | CHEWABLE_TABLET | Freq: Two times a day (BID) | ORAL | 0 refills | Status: AC

## 2023-10-01 MED ORDER — ONDANSETRON HCL 4 MG PO TABS: 4.0000 mg | ORAL_TABLET | Freq: Four times a day (QID) | ORAL | 0 refills | Status: AC | PRN

## 2023-10-01 MED ORDER — TRAMADOL HCL 50 MG PO TABS: 50.0000 mg | ORAL_TABLET | Freq: Three times a day (TID) | ORAL | 0 refills | Status: AC | PRN

## 2023-10-01 NOTE — Progress Notes (Signed)
Physical Therapy Treatment Patient Details Name: [name] MRN: [id number] DOB: [birth year] Today's Date: 10/01/2023   History of Present Illness 62 yo female presents to therapy s/p R THA, anterior approach on 09/30/2023 due to failure of conservative measures. Pt PMH includes but is not limited to: OA, HLD, depression, HTN, GERD, L shoulder impingement, CAD, PTSD, L THA, AA (2022) and R TKA (2020).    PT Comments  Pt ambulated 30' with RW, distance limited by pain and lightheadedness. Pt was assisted to recliner where BP was 164/78, HR 79, SpO2 95% on room air.      If plan is discharge home, recommend the following: A little help with walking and/or transfers;A little help with bathing/dressing/bathroom;Assistance with cooking/housework;Assist for transportation;Help with stairs or ramp for entrance   Can travel by private vehicle        Equipment Recommendations  None recommended by PT    Recommendations for Other Services       Precautions / Restrictions Precautions Precautions: Fall Restrictions Weight Bearing Restrictions: No     Mobility  Bed Mobility Overal bed mobility: Needs Assistance Bed Mobility: Supine to Sit     Supine to sit: Mod assist, HOB elevated, Used rails     General bed mobility comments: assist to raise trunk, pivot hips, and support RLE    Transfers Overall transfer level: Needs assistance Equipment used: Rolling walker (2 wheels) Transfers: Sit to/from Stand Sit to Stand: Min assist, From elevated surface           General transfer comment: VCs hand placement    Ambulation/Gait Ambulation/Gait assistance: Contact guard assist Gait Distance (Feet): 30 Feet Assistive device: Rolling walker (2 wheels) Gait Pattern/deviations: Step-to pattern, Decreased step length - right, Decreased step length - left Gait velocity: decreased     General Gait Details: distance limited by pain and lightheadedness, assisted pt to recliner where BP  was 164/78, HR 79, SpO2 95% on room air   Stairs             Wheelchair Mobility     Tilt Bed    Modified Rankin (Stroke Patients Only)       Balance Overall balance assessment: Needs assistance, History of Falls Sitting-balance support: Feet supported Sitting balance-Leahy Scale: Good     Standing balance support: Bilateral upper extremity supported, During functional activity, Reliant on assistive device for balance Standing balance-Leahy Scale: Poor                              Cognition Arousal: Alert Behavior During Therapy: WFL for tasks assessed/performed Overall Cognitive Status: Within Functional Limits for tasks assessed                                          Exercises Total Joint Exercises Ankle Circles/Pumps: AROM, Both, 10 reps Quad Sets: AROM, Both, 5 reps, Supine Short Arc Quad: AROM, Right, 15 reps, Supine Heel Slides: AAROM, Right, 10 reps, Supine Hip ABduction/ADduction: AAROM, Right, 10 reps, Supine    General Comments        Pertinent Vitals/Pain Pain Assessment Pain Score: 8  Pain Location: R hip Pain Descriptors / Indicators: Aching, Discomfort, Grimacing, Operative site guarding, Sore Pain Intervention(s): Limited activity within patient's tolerance, Monitored during session, Premedicated before session, Ice applied, Repositioned    Home Living  Prior Function            PT Goals (current goals can now be found in the care plan section) Acute Rehab PT Goals Patient Stated Goal: to be able to exercise, walk on treadmill PT Goal Formulation: With patient Time For Goal Achievement: 10/14/23 Potential to Achieve Goals: Good Progress towards PT goals: Progressing toward goals    Frequency    7X/week      PT Plan      Co-evaluation              AM-PAC PT "6 Clicks" Mobility   Outcome Measure  Help needed turning from your back to your side  while in a flat bed without using bedrails?: A Little Help needed moving from lying on your back to sitting on the side of a flat bed without using bedrails?: A Lot Help needed moving to and from a bed to a chair (including a wheelchair)?: A Little Help needed standing up from a chair using your arms (e.g., wheelchair or bedside chair)?: A Little Help needed to walk in hospital room?: A Little Help needed climbing 3-5 steps with a railing? : A Lot 6 Click Score: 16    End of Session Equipment Utilized During Treatment: Gait belt Activity Tolerance: Patient tolerated treatment well;No increased pain Patient left: in chair;with call bell/phone within reach;with chair alarm set Nurse Communication: Mobility status PT Visit Diagnosis: Unsteadiness on feet (R26.81);Other abnormalities of gait and mobility (R26.89);Repeated falls (R29.6);Muscle weakness (generalized) (M62.81);History of falling (Z91.81);Difficulty in walking, not elsewhere classified (R26.2);Pain Pain - Right/Left: Right Pain - part of body: Hip     Time: 1022-1049 PT Time Calculation (min) (ACUTE ONLY): 27 min  Charges:    $Gait Training: 8-22 mins $Therapeutic Exercise: 8-22 mins PT General Charges $$ ACUTE PT VISIT: 1 Visit                    Tamala Ser PT 10/01/2023  Acute Rehabilitation Services  Office [phone]

## 2023-10-01 NOTE — Progress Notes (Signed)
   Subjective: 1 Day Post-Op Procedure(s) (LRB): TOTAL HIP ARTHROPLASTY ANTERIOR APPROACH (Right) Patient seen in rounds by Dr. Lequita Halt. Patient is well, and has had no acute complaints or problems. Denies SOB or chest pain. Denies calf pain. Foley cath removed this AM. Patient reports pain as moderate. Worked with physical therapy yesterday and ambulated 35'. We will continue physical therapy today.   Objective: Vital signs in last 24 hours: Temp:  [97.8 F (36.6 C)-98.6 F (37 C)] 98.3 F (36.8 C) (12/05 0544) Pulse Rate:  [68-95] 91 (12/05 0544) Resp:  [15-24] 18 (12/05 0544) BP: (99-175)/(43-80) 155/75 (12/05 0544) SpO2:  [92 %-98 %] 93 % (12/05 0544) Weight:  [123.8 kg] 123.8 kg (12/04 1603)  Intake/Output from previous day:  Intake/Output Summary (Last 24 hours) at 10/01/2023 0739 Last data filed at 10/01/2023 7253 Gross per 24 hour  Intake 3415.08 ml  Output 1000 ml  Net 2415.08 ml     Intake/Output this shift: No intake/output data recorded.  Labs: Recent Labs    10/01/23 0408  HGB 10.4*   Recent Labs    10/01/23 0408  WBC 10.5  RBC 3.84*  HCT 33.0*  PLT 219   Recent Labs    10/01/23 0408  NA 140  K 4.5  CL 106  CO2 27  BUN 12  CREATININE 0.88  GLUCOSE 190*  CALCIUM 8.3*   No results for input(s): "LABPT", "INR" in the last 72 hours.  Exam: General - Patient is Alert and Oriented Extremity - Neurologically intact Neurovascular intact Sensation intact distally Dorsiflexion/Plantar flexion intact Dressing - dressing C/D/I Motor Function - intact, moving foot and toes well on exam.  Past Medical History:  Diagnosis Date   Anemia    Coronary artery disease    Mild by cath 2009   Diabetes mellitus without complication (HCC)    GERD (gastroesophageal reflux disease)    H/O hiatal hernia    History of carpal tunnel syndrome    Bilateral   History of iron deficiency anemia    History of MRSA infection    Hypertension    Impingement  syndrome of left shoulder    Knee pain    r/knee   OA (osteoarthritis)    PTSD (post-traumatic stress disorder)    Severe major depression with psychotic features (HCC)     Assessment/Plan: 1 Day Post-Op Procedure(s) (LRB): TOTAL HIP ARTHROPLASTY ANTERIOR APPROACH (Right) Principal Problem:   OA (osteoarthritis) of hip Active Problems:   Primary osteoarthritis of right hip  Estimated body mass index is 41.51 kg/m as calculated from the following:   Height as of this encounter: 5\' 8"  (1.727 m).   Weight as of this encounter: 123.8 kg. Advance diet Up with therapy D/C IV fluids  DVT Prophylaxis - Aspirin Weight bearing as tolerated.  Continue physical therapy. Expected discharge home today pending progress with physical therapy and if meeting patient goals. Will do HEP once discharged. Follow-up in clinic in 2 weeks.  The PDMP database was reviewed today prior to any opioid medications being prescribed to this patient.  R. Arcola Jansky, PA-C Orthopedic Surgery 10/01/2023, 7:39 AM

## 2023-10-01 NOTE — TOC Transition Note (Signed)
Transition of Care [facility]) - CM/SW Discharge Note   Patient Details  Name: [name] MRN: [id number] Date of Birth: 19-Oct-1961  [facility] CM/SW Contact:  Amada Jupiter, LCSW Phone Number: 10/01/2023, 9:37 AM   Clinical Narrative:     Met with pt who confirms she has needed DME in the home.  Plan for HEP.  No TOC needs.  Final next level of care: Home/Self Care Barriers to Discharge: No Barriers Identified   Patient Goals and CMS Choice      Discharge Placement                         Discharge Plan and Services Additional resources added to the After Visit Summary for                  DME Arranged: N/A DME Agency: NA                  Social Determinants of Health (SDOH) Interventions SDOH Screenings   Food Insecurity: No Food Insecurity (09/30/2023)  Housing: Low Risk  (09/30/2023)  Transportation Needs: No Transportation Needs (09/30/2023)  Utilities: Not At Risk (09/30/2023)  Tobacco Use: Medium Risk (09/30/2023)     Readmission Risk Interventions     No data to display

## 2023-10-01 NOTE — Progress Notes (Signed)
Physical Therapy Treatment Patient Details Name: [name] MRN: [id number] DOB: 1961-10-17 Today's Date: 10/01/2023   History of Present Illness 62 yo female presents to therapy s/p R THA, anterior approach on 09/30/2023 due to failure of conservative measures. Pt PMH includes but is not limited to: OA, HLD, depression, HTN, GERD, L shoulder impingement, CAD, PTSD, L THA, AA (2022) and R TKA (2020).    PT Comments  Pt is progressing with mobility, she ambulated 63' with RW. Initiated stair training, pt ascended/descended 3 stairs with L handrail and R cane, she fatigued quite quickly on the stairs and was mildly unsteady. Pt has a flight of stairs to get to her bedroom at home, she said there is not an option to sleep on the first level. She is not yet moving well enough to safely navigate a full flight of stairs. Pt would benefit from 1 more session tomorrow morning.     If plan is discharge home, recommend the following: A little help with walking and/or transfers;A little help with bathing/dressing/bathroom;Assistance with cooking/housework;Assist for transportation;Help with stairs or ramp for entrance   Can travel by private vehicle        Equipment Recommendations  None recommended by PT    Recommendations for Other Services       Precautions / Restrictions Precautions Precautions: Fall Restrictions Weight Bearing Restrictions: No     Mobility  Bed Mobility Overal bed mobility: Needs Assistance Bed Mobility: Supine to Sit     Supine to sit: Mod assist, HOB elevated, Used rails     General bed mobility comments: assist to raise trunk, pivot hips, and support RLE    Transfers Overall transfer level: Needs assistance Equipment used: Rolling walker (2 wheels) Transfers: Sit to/from Stand Sit to Stand: From elevated surface, Contact guard assist           General transfer comment: VCs hand placement    Ambulation/Gait Ambulation/Gait assistance: Contact guard  assist Gait Distance (Feet): 90 Feet Assistive device: Rolling walker (2 wheels) Gait Pattern/deviations: Step-to pattern, Decreased step length - right, Decreased step length - left Gait velocity: decreased     General Gait Details: distance limited by fatigue, no loss of balance   Stairs Stairs: Yes Stairs assistance: Contact guard assist, Min assist Stair Management: One rail Left, With cane, Forwards, Step to pattern Number of Stairs: 3 General stair comments: VCs sequencing, pt mildly unsteady 2* fatigue but no overt loss of balance   Wheelchair Mobility     Tilt Bed    Modified Rankin (Stroke Patients Only)       Balance Overall balance assessment: Needs assistance, History of Falls Sitting-balance support: Feet supported Sitting balance-Leahy Scale: Good     Standing balance support: Bilateral upper extremity supported, During functional activity, Reliant on assistive device for balance Standing balance-Leahy Scale: Poor                              Cognition Arousal: Alert Behavior During Therapy: WFL for tasks assessed/performed Overall Cognitive Status: Within Functional Limits for tasks assessed                                          Exercises Total Joint Exercises Ankle Circles/Pumps: AROM, Both, 10 reps  Heel Slides: AAROM, Right, 10 reps, Supine     General  Comments        Pertinent Vitals/Pain Pain Assessment Pain Score: 5  Pain Location: R hip Pain Descriptors / Indicators: Sore, Grimacing Pain Intervention(s): Limited activity within patient's tolerance, Monitored during session, Premedicated before session, Ice applied    Home Living                          Prior Function            PT Goals (current goals can now be found in the care plan section) Acute Rehab PT Goals Patient Stated Goal: to be able to exercise, walk on treadmill PT Goal Formulation: With patient Time For Goal  Achievement: 10/14/23 Potential to Achieve Goals: Good Progress towards PT goals: Progressing toward goals    Frequency    7X/week      PT Plan      Co-evaluation              AM-PAC PT "6 Clicks" Mobility   Outcome Measure  Help needed turning from your back to your side while in a flat bed without using bedrails?: A Little Help needed moving from lying on your back to sitting on the side of a flat bed without using bedrails?: A Lot Help needed moving to and from a bed to a chair (including a wheelchair)?: A Little Help needed standing up from a chair using your arms (e.g., wheelchair or bedside chair)?: A Little Help needed to walk in hospital room?: A Little Help needed climbing 3-5 steps with a railing? : A Little 6 Click Score: 17    End of Session Equipment Utilized During Treatment: Gait belt Activity Tolerance: Patient tolerated treatment well Patient left: in chair;with call bell/phone within reach;with chair alarm set;with family/visitor present Nurse Communication: Mobility status PT Visit Diagnosis: Unsteadiness on feet (R26.81);Other abnormalities of gait and mobility (R26.89);Repeated falls (R29.6);Muscle weakness (generalized) (M62.81);History of falling (Z91.81);Difficulty in walking, not elsewhere classified (R26.2);Pain Pain - Right/Left: Right Pain - part of body: Hip     Time: 5956-3875 PT Time Calculation (min) (ACUTE ONLY): 31 min  Charges:    $Gait Training: 8-22 mins  $Therapeutic Activity: 8-22 mins PT General Charges $$ ACUTE PT VISIT: 1 Visit                     Tamala Ser PT 10/01/2023  Acute Rehabilitation Services  Office [phone]

## 2023-10-02 DIAGNOSIS — M1611 Unilateral primary osteoarthritis, right hip: Secondary | ICD-10-CM | POA: Diagnosis not present

## 2023-10-02 LAB — CBC
HCT: 31.6 % — ABNORMAL LOW (ref 36.0–46.0)
Hemoglobin: 10 g/dL — ABNORMAL LOW (ref 12.0–15.0)
MCH: 27.6 pg (ref 26.0–34.0)
MCHC: 31.6 g/dL (ref 30.0–36.0)
MCV: 87.3 fL (ref 80.0–100.0)
Platelets: 201 10*3/uL (ref 150–400)
RBC: 3.62 MIL/uL — ABNORMAL LOW (ref 3.87–5.11)
RDW: 15.1 % (ref 11.5–15.5)
WBC: 7.8 10*3/uL (ref 4.0–10.5)
nRBC: 0 % (ref 0.0–0.2)

## 2023-10-02 LAB — GLUCOSE, CAPILLARY
Glucose-Capillary: 192 mg/dL — ABNORMAL HIGH (ref 70–99)
Glucose-Capillary: 154 mg/dL — ABNORMAL HIGH (ref 70–99)

## 2023-10-02 NOTE — Plan of Care (Signed)

## 2023-10-02 NOTE — Progress Notes (Addendum)
Physical Therapy Treatment Patient Details Name: [name] MRN: [id number] DOB: [birth year] Today's Date: 10/02/2023   History of Present Illness 62 yo female presents to therapy s/p R THA, anterior approach on 09/30/2023 due to failure of conservative measures. Pt PMH includes but is not limited to: OA, HLD, depression, HTN, GERD, L shoulder impingement, CAD, PTSD, L THA, AA (2022) and R TKA (2020).    PT Comments  Pt is progressing with mobility, she ambulated 3' with RW, no loss of balance. In each PT session a clicking sound has been noted coming from L hip during ambulation, pt reports this has occurred since she had that hip replaced in 2022, she denies pain with it.  Reviewed stair training, pt was more steady on stairs this session. She'd like to practice stairs 1 more time this afternoon when her sister is present. Will plan to see pt for second session this afternoon. Pt has a flight of stairs to get up to her bedroom, encouraged pt to have a caregiver place a chair with arms at landing so she can rest part way as she does fatigue quickly.     If plan is discharge home, recommend the following: A little help with walking and/or transfers;A little help with bathing/dressing/bathroom;Assistance with cooking/housework;Assist for transportation;Help with stairs or ramp for entrance   Can travel by private vehicle        Equipment Recommendations  None recommended by PT    Recommendations for Other Services       Precautions / Restrictions Precautions Precautions: Fall Restrictions Weight Bearing Restrictions: No     Mobility  Bed Mobility Overal bed mobility: Needs Assistance Bed Mobility: Sit to Supine       Sit to supine: Min assist   General bed mobility comments: assist for RLE into bed    Transfers Overall transfer level: Needs assistance Equipment used: Rolling walker (2 wheels) Transfers: Sit to/from Stand Sit to Stand: From elevated surface, Contact guard  assist           General transfer comment: good hand placement, increased time/effort, used momentum    Ambulation/Gait Ambulation/Gait assistance: Contact guard assist Gait Distance (Feet): 80 Feet Assistive device: Rolling walker (2 wheels) Gait Pattern/deviations: Step-to pattern, Decreased step length - right, Decreased step length - left Gait velocity: decreased     General Gait Details: distance limited by fatigue, no loss of balance; pt took several standing rest breaks 2* BUE fatigue   Stairs Stairs: Yes Stairs assistance: Contact guard assist Stair Management: One rail Left, With cane, Forwards, Step to pattern Number of Stairs: 3 General stair comments: pt had good recall of correct sequening, no unsteadiness   Wheelchair Mobility     Tilt Bed    Modified Rankin (Stroke Patients Only)       Balance Overall balance assessment: Needs assistance, History of Falls Sitting-balance support: Feet supported Sitting balance-Leahy Scale: Good     Standing balance support: Bilateral upper extremity supported, During functional activity, Reliant on assistive device for balance Standing balance-Leahy Scale: Poor                              Cognition Arousal: Alert Behavior During Therapy: WFL for tasks assessed/performed Overall Cognitive Status: Within Functional Limits for tasks assessed  Exercises Total Joint Exercises Heel Slides: AAROM, Right, 10 reps, Supine Hip ABduction/ADduction: AAROM, Right, 10 reps, Supine Long Arc Quad: AROM, Right, 15 reps, Seated    General Comments        Pertinent Vitals/Pain Pain Assessment Pain Score: 6  Pain Location: R hip Pain Descriptors / Indicators: Sore, Operative site guarding Pain Intervention(s): Limited activity within patient's tolerance, Monitored during session, Repositioned, Premedicated before session, Ice applied    Home  Living                          Prior Function            PT Goals (current goals can now be found in the care plan section) Acute Rehab PT Goals Patient Stated Goal: to be able to exercise, walk on treadmill PT Goal Formulation: With patient Time For Goal Achievement: 10/14/23 Potential to Achieve Goals: Good Progress towards PT goals: Progressing toward goals    Frequency    7X/week      PT Plan      Co-evaluation              AM-PAC PT "6 Clicks" Mobility   Outcome Measure  Help needed turning from your back to your side while in a flat bed without using bedrails?: A Little Help needed moving from lying on your back to sitting on the side of a flat bed without using bedrails?: A Little Help needed moving to and from a bed to a chair (including a wheelchair)?: A Little Help needed standing up from a chair using your arms (e.g., wheelchair or bedside chair)?: A Little Help needed to walk in hospital room?: A Little Help needed climbing 3-5 steps with a railing? : A Little 6 Click Score: 18    End of Session Equipment Utilized During Treatment: Gait belt Activity Tolerance: Patient tolerated treatment well;Patient limited by fatigue Patient left: with call bell/phone within reach;in bed;with bed alarm set Nurse Communication: Mobility status PT Visit Diagnosis: Unsteadiness on feet (R26.81);Other abnormalities of gait and mobility (R26.89);Repeated falls (R29.6);Muscle weakness (generalized) (M62.81);History of falling (Z91.81);Difficulty in walking, not elsewhere classified (R26.2);Pain Pain - Right/Left: Right Pain - part of body: Hip     Time: 1610-9604 PT Time Calculation (min) (ACUTE ONLY): 30 min  Charges:    $Gait Training: 8-22 mins $Therapeutic Exercise: 8-22 mins PT General Charges $$ ACUTE PT VISIT: 1 Visit                     Tamala Ser PT 10/02/2023  Acute Rehabilitation Services  Office [phone]

## 2023-10-02 NOTE — Progress Notes (Signed)
Physical Therapy Treatment Patient Details Name: [name] MRN: [id number] DOB: [birth year] Today's Date: 10/02/2023   History of Present Illness 62 yo female presents to therapy s/p R THA, anterior approach on 09/30/2023 due to failure of conservative measures. Pt PMH includes but is not limited to: OA, HLD, depression, HTN, GERD, L shoulder impingement, CAD, PTSD, L THA, AA (2022) and R TKA (2020).    PT Comments  Pt completed stair training with her sister who will be primary caregiver present. Pt was steady on stairs, no loss of balance, and had good recall of correct sequencing. She is ready to DC home from a PT standpoint.     If plan is discharge home, recommend the following: A little help with walking and/or transfers;A little help with bathing/dressing/bathroom;Assistance with cooking/housework;Assist for transportation;Help with stairs or ramp for entrance   Can travel by private vehicle        Equipment Recommendations  None recommended by PT    Recommendations for Other Services       Precautions / Restrictions Precautions Precautions: Fall Restrictions Weight Bearing Restrictions: No     Mobility  Bed Mobility Overal bed mobility: Needs Assistance Bed Mobility: Supine to Sit     Supine to sit: Min assist Sit to supine: Min assist   General bed mobility comments: assist to raise trunk    Transfers Overall transfer level: Needs assistance Equipment used: Rolling walker (2 wheels) Transfers: Sit to/from Stand Sit to Stand: From elevated surface, Contact guard assist           General transfer comment: good hand placement    Ambulation/Gait Ambulation/Gait assistance: Modified independent (Device/Increase time) Gait Distance (Feet): 80 Feet Assistive device: Rolling walker (2 wheels) Gait Pattern/deviations: Step-to pattern, Decreased step length - right, Decreased step length - left Gait velocity: decreased     General Gait Details: distance  limited by fatigue, no loss of balance   Stairs Stairs: Yes Stairs assistance: Contact guard assist Stair Management: One rail Left, With cane, Forwards, Step to pattern Number of Stairs: 3 General stair comments: pt had good recall of correct sequencing, no unsteadiness, sister present   Wheelchair Mobility     Tilt Bed    Modified Rankin (Stroke Patients Only)       Balance Overall balance assessment: Needs assistance, History of Falls Sitting-balance support: Feet supported Sitting balance-Leahy Scale: Good     Standing balance support: Bilateral upper extremity supported, During functional activity, Reliant on assistive device for balance Standing balance-Leahy Scale: Poor                              Cognition Arousal: Alert Behavior During Therapy: WFL for tasks assessed/performed Overall Cognitive Status: Within Functional Limits for tasks assessed                                          Exercises Total Joint Exercises Heel Slides: AAROM, Right, 10 reps, Supine Hip ABduction/ADduction: AAROM, Right, 10 reps, Supine Long Arc Quad: AROM, Right, 15 reps, Seated    General Comments        Pertinent Vitals/Pain Pain Assessment Pain Score: 6  Pain Location: R hip Pain Descriptors / Indicators: Sore, Operative site guarding Pain Intervention(s): Limited activity within patient's tolerance, Monitored during session, Premedicated before session, Repositioned    Home Living  Prior Function            PT Goals (current goals can now be found in the care plan section) Acute Rehab PT Goals Patient Stated Goal: to be able to exercise, walk on treadmill PT Goal Formulation: With patient Time For Goal Achievement: 10/14/23 Potential to Achieve Goals: Good Progress towards PT goals: Progressing toward goals    Frequency    7X/week      PT Plan      Co-evaluation               AM-PAC PT "6 Clicks" Mobility   Outcome Measure  Help needed turning from your back to your side while in a flat bed without using bedrails?: A Little Help needed moving from lying on your back to sitting on the side of a flat bed without using bedrails?: A Little Help needed moving to and from a bed to a chair (including a wheelchair)?: None Help needed standing up from a chair using your arms (e.g., wheelchair or bedside chair)?: None Help needed to walk in hospital room?: None Help needed climbing 3-5 steps with a railing? : A Little 6 Click Score: 21    End of Session Equipment Utilized During Treatment: Gait belt Activity Tolerance: Patient tolerated treatment well;Patient limited by fatigue Patient left: with call bell/phone within reach;in chair;with family/visitor present Nurse Communication: Mobility status PT Visit Diagnosis: Unsteadiness on feet (R26.81);Other abnormalities of gait and mobility (R26.89);Repeated falls (R29.6);Muscle weakness (generalized) (M62.81);History of falling (Z91.81);Difficulty in walking, not elsewhere classified (R26.2);Pain Pain - Right/Left: Right Pain - part of body: Hip     Time: 4010-2725 PT Time Calculation (min) (ACUTE ONLY): 11 min  Charges:    $Gait Training: 8-22 mins PT General Charges $$ ACUTE PT VISIT: 1 Visit                    Tamala Ser PT 10/02/2023  Acute Rehabilitation Services  Office [phone]

## 2023-10-02 NOTE — Progress Notes (Signed)
   Subjective: 2 Days Post-Op Procedure(s) (LRB): TOTAL HIP ARTHROPLASTY ANTERIOR APPROACH (Right) Patient reports pain as mild.   Patient seen in rounds for Dr. Lequita Halt. Patient is feeling well this AM, resting in recliner. Reports she slept well and is ready to go home today. Did not do well enough with stair training for dc yesterday.  Objective: Vital signs in last 24 hours: Temp:  [98.3 F (36.8 C)-98.7 F (37.1 C)] 98.6 F (37 C) (12/06 0555) Pulse Rate:  [72-102] 102 (12/06 0555) Resp:  [15-17] 15 (12/06 0555) BP: (126-164)/(58-78) 137/58 (12/06 0555) SpO2:  [91 %-98 %] 93 % (12/06 0555)  Intake/Output from previous day:  Intake/Output Summary (Last 24 hours) at 10/02/2023 0744 Last data filed at 10/02/2023 0555 Gross per 24 hour  Intake 1140 ml  Output --  Net 1140 ml    Intake/Output this shift: No intake/output data recorded.  Labs: Recent Labs    10/01/23 0408 10/02/23 0321  HGB 10.4* 10.0*   Recent Labs    10/01/23 0408 10/02/23 0321  WBC 10.5 7.8  RBC 3.84* 3.62*  HCT 33.0* 31.6*  PLT 219 201   Recent Labs    10/01/23 0408  NA 140  K 4.5  CL 106  CO2 27  BUN 12  CREATININE 0.88  GLUCOSE 190*  CALCIUM 8.3*   No results for input(s): "LABPT", "INR" in the last 72 hours.  Exam: General - Patient is Alert and Oriented Extremity - Neurologically intact Neurovascular intact Sensation intact distally Dorsiflexion/Plantar flexion intact Dressing/Incision - clean, dry, no drainage Motor Function - intact, moving foot and toes well on exam.   Past Medical History:  Diagnosis Date   Anemia    Coronary artery disease    Mild by cath 2009   Diabetes mellitus without complication (HCC)    GERD (gastroesophageal reflux disease)    H/O hiatal hernia    History of carpal tunnel syndrome    Bilateral   History of iron deficiency anemia    History of MRSA infection    Hypertension    Impingement syndrome of left shoulder    Knee pain     r/knee   OA (osteoarthritis)    PTSD (post-traumatic stress disorder)    Severe major depression with psychotic features (HCC)     Assessment/Plan: 2 Days Post-Op Procedure(s) (LRB): TOTAL HIP ARTHROPLASTY ANTERIOR APPROACH (Right) Principal Problem:   OA (osteoarthritis) of hip Active Problems:   Primary osteoarthritis of right hip  Estimated body mass index is 41.51 kg/m as calculated from the following:   Height as of this encounter: 5\' 8"  (1.727 m).   Weight as of this encounter: 123.8 kg. Up with therapy  DVT Prophylaxis - Aspirin Weight-bearing as tolerated  Discharge to home with HEP after first session of PT this AM if meeting goals. F/u in the office in 2 weeks  Arther Abbott, PA-C Orthopedic Surgery [phone] 10/02/2023, 7:44 AM

## 2023-10-05 NOTE — Discharge Summary (Signed)
Patient ID: [name] MRN: [id number] DOB/AGE: 1961-08-17 62 y.o.  Admit date: 09/30/2023 Discharge date: 10/02/2023  Admission Diagnoses:  Principal Problem:   OA (osteoarthritis) of hip Active Problems:   Primary osteoarthritis of right hip   Discharge Diagnoses:  Same  Past Medical History:  Diagnosis Date   Anemia    Coronary artery disease    Mild by cath 2009   Diabetes mellitus without complication (HCC)    GERD (gastroesophageal reflux disease)    H/O hiatal hernia    History of carpal tunnel syndrome    Bilateral   History of iron deficiency anemia    History of MRSA infection    Hypertension    Impingement syndrome of left shoulder    Knee pain    r/knee   OA (osteoarthritis)    PTSD (post-traumatic stress disorder)    Severe major depression with psychotic features (HCC)     Surgeries: Procedure(s): TOTAL HIP ARTHROPLASTY ANTERIOR APPROACH on 09/30/2023   Consultants:   Discharged Condition: Improved  Hospital Course: [name] is an 62 y.o. female who was admitted 09/30/2023 for operative treatment ofOA (osteoarthritis) of hip. Patient has severe unremitting pain that affects sleep, daily activities, and work/hobbies. After pre-op clearance the patient was taken to the operating room on 09/30/2023 and underwent  Procedure(s): TOTAL HIP ARTHROPLASTY ANTERIOR APPROACH.    Patient was given perioperative antibiotics:  Anti-infectives (From admission, onward)    Start     Dose/Rate Route Frequency Ordered Stop   09/30/23 1800  ceFAZolin (ANCEF) IVPB 2g/100 mL premix        2 g 200 mL/hr over 30 Minutes Intravenous Every 6 hours 09/30/23 1538 09/30/23 2340   09/30/23 1000  ceFAZolin (ANCEF) IVPB 3g/100 mL premix        3 g 200 mL/hr over 30 Minutes Intravenous On call to O.R. 09/30/23 2952 09/30/23 1147        Patient was given sequential compression devices, early ambulation, and chemoprophylaxis to prevent DVT.  Patient benefited maximally  from hospital stay and there were no complications.    Recent vital signs: No data found.   Recent laboratory studies: No results for input(s): "WBC", "HGB", "HCT", "PLT", "NA", "K", "CL", "CO2", "BUN", "CREATININE", "GLUCOSE", "INR", "CALCIUM" in the last 72 hours.  Invalid input(s): "PT", "2"   Discharge Medications:   Allergies as of 10/02/2023       Reactions   Hydromorphone Nausea And Vomiting   Adhesive [tape] Other (See Comments)   Irritation and tears skin        Medication List     STOP taking these medications    aspirin EC 81 MG tablet Replaced by: aspirin 81 MG chewable tablet       TAKE these medications    amLODipine 10 MG tablet Commonly known as: NORVASC Take 5 mg by mouth daily.   aspirin 81 MG chewable tablet Chew 1 tablet (81 mg total) by mouth 2 (two) times daily for 20 days. Then resume an 81 mg aspirin once a day. Replaces: aspirin EC 81 MG tablet   atorvastatin 80 MG tablet Commonly known as: LIPITOR Take 80 mg by mouth at bedtime.   DULoxetine 30 MG capsule Commonly known as: CYMBALTA Take 30 mg by mouth 2 (two) times daily.   empagliflozin 25 MG Tabs tablet Commonly known as: JARDIANCE Take 12.5 mg by mouth daily.   fluticasone 50 MCG/ACT nasal spray Commonly known as: FLONASE Place 1-2 sprays into both  nostrils daily as needed for allergies or rhinitis.   gabapentin 600 MG tablet Commonly known as: NEURONTIN Take 600 mg by mouth 2 (two) times daily.   gabapentin 300 MG capsule Commonly known as: NEURONTIN Take 1 capsule (300 mg total) by mouth 2 (two) times daily.   HYDROcodone-acetaminophen 5-325 MG tablet Commonly known as: NORCO/VICODIN Take 1-2 tablets by mouth every 6 (six) hours as needed for severe pain (pain score 7-10).   hydrOXYzine 25 MG tablet Commonly known as: ATARAX Take 25 mg by mouth 2 (two) times daily as needed for anxiety.   melatonin 5 MG Tabs Take 5 mg by mouth at bedtime.   metFORMIN 500 MG 24  hr tablet Commonly known as: GLUCOPHAGE-XR Take 500 mg by mouth 2 (two) times daily with a meal.   methocarbamol 500 MG tablet Commonly known as: ROBAXIN Take 1 tablet (500 mg total) by mouth every 6 (six) hours as needed for muscle spasms.   omeprazole 40 MG capsule Commonly known as: PRILOSEC Take 40 mg by mouth in the morning and at bedtime.   ondansetron 4 MG tablet Commonly known as: Zofran Take 1 tablet (4 mg total) by mouth every 8 (eight) hours as needed for nausea or vomiting. What changed: Another medication with the same name was added. Make sure you understand how and when to take each.   ondansetron 4 MG tablet Commonly known as: ZOFRAN Take 1 tablet (4 mg total) by mouth every 6 (six) hours as needed for nausea. What changed: You were already taking a medication with the same name, and this prescription was added. Make sure you understand how and when to take each.   pantoprazole 40 MG tablet Commonly known as: PROTONIX Take 40 mg by mouth daily.   prazosin 5 MG capsule Commonly known as: MINIPRESS Take 5 mg by mouth at bedtime.   QUEtiapine 200 MG tablet Commonly known as: SEROQUEL Take 200 mg by mouth at bedtime.   QUEtiapine 50 MG tablet Commonly known as: SEROQUEL Take 50 mg by mouth daily.   sertraline 100 MG tablet Commonly known as: ZOLOFT Take 100 mg by mouth daily.   sucralfate 1 g tablet Commonly known as: CARAFATE Take 1 g by mouth 4 (four) times daily as needed (acid reflux).   traMADol 50 MG tablet Commonly known as: ULTRAM Take 1 tablet (50 mg total) by mouth 3 (three) times daily as needed for moderate pain (pain score 4-6).   verapamil 180 MG CR tablet Commonly known as: CALAN-SR Take 180 mg by mouth daily.               Discharge Care Instructions  (From admission, onward)           Start     Ordered   10/01/23 0000  Weight bearing as tolerated        10/01/23 0738   10/01/23 0000  Change dressing       Comments:  You have an adhesive waterproof bandage over the incision. Leave this in place until your first follow-up appointment. Once you remove this you will not need to place another bandage.   10/01/23 0738            Diagnostic Studies: DG HIP UNILAT WITH PELVIS 1V RIGHT  Result Date: 09/30/2023 CLINICAL DATA:  Tolerate hip arthroplasty. Intraoperative fluoroscopy. EXAM: DG HIP (WITH OR WITHOUT PELVIS) 1V RIGHT COMPARISON:  AP pelvis 12/19/2020 FINDINGS: Images were performed intraoperatively without the presence of a radiologist. Moderate  to severe right femoroacetabular joint space narrowing and peripheral osteophytosis. The patient is undergoing total right hip arthroplasty. Partial visualization of prior total left hip arthroplasty. No hardware complication is seen. Total fluoroscopy images: 4 Total fluoroscopy time: 12 seconds Total dose: Radiation Exposure Index (as provided by the fluoroscopic device): 2.32 mGy air Kerma Please see intraoperative findings for further detail. IMPRESSION: Intraoperative fluoroscopy for total right hip arthroplasty. Electronically Signed   By: Neita Garnet M.D.   On: 09/30/2023 14:35   DG C-Arm 1-60 Min-No Report  Result Date: 09/30/2023 Fluoroscopy was utilized by the requesting physician.  No radiographic interpretation.   DG C-Arm 1-60 Min-No Report  Result Date: 09/30/2023 Fluoroscopy was utilized by the requesting physician.  No radiographic interpretation.    Disposition: Discharge disposition: 01-Home or Self Care       Discharge Instructions     Call MD / Call 911   Complete by: As directed    If you experience chest pain or shortness of breath, CALL 911 and be transported to the hospital emergency room.  If you develope a fever above 101 F, pus (white drainage) or increased drainage or redness at the wound, or calf pain, call your surgeon's office.   Change dressing   Complete by: As directed    You have an adhesive waterproof bandage over  the incision. Leave this in place until your first follow-up appointment. Once you remove this you will not need to place another bandage.   Constipation Prevention   Complete by: As directed    Drink plenty of fluids.  Prune juice may be helpful.  You may use a stool softener, such as Colace (over the counter) 100 mg twice a day.  Use MiraLax (over the counter) for constipation as needed.   Diet - low sodium heart healthy   Complete by: As directed    Do not sit on low chairs, stoools or toilet seats, as it may be difficult to get up from low surfaces   Complete by: As directed    Driving restrictions   Complete by: As directed    No driving for two weeks   Post-operative opioid taper instructions:   Complete by: As directed    POST-OPERATIVE OPIOID TAPER INSTRUCTIONS: It is important to wean off of your opioid medication as soon as possible. If you do not need pain medication after your surgery it is ok to stop day one. Opioids include: Codeine, Hydrocodone(Norco, Vicodin), Oxycodone(Percocet, oxycontin) and hydromorphone amongst others.  Long term and even short term use of opiods can cause: Increased pain response Dependence Constipation Depression Respiratory depression And more.  Withdrawal symptoms can include Flu like symptoms Nausea, vomiting And more Techniques to manage these symptoms Hydrate well Eat regular healthy meals Stay active Use relaxation techniques(deep breathing, meditating, yoga) Do Not substitute Alcohol to help with tapering If you have been on opioids for less than two weeks and do not have pain than it is ok to stop all together.  Plan to wean off of opioids This plan should start within one week post op of your joint replacement. Maintain the same interval or time between taking each dose and first decrease the dose.  Cut the total daily intake of opioids by one tablet each day Next start to increase the time between doses. The last dose that  should be eliminated is the evening dose.      TED hose   Complete by: As directed    Use stockings (  TED hose) for three weeks on both leg(s).  You may remove them at night for sleeping.   Weight bearing as tolerated   Complete by: As directed         Follow-up Information     Aluisio, Homero Fellers, MD. Schedule an appointment as soon as possible for a visit in 2 week(s).   Specialty: Orthopedic Surgery Contact information: [address] [place] [address] [phone]                  Signed: Arther Abbott 10/05/2023, 8:58 AM

## 2023-12-14 ENCOUNTER — Other Ambulatory Visit: Payer: Self-pay | Admitting: Family

## 2023-12-14 DIAGNOSIS — E279 Disorder of adrenal gland, unspecified: Secondary | ICD-10-CM

## 2024-01-08 ENCOUNTER — Encounter: Payer: Self-pay | Admitting: Family

## 2024-01-11 ENCOUNTER — Other Ambulatory Visit

## 2024-10-25 ENCOUNTER — Telehealth: Payer: Self-pay

## 2024-10-25 NOTE — Telephone Encounter (Signed)
 Pt stated [facility] was supposed to have sent a referral for a sleep study to our office, I let the pt know we do not have a referral from the [facility]. Gave patient my name and fax # and let her know our altered hours for the holiday this week and if I did not receive anything from the [place] by Monday (1/5) I would reach back out.
